# Patient Record
Sex: Female | Born: 1985 | Race: Black or African American | Hispanic: No | Marital: Married | Smoking: Former smoker
Health system: Southern US, Community
[De-identification: ages and names within clinical notes are randomized; demographics above are authoritative.]

## PROBLEM LIST (undated history)

## (undated) DIAGNOSIS — O365990 Maternal care for other known or suspected poor fetal growth, unspecified trimester, not applicable or unspecified: Secondary | ICD-10-CM

## (undated) DIAGNOSIS — D649 Anemia, unspecified: Secondary | ICD-10-CM

## (undated) DIAGNOSIS — I10 Essential (primary) hypertension: Secondary | ICD-10-CM

## (undated) DIAGNOSIS — D259 Leiomyoma of uterus, unspecified: Secondary | ICD-10-CM

## (undated) DIAGNOSIS — O139 Gestational [pregnancy-induced] hypertension without significant proteinuria, unspecified trimester: Secondary | ICD-10-CM

## (undated) HISTORY — DX: Gestational (pregnancy-induced) hypertension without significant proteinuria, unspecified trimester: O13.9

## (undated) HISTORY — DX: Anemia, unspecified: D64.9

## (undated) HISTORY — DX: Maternal care for other known or suspected poor fetal growth, unspecified trimester, not applicable or unspecified: O36.5990

## (undated) HISTORY — DX: Leiomyoma of uterus, unspecified: D25.9

## (undated) HISTORY — DX: Maternal care for benign tumor of corpus uteri, unspecified trimester: D25.9

---

## 2009-06-07 ENCOUNTER — Inpatient Hospital Stay (HOSPITAL_COMMUNITY): Admission: AD | Admit: 2009-06-07 | Discharge: 2009-06-17 | Payer: Self-pay | Admitting: Obstetrics and Gynecology

## 2009-06-08 ENCOUNTER — Encounter: Payer: Self-pay | Admitting: Obstetrics and Gynecology

## 2009-06-09 ENCOUNTER — Encounter: Payer: Self-pay | Admitting: Obstetrics and Gynecology

## 2009-06-10 ENCOUNTER — Encounter (INDEPENDENT_AMBULATORY_CARE_PROVIDER_SITE_OTHER): Payer: Self-pay | Admitting: Obstetrics and Gynecology

## 2010-02-01 ENCOUNTER — Emergency Department (HOSPITAL_COMMUNITY): Admission: EM | Admit: 2010-02-01 | Discharge: 2010-02-01 | Payer: Self-pay | Admitting: Family Medicine

## 2010-03-10 ENCOUNTER — Emergency Department (HOSPITAL_COMMUNITY): Admission: EM | Admit: 2010-03-10 | Discharge: 2010-03-10 | Payer: Self-pay | Admitting: Family Medicine

## 2010-03-11 ENCOUNTER — Inpatient Hospital Stay (HOSPITAL_COMMUNITY): Admission: EM | Admit: 2010-03-11 | Discharge: 2010-03-12 | Payer: Self-pay | Admitting: Emergency Medicine

## 2010-04-20 ENCOUNTER — Emergency Department (HOSPITAL_COMMUNITY): Admission: EM | Admit: 2010-04-20 | Discharge: 2010-04-20 | Payer: Self-pay | Admitting: Emergency Medicine

## 2011-02-07 LAB — MRSA PCR SCREENING: MRSA by PCR: NEGATIVE

## 2011-02-07 LAB — CBC
HCT: 27.4 % — ABNORMAL LOW (ref 36.0–46.0)
HCT: 34.8 % — ABNORMAL LOW (ref 36.0–46.0)
Hemoglobin: 11.4 g/dL — ABNORMAL LOW (ref 12.0–15.0)
Hemoglobin: 9.2 g/dL — ABNORMAL LOW (ref 12.0–15.0)
MCHC: 33.8 g/dL (ref 30.0–36.0)
MCV: 81.4 fL (ref 78.0–100.0)
MCV: 81.9 fL (ref 78.0–100.0)
Platelets: 232 10*3/uL (ref 150–400)
Platelets: 288 10*3/uL (ref 150–400)
RBC: 3.36 MIL/uL — ABNORMAL LOW (ref 3.87–5.11)
RBC: 4.24 MIL/uL (ref 3.87–5.11)
RDW: 16.8 % — ABNORMAL HIGH (ref 11.5–15.5)
RDW: 17.1 % — ABNORMAL HIGH (ref 11.5–15.5)
WBC: 11.1 10*3/uL — ABNORMAL HIGH (ref 4.0–10.5)
WBC: 4.7 10*3/uL (ref 4.0–10.5)

## 2011-02-07 LAB — DIFFERENTIAL
Basophils Absolute: 0 10*3/uL (ref 0.0–0.1)
Basophils Absolute: 0 10*3/uL (ref 0.0–0.1)
Basophils Relative: 1 % (ref 0–1)
Eosinophils Absolute: 0.1 10*3/uL (ref 0.0–0.7)
Eosinophils Relative: 1 % (ref 0–5)
Lymphocytes Relative: 35 % (ref 12–46)
Lymphs Abs: 1.7 10*3/uL (ref 0.7–4.0)
Lymphs Abs: 2.2 10*3/uL (ref 0.7–4.0)
Monocytes Absolute: 0.6 10*3/uL (ref 0.1–1.0)
Monocytes Absolute: 0.7 10*3/uL (ref 0.1–1.0)
Monocytes Relative: 13 % — ABNORMAL HIGH (ref 3–12)
Monocytes Relative: 8 % (ref 3–12)
Neutro Abs: 8.1 10*3/uL — ABNORMAL HIGH (ref 1.7–7.7)
Neutrophils Relative %: 51 % (ref 43–77)
Neutrophils Relative %: 70 % (ref 43–77)
Neutrophils Relative %: 73 % (ref 43–77)

## 2011-02-07 LAB — BASIC METABOLIC PANEL
GFR calc non Af Amer: >60 mL/min (ref >60)
Potassium: 3.2 mEq/L — ABNORMAL LOW (ref 3.5–5.1)
Sodium: 136 mEq/L (ref 135–145)

## 2011-02-07 LAB — GLUCOSE, CAPILLARY: Glucose-Capillary: 103 mg/dL — ABNORMAL HIGH (ref 70–99)

## 2011-02-07 LAB — RAPID STREP SCREEN (MED CTR MEBANE ONLY): Streptococcus, Group A Screen (Direct): NEGATIVE

## 2011-02-07 LAB — POCT RAPID STREP A (OFFICE): Streptococcus, Group A Screen (Direct): NEGATIVE

## 2011-02-08 ENCOUNTER — Inpatient Hospital Stay (HOSPITAL_COMMUNITY)
Admission: AD | Admit: 2011-02-08 | Discharge: 2011-02-10 | DRG: 886 | Disposition: A | Discharge status: Home / Self Care | Payer: BC Managed Care – PPO | Source: Ambulatory Visit | Attending: Obstetrics & Gynecology | Admitting: Obstetrics & Gynecology

## 2011-02-08 DIAGNOSIS — O139 Gestational [pregnancy-induced] hypertension without significant proteinuria, unspecified trimester: Principal | ICD-10-CM | POA: Diagnosis present

## 2011-02-08 DIAGNOSIS — O34219 Maternal care for unspecified type scar from previous cesarean delivery: Secondary | ICD-10-CM | POA: Diagnosis present

## 2011-02-08 LAB — CBC
HCT: 30.1 % — ABNORMAL LOW (ref 36.0–46.0)
MCHC: 32.2 g/dL (ref 30.0–36.0)
Platelets: 204 10*3/uL (ref 150–400)
RDW: 13.8 % (ref 11.5–15.5)

## 2011-02-08 LAB — COMPREHENSIVE METABOLIC PANEL
ALT: 9 U/L (ref 0–35)
BUN: 5 mg/dL — ABNORMAL LOW (ref 6–23)
Calcium: 8.7 mg/dL (ref 8.4–10.5)
Glucose, Bld: 112 mg/dL — ABNORMAL HIGH (ref 70–99)
Sodium: 136 mEq/L (ref 135–145)
Total Protein: 6.2 g/dL (ref 6.0–8.3)

## 2011-02-08 LAB — LACTATE DEHYDROGENASE: LDH: 136 U/L (ref 94–250)

## 2011-02-08 LAB — URINALYSIS, ROUTINE W REFLEX MICROSCOPIC
Glucose, UA: NEGATIVE mg/dL
Nitrite: NEGATIVE
Specific Gravity, Urine: 1.025 (ref 1.005–1.030)
pH: 7 (ref 5.0–8.0)

## 2011-02-08 LAB — URINE MICROSCOPIC-ADD ON

## 2011-02-08 LAB — URIC ACID: Uric Acid, Serum: 4.8 mg/dL (ref 2.4–7.0)

## 2011-02-09 ENCOUNTER — Encounter (HOSPITAL_COMMUNITY): Payer: Self-pay | Admitting: Radiology

## 2011-02-09 ENCOUNTER — Inpatient Hospital Stay (HOSPITAL_COMMUNITY): Payer: BC Managed Care – PPO

## 2011-02-10 LAB — CREATININE CLEARANCE, URINE, 24 HOUR
Collection Interval-CRCL: 24 hours
Creatinine, Urine: 176.9 mg/dL

## 2011-02-10 LAB — PROTEIN, URINE, 24 HOUR: Protein, 24H Urine: 192 mg/d — ABNORMAL HIGH (ref 50–100)

## 2011-02-14 ENCOUNTER — Ambulatory Visit (HOSPITAL_COMMUNITY)
Admit: 2011-02-14 | Discharge: 2011-02-14 | Disposition: A | Discharge status: Home / Self Care | Payer: Medicaid Other | Attending: Obstetrics & Gynecology | Admitting: Obstetrics & Gynecology

## 2011-02-14 ENCOUNTER — Ambulatory Visit (HOSPITAL_COMMUNITY): Payer: Medicaid Other

## 2011-02-14 DIAGNOSIS — Z8751 Personal history of pre-term labor: Secondary | ICD-10-CM | POA: Insufficient documentation

## 2011-02-14 DIAGNOSIS — O139 Gestational [pregnancy-induced] hypertension without significant proteinuria, unspecified trimester: Secondary | ICD-10-CM | POA: Insufficient documentation

## 2011-02-14 DIAGNOSIS — O09299 Supervision of pregnancy with other poor reproductive or obstetric history, unspecified trimester: Secondary | ICD-10-CM | POA: Insufficient documentation

## 2011-02-16 NOTE — H&P (Signed)
  NAME:  Danielle Short, Danielle Short             ACCOUNT NO.:  [id number]  MEDICAL RECORD NO.:  [id number]           PATIENT TYPE:  I  LOCATION:  9174                          FACILITY:  [facility]  PHYSICIAN:  Roseanna Rainbow, M.D.DATE OF BIRTH:  1986-10-12  DATE OF ADMISSION:  02/08/2011 DATE OF DISCHARGE:                             HISTORY & PHYSICAL   CHIEF COMPLAINT:  The patient is a 25 year old, para 1 with an intrauterine pregnancy at 30 plus weeks' complaining of elevated blood pressures.    HISTORY OF PRESENT ILLNESS:  The patient reports having an episode of scotomata.  Using a home blood pressure cuff, her blood pressures were in the 150s/100s range.  The scotomata were short-lived.  There were no other neurologic symptoms.  She notes some facial swelling as well as extremity swelling. She denies any history of hypertension.  PAST MEDICAL HISTORY:  She denies.  PAST SURGICAL HISTORY:  Cesarean delivery.  PAST OBSTETRICAL HISTORY:  Please see the above, that pregnancy was complicated by preeclampsia.  SOCIAL HISTORY:  She denies any tobacco, ethanol or drug use.  PAST GYNECOLOGIC HISTORY:  Noncontributory.  FAMILY HISTORY:  Noncontributory.  REVIEW OF SYSTEMS:  GI:  She denies epigastric pain.  PULMONARY:  She denies shortness of breath.  NEUROLOGIC:  Please see the above. CARDIOVASCULAR:  Please see the above.  PHYSICAL EXAMINATION:  VITAL SIGNS:  Blood pressures 130s-150s/80s-90s, heart rate 90s-100, respirations 18, temperature 99.1.  Fetal heart tracing 140, moderate long-term variability.  Tocodynamometer no uterine contractions.  Please see the physical exam as per the mid-level provider.  ASSESSMENT:  Intrauterine pregnancy at 30 plus weeks' with pregnancy- induced hypertension.  The fetal heart tracing is consistent with fetal well-being, category one fetal heart tracing.  PLAN:  Admission, bedrest, steroid, complete obstetrical ultrasound for growth, daily  weights, PIH panel, monitor for signs and symptoms and the criteria for severe PIH.     Roseanna Rainbow, M.D.     Danielle Short  D:  02/09/2011  T:  02/09/2011  Job:  [id number]  Electronically Signed by Antionette Char M.D. on 02/16/2011 07:12:31 PM

## 2011-02-26 LAB — CBC
HCT: 32.6 % — ABNORMAL LOW (ref 36.0–46.0)
HCT: 33.9 % — ABNORMAL LOW (ref 36.0–46.0)
HCT: 34.8 % — ABNORMAL LOW (ref 36.0–46.0)
HCT: 36.7 % (ref 36.0–46.0)
HCT: 38.4 % (ref 36.0–46.0)
Hemoglobin: 11.1 g/dL — ABNORMAL LOW (ref 12.0–15.0)
Hemoglobin: 11.4 g/dL — ABNORMAL LOW (ref 12.0–15.0)
Hemoglobin: 11.5 g/dL — ABNORMAL LOW (ref 12.0–15.0)
Hemoglobin: 11.6 g/dL — ABNORMAL LOW (ref 12.0–15.0)
Hemoglobin: 12.1 g/dL (ref 12.0–15.0)
Hemoglobin: 13 g/dL (ref 12.0–15.0)
MCHC: 33.1 g/dL (ref 30.0–36.0)
MCHC: 33.8 g/dL (ref 30.0–36.0)
MCHC: 33.9 g/dL (ref 30.0–36.0)
MCHC: 34.2 g/dL (ref 30.0–36.0)
MCHC: 34.5 g/dL (ref 30.0–36.0)
MCV: 95.5 fL (ref 78.0–100.0)
MCV: 96.2 fL (ref 78.0–100.0)
MCV: 96.6 fL (ref 78.0–100.0)
MCV: 97.8 fL (ref 78.0–100.0)
MCV: 98.1 fL (ref 78.0–100.0)
Platelets: 180 K/uL (ref 150–400)
Platelets: 189 K/uL (ref 150–400)
Platelets: 198 K/uL (ref 150–400)
Platelets: 200 10*3/uL (ref 150–400)
Platelets: 201 10*3/uL (ref 150–400)
RBC: 3.41 MIL/uL — ABNORMAL LOW (ref 3.87–5.11)
RBC: 3.52 MIL/uL — ABNORMAL LOW (ref 3.87–5.11)
RBC: 3.55 MIL/uL — ABNORMAL LOW (ref 3.87–5.11)
RBC: 3.98 MIL/uL (ref 3.87–5.11)
RDW: 14.1 % (ref 11.5–15.5)
RDW: 14.3 % (ref 11.5–15.5)
RDW: 14.7 % (ref 11.5–15.5)
RDW: 14.7 % (ref 11.5–15.5)
RDW: 15.2 % (ref 11.5–15.5)
RDW: 15.6 % — ABNORMAL HIGH (ref 11.5–15.5)
WBC: 10.2 K/uL (ref 4.0–10.5)
WBC: 12 10*3/uL — ABNORMAL HIGH (ref 4.0–10.5)
WBC: 12.8 10*3/uL — ABNORMAL HIGH (ref 4.0–10.5)
WBC: 7 K/uL (ref 4.0–10.5)
WBC: 7.1 K/uL (ref 4.0–10.5)

## 2011-02-26 LAB — COMPREHENSIVE METABOLIC PANEL
ALT: 21 U/L (ref 0–35)
ALT: 22 U/L (ref 0–35)
AST: 35 U/L (ref 0–37)
AST: 36 U/L (ref 0–37)
Albumin: 2.4 g/dL — ABNORMAL LOW (ref 3.5–5.2)
Albumin: 2.5 g/dL — ABNORMAL LOW (ref 3.5–5.2)
Alkaline Phosphatase: 116 U/L (ref 39–117)
Alkaline Phosphatase: 119 U/L — ABNORMAL HIGH (ref 39–117)
Alkaline Phosphatase: 119 U/L — ABNORMAL HIGH (ref 39–117)
Alkaline Phosphatase: 120 U/L — ABNORMAL HIGH (ref 39–117)
BUN: 10 mg/dL (ref 6–23)
BUN: 11 mg/dL (ref 6–23)
BUN: 7 mg/dL (ref 6–23)
BUN: 8 mg/dL (ref 6–23)
BUN: 9 mg/dL (ref 6–23)
CO2: 20 mEq/L (ref 19–32)
CO2: 25 mEq/L (ref 19–32)
CO2: 26 mEq/L (ref 19–32)
Calcium: 6.7 mg/dL — ABNORMAL LOW (ref 8.4–10.5)
Calcium: 7.2 mg/dL — ABNORMAL LOW (ref 8.4–10.5)
Chloride: 103 mEq/L (ref 96–112)
Chloride: 104 mEq/L (ref 96–112)
Chloride: 104 mEq/L (ref 96–112)
Chloride: 105 mEq/L (ref 96–112)
Creatinine, Ser: 0.74 mg/dL (ref 0.4–1.2)
Creatinine, Ser: 0.76 mg/dL (ref 0.4–1.2)
Creatinine, Ser: 0.77 mg/dL (ref 0.4–1.2)
GFR calc Af Amer: >60 mL/min (ref >60)
GFR calc Af Amer: >60 mL/min (ref >60)
GFR calc non Af Amer: >60 mL/min (ref >60)
GFR calc non Af Amer: >60 mL/min (ref >60)
Glucose, Bld: 113 mg/dL — ABNORMAL HIGH (ref 70–99)
Glucose, Bld: 117 mg/dL — ABNORMAL HIGH (ref 70–99)
Glucose, Bld: 120 mg/dL — ABNORMAL HIGH (ref 70–99)
Glucose, Bld: 137 mg/dL — ABNORMAL HIGH (ref 70–99)
Glucose, Bld: 87 mg/dL (ref 70–99)
Potassium: 3.9 mEq/L (ref 3.5–5.1)
Potassium: 4.2 mEq/L (ref 3.5–5.1)
Potassium: 4.4 mEq/L (ref 3.5–5.1)
Potassium: 4.9 mEq/L (ref 3.5–5.1)
Sodium: 135 mEq/L (ref 135–145)
Sodium: 137 mEq/L (ref 135–145)
Total Bilirubin: 0.4 mg/dL (ref 0.3–1.2)
Total Bilirubin: 0.4 mg/dL (ref 0.3–1.2)
Total Bilirubin: 0.5 mg/dL (ref 0.3–1.2)
Total Bilirubin: 0.7 mg/dL (ref 0.3–1.2)
Total Protein: 5.3 g/dL — ABNORMAL LOW (ref 6.0–8.3)
Total Protein: 6.2 g/dL (ref 6.0–8.3)
Total Protein: 6.4 g/dL (ref 6.0–8.3)

## 2011-02-26 LAB — URINALYSIS, ROUTINE W REFLEX MICROSCOPIC
Bilirubin Urine: NEGATIVE
Glucose, UA: NEGATIVE mg/dL
Hgb urine dipstick: NEGATIVE
Ketones, ur: NEGATIVE mg/dL
Leukocytes, UA: NEGATIVE
Nitrite: NEGATIVE
Protein, ur: 100 mg/dL — AB
Specific Gravity, Urine: 1.025 (ref 1.005–1.030)
Urobilinogen, UA: 0.2 mg/dL (ref 0.0–1.0)
pH: 6 (ref 5.0–8.0)

## 2011-02-26 LAB — MAGNESIUM
Magnesium: 4.7 mg/dL — ABNORMAL HIGH (ref 1.5–2.5)
Magnesium: 6.3 mg/dL (ref 1.5–2.5)
Magnesium: 8.3 mg/dL (ref 1.5–2.5)

## 2011-02-26 LAB — BASIC METABOLIC PANEL
BUN: 15 mg/dL (ref 6–23)
CO2: 27 mEq/L (ref 19–32)
CO2: 32 mEq/L (ref 19–32)
Calcium: 9.1 mg/dL (ref 8.4–10.5)
Chloride: 105 mEq/L (ref 96–112)
Chloride: 99 mEq/L (ref 96–112)
Creatinine, Ser: 0.66 mg/dL (ref 0.4–1.2)
Creatinine, Ser: 0.79 mg/dL (ref 0.4–1.2)
Glucose, Bld: 83 mg/dL (ref 70–99)
Glucose, Bld: 84 mg/dL (ref 70–99)
Potassium: 4.5 mEq/L (ref 3.5–5.1)

## 2011-02-26 LAB — URIC ACID
Uric Acid, Serum: 6.6 mg/dL (ref 2.4–7.0)
Uric Acid, Serum: 6.7 mg/dL (ref 2.4–7.0)
Uric Acid, Serum: 6.8 mg/dL (ref 2.4–7.0)
Uric Acid, Serum: 6.9 mg/dL (ref 2.4–7.0)
Uric Acid, Serum: 7 mg/dL (ref 2.4–7.0)
Uric Acid, Serum: 7.5 mg/dL — ABNORMAL HIGH (ref 2.4–7.0)

## 2011-02-26 LAB — CREATININE CLEARANCE, URINE, 24 HOUR
Collection Interval-CRCL: 24 h
Creatinine Clearance: 146 mL/min — ABNORMAL HIGH (ref 75–115)
Creatinine, 24H Ur: 1595 mg/d (ref 700–1800)
Creatinine, Urine: 43.4 mg/dL
Creatinine: 0.76 mg/dL (ref 0.40–1.20)
Urine Total Volume-CRCL: 3675 mL

## 2011-02-26 LAB — RPR: RPR Ser Ql: NONREACTIVE

## 2011-02-26 LAB — LACTATE DEHYDROGENASE
LDH: 171 U/L (ref 94–250)
LDH: 190 U/L (ref 94–250)
LDH: 249 U/L (ref 94–250)

## 2011-02-26 LAB — STREP B DNA PROBE: Strep Group B Ag: POSITIVE

## 2011-02-26 LAB — PROTEIN, URINE, 24 HOUR
Collection Interval-UPROT: 24 h
Protein, 24H Urine: 1139 mg/d — ABNORMAL HIGH (ref 50–100)
Protein, Urine: 31 mg/dL
Urine Total Volume-UPROT: 3675 mL

## 2011-02-26 LAB — URINE MICROSCOPIC-ADD ON

## 2011-03-06 ENCOUNTER — Inpatient Hospital Stay (HOSPITAL_COMMUNITY)
Admission: AD | Admit: 2011-03-06 | Discharge: 2011-03-07 | Disposition: A | Discharge status: Home / Self Care | Payer: Medicaid Other | Source: Ambulatory Visit | Attending: Obstetrics & Gynecology | Admitting: Obstetrics & Gynecology

## 2011-03-06 DIAGNOSIS — O4690 Antepartum hemorrhage, unspecified, unspecified trimester: Secondary | ICD-10-CM | POA: Insufficient documentation

## 2011-03-15 ENCOUNTER — Other Ambulatory Visit: Payer: Self-pay | Admitting: Obstetrics

## 2011-03-15 DIAGNOSIS — O139 Gestational [pregnancy-induced] hypertension without significant proteinuria, unspecified trimester: Secondary | ICD-10-CM

## 2011-03-21 ENCOUNTER — Ambulatory Visit (HOSPITAL_COMMUNITY)
Admission: RE | Admit: 2011-03-21 | Discharge: 2011-03-21 | Disposition: A | Discharge status: Home / Self Care | Payer: Medicaid Other | Source: Ambulatory Visit | Attending: Obstetrics | Admitting: Obstetrics

## 2011-03-21 ENCOUNTER — Other Ambulatory Visit: Payer: Self-pay | Admitting: Obstetrics

## 2011-03-21 ENCOUNTER — Other Ambulatory Visit (HOSPITAL_COMMUNITY): Payer: Medicaid Other

## 2011-03-21 ENCOUNTER — Inpatient Hospital Stay (HOSPITAL_COMMUNITY)
Admission: AD | Admit: 2011-03-21 | Discharge: 2011-03-27 | DRG: 766 | Disposition: A | Discharge status: Home / Self Care | Payer: Medicaid Other | Source: Ambulatory Visit | Attending: Obstetrics | Admitting: Obstetrics

## 2011-03-21 DIAGNOSIS — O34219 Maternal care for unspecified type scar from previous cesarean delivery: Secondary | ICD-10-CM

## 2011-03-21 DIAGNOSIS — O09299 Supervision of pregnancy with other poor reproductive or obstetric history, unspecified trimester: Secondary | ICD-10-CM | POA: Insufficient documentation

## 2011-03-21 DIAGNOSIS — O139 Gestational [pregnancy-induced] hypertension without significant proteinuria, unspecified trimester: Secondary | ICD-10-CM

## 2011-03-21 DIAGNOSIS — O1414 Severe pre-eclampsia complicating childbirth: Principal | ICD-10-CM | POA: Diagnosis present

## 2011-03-21 DIAGNOSIS — Z8751 Personal history of pre-term labor: Secondary | ICD-10-CM

## 2011-03-22 LAB — CBC
HCT: 32.9 % — ABNORMAL LOW (ref 36.0–46.0)
Hemoglobin: 10.3 g/dL — ABNORMAL LOW (ref 12.0–15.0)
MCHC: 31.3 g/dL (ref 30.0–36.0)
MCV: 85 fL (ref 78.0–100.0)
RDW: 16 % — ABNORMAL HIGH (ref 11.5–15.5)

## 2011-03-22 LAB — URINALYSIS, DIPSTICK ONLY
Glucose, UA: NEGATIVE mg/dL
Ketones, ur: NEGATIVE mg/dL
Leukocytes, UA: NEGATIVE
Nitrite: NEGATIVE
Protein, ur: 100 mg/dL — AB
Urobilinogen, UA: 0.2 mg/dL (ref 0.0–1.0)

## 2011-03-22 LAB — COMPREHENSIVE METABOLIC PANEL
ALT: 7 U/L (ref 0–35)
Alkaline Phosphatase: 165 U/L — ABNORMAL HIGH (ref 39–117)
BUN: 11 mg/dL (ref 6–23)
CO2: 24 mEq/L (ref 19–32)
Calcium: 9.4 mg/dL (ref 8.4–10.5)
GFR calc non Af Amer: >60 mL/min (ref >60)
Glucose, Bld: 88 mg/dL (ref 70–99)
Potassium: 4.4 mEq/L (ref 3.5–5.1)
Sodium: 135 mEq/L (ref 135–145)
Total Protein: 6.2 g/dL (ref 6.0–8.3)

## 2011-03-22 LAB — URIC ACID: Uric Acid, Serum: 6.2 mg/dL (ref 2.4–7.0)

## 2011-03-22 LAB — LACTATE DEHYDROGENASE: LDH: 179 U/L (ref 94–250)

## 2011-03-23 LAB — CREATININE CLEARANCE, URINE, 24 HOUR
Creatinine Clearance: 112 mL/min (ref 75–115)
Creatinine, 24H Ur: 1549 mg/d (ref 700–1800)
Creatinine, Urine: 54.94 mg/dL
Creatinine: 0.96 mg/dL (ref 0.4–1.2)

## 2011-03-23 LAB — PROTEIN, URINE, 24 HOUR: Urine Total Volume-UPROT: 2820 mL

## 2011-03-24 ENCOUNTER — Other Ambulatory Visit: Payer: Self-pay | Admitting: Obstetrics

## 2011-03-24 ENCOUNTER — Other Ambulatory Visit (HOSPITAL_COMMUNITY): Payer: Medicaid Other

## 2011-03-24 LAB — SAMPLE TO BLOOD BANK

## 2011-03-24 LAB — CBC
MCV: 84.4 fL (ref 78.0–100.0)
Platelets: 199 10*3/uL (ref 150–400)
RBC: 3.9 MIL/uL (ref 3.87–5.11)
RDW: 16.5 % — ABNORMAL HIGH (ref 11.5–15.5)
WBC: 7.7 10*3/uL (ref 4.0–10.5)

## 2011-03-25 LAB — CBC
MCV: 85 fL (ref 78.0–100.0)
Platelets: 192 10*3/uL (ref 150–400)
RBC: 3.41 MIL/uL — ABNORMAL LOW (ref 3.87–5.11)
RDW: 16.5 % — ABNORMAL HIGH (ref 11.5–15.5)
WBC: 7.2 10*3/uL (ref 4.0–10.5)

## 2011-03-26 LAB — RPR: RPR Ser Ql: NONREACTIVE

## 2011-03-28 ENCOUNTER — Other Ambulatory Visit (HOSPITAL_COMMUNITY): Payer: Medicaid Other

## 2011-03-28 ENCOUNTER — Inpatient Hospital Stay (HOSPITAL_COMMUNITY): Admission: RE | Admit: 2011-03-28 | Payer: Medicaid Other | Source: Ambulatory Visit

## 2011-03-28 ENCOUNTER — Ambulatory Visit (HOSPITAL_COMMUNITY): Payer: Medicaid Other

## 2011-03-30 NOTE — Discharge Summary (Signed)
NAME:  Danielle Short, Danielle Short             ACCOUNT NO.:  [id number]  MEDICAL RECORD NO.:  [id number]           PATIENT TYPE:  I  LOCATION:  9121                          [facility]ILITY:  [facility]  PHYSICIAN:  Aurielle Slingerland A. Clearance Coots, M.D.DATE OF BIRTH:  1985/12/30  DATE OF ADMISSION:  03/21/2011 DATE OF DISCHARGE:  03/27/2011                              DISCHARGE SUMMARY   ADMITTING DIAGNOSES: 1. 36 weeks' gestation. 2. Pregnancy-induced hypertension.  DISCHARGE DIAGNOSES: 1. 36 weeks' gestation. 2. Pregnancy-induced hypertension. 3. Superimposed preeclampsia, severe. 4. Status post primary low-transverse cesarean section on Mar 24, 2011,     for severe preeclampsia.  Viable female delivered at 12:19, Apgars     of 8 at one minute and 9 at five minutes, weight of 5 pounds 14     ounces.  Mother and infant discharged home in good condition.  REASON FOR ADMISSION:  A 25 year old G36, P1 estimated date of confinement Apr 14, 2011, presents with increased blood pressure.  Her sister had taken her blood pressure at home and the reading was 170/120. The patient is on Aldomet for elevated blood pressures during pregnancy and there is some question of compliance.  Previous obstetrical history is significant for delivery at 27 weeks with preeclampsia and she has had a therapeutic abortion, that 27-week delivery was by cesarean section.  The patient was not told that there are any contraindications to vaginal delivery with this birth  PAST MEDICAL HISTORY/SURGERY:  Cesarean section and therapeutic abortion.  ILLNESSES:  Anemia.  MEDICATIONS:  Prenatal vitamins and Aldomet.  ALLERGIES:  No known drug allergies.  SOCIAL HISTORY:  Single.  Negative tobacco, alcohol, or recreational drug use.  PHYSICAL EXAMINATION:  VITAL SIGNS:  Afebrile, vital signs were stable with blood pressures 160/130, repeat blood pressure 150/106. LUNGS:  Clear to auscultation bilaterally. HEART:  Regular rate and  rhythm. ABDOMEN:  Gravid, nontender. GU:  Cervix is long, closed, and vertex at -3 station.  ADMITTING LABS:  Hemoglobin 10, hematocrit 32, white blood cell count 6000, platelets 200,000.  Sodium 135, potassium 4.4, BUN 11, creatinine 0.97, SGOT 16, SGPT 7, uric acid 6.2, LDH 179.  HOSPITAL COURSE:  The patient was admitted and placed on magnesium sulfate for seizure prophylaxis.  Labetalol was started for blood pressure management.  The patient had labile blood pressures throughout her hospital course, which worsened by hospital day #3.  At this point, she was 37 weeks' gestation.  Maternal Fetal Medicine consultation was obtained, which recommended delivery if the 24-hour urine total protein was greater than 300.  The 24-hour urine results were concluded by hospital day #2 and the total protein over 24 hours was 328.  Per Maternal Fetal Medicine, Dr. Pamella Pert recommendation, decision was made to proceed with cesarean section delivery for severe preeclampsia at 37 weeks with unstable blood pressures.  Primary low-transverse cesarean section was performed on Mar 24, 2011.  There were no intraoperative complications.  Postoperative course was uncomplicated.  The patient did have anemia postoperatively, but her baseline hemoglobin was borderline anemic and she had no orthostatic changes of dizziness or lightheadedness or headaches with ambulation.  She was therefore started  on iron therapy.  The patient was discharged home on postop day #3 in good condition.  DISCHARGE LABS:  Hemoglobin 8.8, hematocrit 29, white blood cell count 7200, platelets 192,000.  DISCHARGE DISPOSITION:  Continue prenatal vitamins.  Percocet and ibuprofen was prescribed for pain.  Continue labetalol.  The patient is to follow up in the office in 3 days for reevaluation of blood pressure and incision removal of staples.     Torben Soloway A. Clearance Coots, M.D.     CAH/MEDQ  D:  03/27/2011  T:  03/27/2011  Job:   [id number]  Electronically Signed by Coral Ceo M.D. on 03/30/2011 09:23:18 AM

## 2011-03-30 NOTE — Op Note (Signed)
NAME:  Danielle Short, Danielle Short             ACCOUNT NO.:  [id number]  MEDICAL RECORD NO.:  [id number]           PATIENT TYPE:  I  LOCATION:  9159                          [facility]ILITY:  [facility]  PHYSICIAN:  Charles A. Clearance Coots, M.D.DATE OF BIRTH:  [birth year]  DATE OF PROCEDURE:  03/24/2011 DATE OF DISCHARGE:                              OPERATIVE REPORT   PREOPERATIVE DIAGNOSES: 1. 37 weeks' gestation. 2. Severe preeclampsia. 3. Previous cesarean section.  POSTOPERATIVE DIAGNOSES: 1. 37 weeks' gestation. 2. Severe preeclampsia. 3. Previous cesarean section.  PROCEDURE:  Repeat low-transverse cesarean section.  SURGEON:  Charles A. Clearance Coots, MD  ASSISTANT:  Roseanna Rainbow, MD  ANESTHESIA:  Spinal.  ESTIMATED BLOOD LOSS:  800 mL.  IV FLUIDS:  1900 mL.  URINE OUTPUT:  200 mL clear.  COMPLICATIONS:  None.  Foley to gravity.  FINDINGS:  Viable female at 12:19, Apgars of 8 at one minute and 9 at five minutes, weight of 5 pounds and 14 ounces.  Normal uterus, ovaries, and fallopian tubes.  SPECIMEN:  Placenta.  DISPOSITION:  Specimen to Pathology.  OPERATION:  The patient was brought to the operating room after satisfactory spinal anesthesia and with indwelling Foley catheter in place.  The abdomen was prepped and draped in usual sterile fashion.  A Pfannenstiel skin incision was made with a scalpel that was deepened down to the fascia with a scalpel.  The fascia was nicked in the midline and the fascial incision was extended to the left and then to the right with curved Mayo scissors.  The superior and inferior fascial edges were taken off the rectus muscles both with blunt and sharp dissection.  The rectus muscle was sharply divided in the midline being careful to avoid the urinary bladder inferiorly.  The peritoneum was grasped with Kocher forceps and was incised with scalpel.  The peritoneal incision was then extended superiorly and inferiorly with Metzenbaum scissors.  The  Alexis retractor was then placed in the incision.  The vesicouterine fold of peritoneum above the reflection of the urinary bladder was grasped with forceps and was incised and undermined with Metzenbaum scissors.  The incision was extended to left and to the right with Metzenbaum scissors. The bladder flap was then bluntly developed.  The uterus was then entered transversely in the lower uterine segment with the scalpel down to the amniotic sac.  The uterine incision was then extended to the left and to the right with the bandage scissors.  The amniotic sac was then ruptured with hemostat, clear amniotic fluid was expelled.  The vertex was noted to be hyperextended and could not be delivered easily with extension, with further flexion and fundal pressure, a Mityvac Mushroom cap was then placed on the occiput and the vertex was then flexed into the incision and delivered with one pull of the vacuum extractor and the aid of fundal pressure from the assistant.  The infant's mouth and nose were suctioned with a suction bulb and the delivery was completed with the aid of fundal pressure from the assistant.  Umbilical cord was doubly clamped and cut and the infant was handed off to the nursery  staff.  The placenta was then spontaneously expelled from the uterine cavity intact.  The endometrial surface was thoroughly debrided with a dry lap sponge.  The edges of the uterine incision were grasped with ring forceps and the uterus was closed with a continuous interlocking suture of 0-Monocryl.  Hemostasis was excellent.  Pelvic cavity was thoroughly irrigated with warm saline solution and all clots were removed.  The Alexis retractor was then removed.  The parietal peritoneum was grasped with Digeronimo forceps and the parietal peritoneum was closed with a continuous suture of 2-0 Monocryl.  The fascia was closed with a continuous suture of 0-Vicryl from each corner to the center.  Subcutaneous tissue  was thoroughly irrigated with warm saline solution and all areas of subcutaneous bleeding were coagulated with Bovie.  Skin was then closed with surgical stainless steel staples. Sterile bandage was applied to the incision in closure.  The surgical technician indicated that all needle, sponge, and instrument counts were correct x2.  The patient tolerated the procedure well, was transported to the recovery room in satisfactory condition.     Charles A. Clearance Coots, M.D.     CAH/MEDQ  D:  03/24/2011  T:  03/25/2011  Job:  [id number]  Electronically Signed by Coral Ceo M.D. on 03/30/2011 09:23:11 AM

## 2011-03-31 ENCOUNTER — Other Ambulatory Visit (HOSPITAL_COMMUNITY): Payer: Medicaid Other

## 2011-03-31 ENCOUNTER — Inpatient Hospital Stay (HOSPITAL_COMMUNITY)
Admission: AD | Admit: 2011-03-31 | Discharge: 2011-03-31 | Disposition: A | Discharge status: Home / Self Care | Payer: Medicaid Other | Source: Ambulatory Visit | Attending: Obstetrics | Admitting: Obstetrics

## 2011-03-31 DIAGNOSIS — O900 Disruption of cesarean delivery wound: Secondary | ICD-10-CM | POA: Insufficient documentation

## 2011-04-04 NOTE — Op Note (Signed)
NAME:  Danielle Short, Danielle Short             ACCOUNT NO.:  [id number]   MEDICAL RECORD NO.:  [id number]          PATIENT TYPE:  OUT   LOCATION:  MFM                           [facility]   PHYSICIAN:  Janine Limbo, M.D.DATE OF BIRTH:  [birth year]   DATE OF PROCEDURE:  06/10/2009  DATE OF DISCHARGE:                               OPERATIVE REPORT   PREOPERATIVE DIAGNOSES:  1. A 27-week and 2-day gestation.  2. Severe preeclampsia.   POSTOPERATIVE DIAGNOSES:  1. A 27-week and 2-day gestation.  2. Severe preeclampsia.  3. Fibroid uterus.   PROCEDURE:  Primary low transverse cesarean section.   SURGEON:  Janine Limbo, MD   FIRST ASSISTANT:  Renaldo Reel. Emilee Hero, CNM   ANESTHETIC:  Spinal.   DISPOSITION:  Danielle Short is a 25 year old female, gravida 1, para 0, who  was admitted to the [facility] on June 07, 2009.  At  the time of admission, she was 26 weeks and 5 days gestation.  She has  been followed at the [facility]  of [facility].  The patient developed severe  hypertension and then was noted to be severely preeclamptic.  She was  given 48 hours of betamethasone.  Her blood pressures were difficult to  control, but eventually the patient had reasonable blood pressures with  labetalol tablets and IV Apresoline.  However, on the morning of June 10, 2009, the patient was noted to have blood pressures that were more  difficult to control.  She was noted to have an elevation in her liver  enzymes.  The decision was made to proceed with delivery.  The patient  had had several contractions overnight and the contractions were  accompanied by late decelerations on the fetal heart monitor.  The  decision was made to recommend a primary cesarean delivery.  We reviewed  the indications for the surgical procedure with the patient and the  father of the baby.  We also discussed the risk associated with her  surgical procedure.  Those risks include, but are not limited to,  anesthetic complications, bleeding, infection, and possible damage to  the surrounding organs.   FINDINGS:  A 730-g female infant Danielle Short) was delivered from a  cephalic presentation.  The Apgar scores were 7 at 1 minute and 8 at 5  minutes.  The arterial cord blood pH was 7.32.  There was a 1-cm fibroid  present on the left lower uterine segment.  The fallopian tubes and the  ovaries appeared normal.  The placenta was fibrotic.   PROCEDURE IN DETAILS:  The patient was taken to the operating room where  a spinal anesthetic was given.  The patient's abdomen, perineum, and  vagina were prepped with multiple layers of Betadine.  The bladder was  drained with a Foley catheter.  The patient was then sterilely draped.  The lower abdomen was injected with 10 mL of 0.5% Marcaine with  epinephrine.  A low transverse incision was made in the abdomen and  carried sharply through the subcutaneous tissue, the fascia, and  the  anterior peritoneum.  An incision was made in the lower uterine segment  and the incision was extended in a low transverse fashion.  The lower  uterine segment was noted to be thickened.  The fetal head was delivered  without difficulty.  The mouth and nose were suctioned.  The remainder  of the infant was then delivered.  The cord was clamped and cut and the  infant was handed to the awaiting pediatric team.  Routine cord blood  studies were obtained.  The placenta was removed.  The uterine cavity  was cleaned of amniotic fluid, clotted blood, and membranes.  The  uterine incision was closed using a running locking suture of 2-0 Vicryl  followed by an imbricating suture of 2-0 Vicryl.  The pelvis was  vigorously irrigated.  Hemostasis was adequate.  The anterior peritoneum  and the abdominal musculature were reapproximated in the midline using 3-  0 Vicryl.  The fascia was closed using a running suture of 0  Vicryl,  followed by 3 interrupted sutures of 0 Vicryl.  Subcutaneous layer was  closed using a running suture of 3-0 Vicryl.  The skin was  reapproximated using a subcuticular suture of 3-0 Monocryl.  Sponge,  needle, and instrument counts were correct on 2 occasions.  The  estimated blood loss for the procedure was 800 mL.  The patient  tolerated her procedure well.  The patient was taken to the recovery  room and then to the Adult Intensive Care Unit for close monitoring.  The infant was taken to the Neonatal Intensive Care Unit for monitoring.  The placenta was sent to Pathology.      Janine Limbo, M.D.  Electronically Signed     AVS/MEDQ  D:  06/10/2009  T:  06/11/2009  Job:  [id number]

## 2011-04-04 NOTE — H&P (Signed)
NAME:  Danielle Short, Danielle Short             ACCOUNT NO.:  [id number]   MEDICAL RECORD NO.:  [id number]          PATIENT TYPE:  INP   LOCATION:  9152                          [facility]   PHYSICIAN:  Crist Fat. Rivard, M.D. DATE OF BIRTH:  [birth year]   DATE OF ADMISSION:  06/07/2009  DATE OF DISCHARGE:                              HISTORY & PHYSICAL   A 25 year old gravida 1, para 0 with [facility] September 08, 2009 presented from  the office for evaluation of elevated blood pressure.  Blood pressure is  130/90 in the office to 140/100.  The patient is 26-6/7 days.  Repeated  blood pressure in MAU was 140/100 and she was sent to MAU for workup and  now she is admitted.   LABORATORY DATA:  Hemoglobin 11.1, hematocrit 32.6, total protein 5.7,  AST, SGOT 2022; ALT, SGPT 9, uric acid 6.1, platelets 180, glucose 113,  alkaline phosphatase 116.   Blood pressure since admission 179/123, initially blood pressure  186/129, blood pressure 179/125, 199/129, 195/142.  She is A positive.  Antibody screen negative.  Sickle cell negative.  VDRL nonreactive.  Rubella immune.  HBsAg negative.  HIV negative.  Pap within normal  limits.  GC, CT negative.   PAST MEDICAL HISTORY:  Denies diabetes, asthma, epilepsy, heart disease.  She does have history of Chlamydia.   ALLERGIES:  No known drug allergies.   PAST SURGICAL HISTORY:  Negative.   PAST OB HISTORY:  Menses 25 years old, every 28 days, lasting 5-6 days.  Her positive pregnancy test was January 01, 2009.   SOCIAL HISTORY:  She is single.  Denies drugs, alcohol.  Works in [employer]  and she stopped smoking this pregnancy.   FAMILY HISTORY:  Hypertension.  Her mother, her maternal aunt all are  diabetic.  Hypertensive diabetes in mother, also on dialysis three times  a week, and has sickle cell trait.   PHYSICAL EXAMINATION:  VITAL SIGNS:  Blood pressure 199/125 to 179/123.  HEART:  Regular rate without murmur.  LUNG:  Clear bilaterally.  ABDOMEN:   Gravid.  Fetal heart tone 130-140, 140-150 variably minimal.  No accelerations due to gestational age.  GENITOURINARY:  Vaginal exam, did not check.  EXTREMITIES:  The patient's reflexes +2.  No edema.  Noted 2 beats of  clonus.   ASSESSMENT:  1. Intrauterine pregnancy at 26-6/7.  2. Severe preeclampsia, admit to Dr. Estanislado Pandy.  Begin magnesium sulfate      4 g bolus and 2 g / hour maintenance.  3. Labetalol 20 mg IV push with order.  4. Betamethasone 12.5, repeat in 24 hours, Foley to bedside drainage,      Dr. Estanislado Pandy notified of admission and orders received and at 7:15      Dr. Estanislado Pandy is in MAU evaluating the patient.      Jasmine Awe, CNM      Dois Davenport A. Rivard, M.D.  Electronically Signed    JM/MEDQ  D:  06/07/2009  T:  06/08/2009  Job:  [id number]

## 2011-04-07 NOTE — Discharge Summary (Signed)
NAME:  Danielle Short, Danielle Short             ACCOUNT NO.:  [id number]   MEDICAL RECORD NO.:  [id number]          PATIENT TYPE:  INP   LOCATION:  9305                          FACILITY:  [facility]   PHYSICIAN:  Danielle Short, M.D.DATE OF BIRTH:  1986-02-08   DATE OF ADMISSION:  06/07/2009  DATE OF DISCHARGE:  06/17/2009                               DISCHARGE SUMMARY   ADMITTING DIAGNOSES:  1. Intrauterine pregnancy at 66 and 6/7 weeks.  2. Severe preeclampsia.  3. Life stressors.   DISCHARGE DIAGNOSES:  1. Intrauterine pregnancy at 61 and 2/7 weeks.  2. Preterm delivery at 27 and 2/7 weeks secondary to worsening      preeclampsia.  3. Status post a primary low transverse cesarean section with findings      of a viable female infant born on June 10, 2007 11:07 a.m., female      named, Danielle Short, with Apgars of 7 at 1 minute and 8 at 5      minutes, and cord pH equal to 7.32.  4. GBS positive.  5. Fibroid uterus.  6. Oligohydramnios diagnosed on June 07, 2009.  7. Postpartum hypertension.  8. Continued life stressors.   HOSPITAL PROCEDURES:  1. Magnesium sulfate infusion.  2. Hypertension management.  3. Betamethasone course.  4. Primary low transverse cesarean section.  5. Spinal anesthesia.  6. IV Lasix for diuresis.   CONSULTATIONS:  1. Maternal Fetal Medicine.  2. Nutrition.  3. NICU consult.  4. Social Work.  5. Lactation.  6. Pharmacy.   HOSPITAL COURSE:  Ms. Danielle Short is a 25 year old gravida 1, para 0, who  was sent from the office to [facility] for evaluation of  elevated blood pressures.  She was 26 and 6/7 weeks per an [facility] of  September 08, 2009, blood pressure at the office had been 130/90 as well  as 140/100.  She was without any other PIH signs or symptoms, and after  arriving from the office, her initial blood pressure in MAU was 140/100,  and the following that, blood pressure 199/125 and 179/123.  Following  that, blood pressure 186/129, 179/125,  199/129, and 195/142.  While in  maternity admission, she did have PIH labs drawn, hemoglobin was equal  to 11.1, hematocrit 32.6.,  uric acid was 6.1, and platelets were 180.  SGOT was 22 and SGPT was 9.  Fetal heart rate was 130s-140s, minimal  variability reassuring, but no reactivity.  Pelvic exam was deferred.  She did not have any edema.  She did have 2 beats of clonus bilaterally  and DTRs were slightly hyper reflexes.  Following her initial  evaluation, Dr. Estanislado Short was consulted, and the evening of July 19, the  patient was admitted as inpatient status for severe preeclampsia and  started on a magnesium sulfate infusion.  She did receive a 4 g bolus as  well as 2 g IV.  She was noted to have 3+ protein in her urine.  She  complained of a headache that was 2/10.  Reported good fetal movement.  Denied any epigastric pain.  UA did show 100 mg of  protein.  The patient  was given some IV labetalol to maintain some blood pressures below160  systolics and diastolics below 105.  Consult was made with Dr. Rica Short regarding the patient's status.  The patient did receive a NICU  consult on that day and continuous monitoring.  She did begin  betamethasone series, first dose was given July 19, and second dose on  July 20,.  Her initial ultrasound on the 19th did have SIUP with  cephalic presentation, anterior placenta above os.  AFI did show  oligohydramnios with an AFI summary equal to 9.32 cm, which was 4th  percentile, did have an 8/8 BPP, estimated fetal weight was 1 pound 11  ounces, which did show a growth lag 21st percentile and that was 756 g,  did have slightly elevated umbilical artery Doppler greater than 97.5th  percentile, ST ratio was 4.9.  There were a few samples having absent  end-diastolic flow during ultrasound and ST ratio did average out to the  4.9.  Her cervical length was 4.2 cm and without abnormalities.  Later  on the evening of the 19th, blood pressure had  decreased to 155/112.  Around 9:15 p.m., she did have good urinary output and plan was made to  give IV hydralazine at that point.  On the morning of July 20, the  patient was denying any PIH signs or symptoms.  She did have family at  bedside.  The patient's mother at that time was on standby for kidney  transplant.  Early morning blood pressure was 147/97.  The patient  continued on her magnesium, blood pressure range was 128-158 over 79-  122.  Otherwise, her vital signs were stable.  She received a second  dose of betamethasone that morning.  Fetal heart rate was reassuring  occasional mild variable.  Lungs were clear.  Reflexes were 2+ and no  clonus at that time.  On the 20th, SGOT was up to 24 and SGPT had gone  up to 11, the previous day it had been 22 and 9 respectively.  Magnesium  level was 6.3, hemoglobin was 13, uric acid was 6.9, LDH was 171, 24-  hour urinary output had been good by 100-325 mL/hour.  Plan was made to  repeat her ultrasound on the 20th and await 24-hour urine results that  did result in a total protein of 1139.  Ultrasound on the 20th with did  show slight drop in fetal AFI went down to 7.3, which was less than 3rd  percentile.  BPP remained 8/8 at that time there was no absent or  revered flow and cervix continued to appear close.  Fetal heart rate  baseline was in the 130s with occasional mild variable and was overall  reassuring.  At 5 p.m. on the 20th, blood pressures had been ranging  since the morning 126-153/80-96.  Her maximum that day was 160/105 at 10  a.m.  On July 21, which was 27 and 1/7 weeks.  The patient continued  denied any PIH signs or symptoms.  Reported good fetal movement.  Morning blood pressure was 145/104.  Urinary output was approximately  200 mL/hour.  She had no edema, still slightly hyperreflexic with 1 beat  of clonus on the left, 2 beats on the right per Dr. Estanislado Short, fetal heart  rate was in the 130s, decreased variability, but  no decelerations.  Liver function test continued to climb.  SGOT was up to 31 and SGPT was  17.  Uric acid was stable at 6.8, platelets had increased just slightly,  hemoglobin was 12.1.  Dr. Estanislado Short again consulted with Maternal Fetal  Medicine, and on the 21st, the patient was started per recommendation on  labetalol 200 mg p.o. b.i.d., her magnesium sulfate infusion did  continue.  She did receive another ultrasound on the 21st to reassess  fluid level and AFI had gone up slightly to 9.43 cm, which was 5th  percentile, again BPP was 8/8 and had normal umbilical artery Dopplers.  The night of the 21st, the patient was starting to feel a little  lightheaded with magnesium was overwhelmed with life stressors.  Her  mother had received her kidney transplant was less than 24 hours at that  point.  Relationship issues were also confirmed with father of the baby.  She did not have any PIH signs or symptoms, adequate urinary output was  noted, blood pressure range that night was 140-150 systolics over  diastolics of 105-114.  She had just received some IV labetalol and was  still continuing on her p.o. labetalol as well.  Still she did have 2  beats of clonus.  DTRs were still hyper reflexes.  Plan was made to  repeat her PIH labs just after midnight as well as a.m. on the 22nd.  The patient did have to receive some IV hydralazine as well during the  night of the July 21 and into the 22nd.  Did complain of some GERD,  which she did receive some IV Protonix with good relief.  At 0045, on  July 22, SGOT had gone up to 35 and SGPT was up to 21.  Her labs were  repeated at 5:10 a.m. on July 22 and AST was up to 39 and ALT was up to  24.  Blood pressures at 6 a.m. had been noted since her IV hydralazine  to be 120s-130s/80s.  On the morning of 22nd, Dr. Katrinka Blazing did review the  patient's status as well as continuing elevations in liver function test  as well as need for continued IV antihypertensive  management as well as  the complaints of epigastric pain, and following her assessment, she did  recommend delivery due to symptoms and blood pressure still requiring IV  meds as well as the continued elevations in LFTs.  Around 8:45 a.m. on  the 22nd, C-section was recommended by Dr. Stefano Gaul as well as Dr. Katrinka Blazing  related to her preeclampsia.  After risks, benefits, and alternatives  were discussed with the patient, she was agreeable to proceed with  delivery.  The patient did have some fetal heart rate decelerations  through the night, 3 late D cells were noted with decreased variability  as well.  The patient following discussions with both Dr. Stefano Gaul and  Dr. Katrinka Blazing was prepped for the OR and a primary low transverse cesarean  section was performed on June 10, 2009 by Dr. Lafayette Dragon stringer,  assisted by Nigel Bridgeman, certified nurse midwife.  The procedure was  performed under spinal anesthesia and findings were a viable female infant  Danielle Short born on June 10, 2007 at 11:07 a.m.  Apgars were 7 at 1  minute and 8 at 5 minutes.  PH cord 7.32 and delivery was done at 27 and  2/7 weeks secondary to severe preeclampsia.  Female infant was transferred  to NICU following delivery, where he will remain secondary to  prematurity and the patient did tolerate procedure well and she was  taken to  PACU in stable condition, following which she was transferred  to [facility] for continued magnesium sulfate therapy.  On July 23, at 9 a.m.  postoperative day #1, the patient was doing well, was without any PIH  signs or symptoms.  No dizziness with weightbearing.  She was afebrile.  Her blood pressures were 147/105 and 155/110.  She had still been on  hydralazine IV p.r.n.  Overnight, her blood pressures had been 130s-  40s/90s.  As soon as serum magnesium level drawn, which was 8.3.  Her  magnesium was stopped between 6:30 to 7:30 a.m. and then restarted at 1  g per hour.  On a 23rd, white count was  11.1, prior day had been 12,  hemoglobin was stable at 11.6 and had been 12.5 the prior day.  Platelets were 201 and had been 200 the prior day.  SGOT was 35 on  postoperative day #1 previously 36, SGPT was 22 and had been previously  23.  Uric acid was 7.5 and had been the day before 6.7.  Her weight was  139.5, previously had been 139.  She not yet had any significant  diuresis.  On postoperative day #1 by the early evening, her magnesium  level had been turned back up to 2 g per hour just before supper time.  On postoperative day #2, July 24, the patient was doing well.  Her pain  was well controlled.  She was ambulating to bathroom without weakness or  dizziness.  She denied any PIH signs or symptoms, was tolerating her  diet without difficulty, positive flatulence, positive bowel movement.  No chest pain or shortness of breath.  Blood pressure range had been 132-  160 systolics, diastolic 77-107.  Most recent blood pressures had been  in the morning of a 24th, one upper 140s-150s over upper 80s-90s.   Her physical exam was within normal limits.  Incision was without signs  or symptoms of infection.  She had lochia rubra.  By around lunch time  on the 24th, Dr. Pennie Rushing did order for the patient to receive IV Lasix  40 mg to promote diuresis.  The patient's weight on the 24th was 149.5  and on the 23rd had been 139.5.  Her urinary output had ranged 67 to 350  mL per hour since midnight.  Plan was also made the night of the 24th  for the patient to begin p.o. Procardia to enhance blood pressure  management.  She received some plain Procardia on the night of 24 and  then on the 25th was to begin Procardia XL.  The patient was doing well  with her pumping on postoperative day #3, which was July 25.  She had  minimal pain, was still doing well, did have some off and on headaches,  bleeding was light.  Blood pressures on morning of the 25th had been  143/104, 137/97, and 141/93.  Weight  that day was 134 and as I mentioned  previous the day been 149.  Her physical exam remained within normal  limits.  She does have some trace bilateral lower extremity edema.  The  prior day it had been approximately the same.  On the 25th, DTRs were 2+  that were slightly brisk, but negative Homans sign.  She still had some  clonus.  Later on the evening of 25th, the patient continued to have  some blood pressure elevations.  She was transferred to the floor on the  24th as well as having her  magnesium discontinued and after having this  blood pressure elevations, it was decided that the patient should  remained hospitalized until further stable.  On July 26, the patient was  without any PIH symptoms, still blood pressure was 140s-170s/ 90s-120s.  Physical exam was within normal limits.  DTRs were 2+ bilaterally.  No  clonus.  At that time, Dr. Normand Sloop did note that the patient had stated  verbally that she had been unable to swallow the pills and had been  chewing the Procardia.  The morning of the 25th had been started on  Procardia XL.  Pharmacy was consulted and the patient was started on  some IV labetalol to manage the hypertension at the moment and as the  day progress to get started on p.o. metoprolol.  She continued to have  to receive as the 26 progress intermittent IV labetalol to control her  blood pressures.  The night of the 26th, blood pressure was 160/112.  On  the 27th, she continued doing well, however, blood pressure still  remained elevated.  There were running 140s and 150s/100s and low one-  teens.  Physical exam remained within normal limits.  Her metoprolol  liquid suspension was increased to maximum dose b.i.d.  She did have to  continue receiving IV labetalol intermittently to control blood  pressures, max on the night of the 27th was 171/107.  On the 28th, the  patient did have Norvasc p.o. added and was a chewable tablet that could  be crushed.  The patient  continued doing well.  She complained of some  visual disturbances when she was output, but none otherwise.  She was  having good output from her pumping, was up ad lib.  She was voiding  without difficulty.  Bowel movements without difficulty.  Primarily  Motrin for pain.  Status post her C-section, very little vaginal bleeding.  She reported that her swelling and edema was almost  completely resolved.  Blood pressure on the morning of 28 was 149/104.  Otherwise, her vital signs were stable and she was afebrile.  Blood  pressure range had been 141-171/83-110.  She did receive IV labetalol at  10 p.m. for the blood pressure 171/107 the night before on the 27th.  Since that time her blood pressures had been 150-158/100-103.  Her  physical exam was remained within normal limits.  She did not have any  clonus, no edema.  DTRs were 2+ and did continue to observe blood  pressures during the day of the 28th and as I mentioned her Norvasc 10  mg p.o. daily was started.  On July 29, which was postoperative day #7,  the patient was still without complaints and was doing very well.  She  was afebrile.  Her blood pressure that morning was 144/99 that was 1  hour after the increase in the dosage of her Norvasc to 10 mg p.o. Blood  pressure continued to improve in range 130-154/93-100 overnight, and on  the morning of postoperative day #7 which was July 29, the patient was  deemed to have received full benefit of her hospital stay and was  discharged home in stable condition.   DISCHARGE MEDICATIONS:  1. Vitamin 2 tablets p.o. daily.  2. Norvasc 10 mg p.o. daily  3. Metoprolol 100 mg suspension p.o. b.i.d.  4. Motrin suspension 600 mg p.o. q.6 h. p.r.n. pain.   DISCHARGE INSTRUCTIONS:  Per [facility] OB pamphlet.  PIH precautions were  reviewed as well as warning signs  and symptoms report.  Follow up was to  occur in 6 weeks or p.r.n.  She was also arranged to have a [employer]  star[employer] to follow up with  blood pressure on August 2 and results were  to be called to Dr. Pennie Rushing on that day.  Support was given regarding  newborn female with continued NICU stay necessitated.      Candice [place], [place]      Danielle Short, M.D.  Electronically Signed    CHS/MEDQ  D:  07/11/2009  T:  07/12/2009  Job:  [id number]

## 2011-04-14 ENCOUNTER — Inpatient Hospital Stay (HOSPITAL_COMMUNITY): Admission: AD | Admit: 2011-04-14 | Payer: Self-pay | Admitting: Obstetrics

## 2012-04-19 ENCOUNTER — Emergency Department (INDEPENDENT_AMBULATORY_CARE_PROVIDER_SITE_OTHER)
Admission: EM | Admit: 2012-04-19 | Discharge: 2012-04-19 | Disposition: A | Discharge status: Home Health | Payer: BC Managed Care – PPO | Source: Home / Self Care | Attending: Family Medicine | Admitting: Family Medicine

## 2012-04-19 ENCOUNTER — Encounter (HOSPITAL_COMMUNITY): Payer: Self-pay

## 2012-04-19 DIAGNOSIS — J039 Acute tonsillitis, unspecified: Secondary | ICD-10-CM

## 2012-04-19 MED ORDER — CEPHALEXIN 500 MG PO CAPS: 500.0000 mg | ORAL_CAPSULE | Freq: Two times a day (BID) | ORAL | Status: AC

## 2012-04-19 MED ORDER — IBUPROFEN 600 MG PO TABS: 600.0000 mg | ORAL_TABLET | Freq: Three times a day (TID) | ORAL | Status: AC

## 2012-04-19 MED ORDER — ACETAMINOPHEN-CODEINE #3 300-30 MG PO TABS: 1.0000 | ORAL_TABLET | Freq: Four times a day (QID) | ORAL | Status: AC | PRN

## 2012-04-19 NOTE — ED Notes (Signed)
Pt c/o R sided sore throat.  Onset 3 days ago.  Pt states she has been using Catering manager with no relief.

## 2012-04-19 NOTE — Discharge Instructions (Signed)
Tonsillitis Tonsils are lumps of lymphoid tissues at the back of the throat. Each tonsil has 20 crevices (crypts). Tonsils help fight nose and throat infections and keep infection from spreading to other parts of the body for the first 18 months of life. Tonsillitis is an infection of the throat that causes the tonsils to become red, tender, and swollen. CAUSES Sudden and, if treated, temporary (acute) tonsillitis is usually caused by infection with streptococcal bacteria. Long lasting (chronic) tonsillitis occurs when the crypts of the tonsils become filled with pieces of food and bacteria, which makes it easy for the tonsils to become constantly infected. SYMPTOMS  Symptoms of tonsillitis include:  A sore throat.   White patches on the tonsils.   Fever.   Tiredness.  DIAGNOSIS Tonsillitis can be diagnosed through a physical exam. Diagnosis can be confirmed with the results of lab tests, including a throat culture. TREATMENT  The goals of tonsillitis treatment include the reduction of the severity and duration of symptoms, prevention of associated conditions, and prevention of disease transmission. Tonsillitis caused by bacteria can be treated with antibiotics. Usually, treatment with antibiotics is started before the cause of the tonsillitis is known. However, if it is determined that the cause is not bacterial, antibiotics will not treat the tonsillitis. If attacks of tonsillitis are severe and frequent, your caregiver may recommend surgery to remove the tonsils (tonsillectomy). HOME CARE INSTRUCTIONS   Rest as much as possible and get plenty of sleep.   Drink plenty of fluids. While the throat is very sore, eat soft foods or liquids, such as sherbet, soups, or instant breakfast drinks.   Eat frozen ice pops.   Older children and adults may gargle with a warm or cold liquid to help soothe the throat. Mix 1 teaspoon of salt in 1 cup of water.   Other family members who also develop a  sore throat or fever should have a medical exam or throat culture.   Only take over-the-counter or prescription medicines for pain, discomfort, or fever as directed by your caregiver.   If you are given antibiotics, take them as directed. Finish them even if you start to feel better.  SEEK MEDICAL CARE IF:   Your baby is older than 3 months with a rectal temperature of 100.5 F (38.1 C) or higher for more than 1 day.   Large, tender lumps develop in your neck.   A rash develops.   Green, yellow-brown, or bloody substance is coughed up.   You are unable to swallow liquids or food for 24 hours.   Your child is unable to swallow food or liquids for 12 hours.  SEEK IMMEDIATE MEDICAL CARE IF:   You develop any new symptoms such as vomiting, severe headache, stiff neck, chest pain, or trouble breathing or swallowing.   You have severe throat pain along with drooling or voice changes.   You have severe pain, unrelieved with recommended medications.   You are unable to fully open the mouth.   You develop redness, swelling, or severe pain anywhere in the neck.   You have a fever.   Your baby is older than 3 months with a rectal temperature of 102 F (38.9 C) or higher.   Your baby is 12 months old or younger with a rectal temperature of 100.4 F (38 C) or higher.  MAKE SURE YOU:   Understand these instructions.   Will watch your condition.   Will get help right away if you are not  watch your condition.   Will get help right away if you are not doing well or get worse.  Document Released: 08/16/2005 Document Revised: 10/26/2011 Document Reviewed: 01/12/2011  [employer] Patient Information 2012 [employer].

## 2012-04-22 NOTE — ED Provider Notes (Signed)
History     CSN: [id number]  Arrival date & time 04/19/12  1826   First MD Initiated Contact with Patient 04/19/12 1832      Chief Complaint  Patient presents with  . Sore Throat    (Consider location/radiation/quality/duration/timing/severity/associated sxs/prior treatment) HPI Comments: 26 year old female with no significant past medical history here complaining of 3 days with sore throat. Pain with swallowing. No significant cough or congestion. No abdominal pain. No rash. Subjective fever.    History reviewed. No pertinent past medical history.  Past Surgical History  Procedure Date  . Cesarean section     No family history on file.  History  Substance Use Topics  . Smoking status: Not on file  . Smokeless tobacco: Not on file  . Alcohol Use: No    OB History    Grav Para Term Preterm Abortions TAB SAB Ect Mult Living   1               Review of Systems  Constitutional: Positive for fever and chills. Negative for diaphoresis.  HENT: Positive for sore throat and trouble swallowing. Negative for congestion, rhinorrhea, neck pain, neck stiffness and sinus pressure.   Respiratory: Negative for cough, shortness of breath and wheezing.   Gastrointestinal: Negative for nausea, vomiting, abdominal pain and diarrhea.  Musculoskeletal: Negative for myalgias, joint swelling and arthralgias.  Skin: Negative for rash.  Neurological: Positive for headaches.    Allergies  Review of patient's allergies indicates no known allergies.  Home Medications   Current Outpatient Rx  Name Route Sig Dispense Refill  . ACETAMINOPHEN-CODEINE #3 300-30 MG PO TABS Oral Take 1-2 tablets by mouth every 6 (six) hours as needed for pain. 15 tablet 0  . CEPHALEXIN 500 MG PO CAPS Oral Take 1 capsule (500 mg total) by mouth 2 (two) times daily. 20 capsule 0  . IBUPROFEN 600 MG PO TABS Oral Take 1 tablet (600 mg total) by mouth 3 (three) times daily. 30 tablet 0    BP 136/96  Pulse 84   Temp(Src) 98.5 F (36.9 C) (Oral)  Resp 18  SpO2 100%  LMP 04/19/2012  Breastfeeding? Unknown  Physical Exam  Nursing note and vitals reviewed. Constitutional: She is oriented to person, place, and time. She appears well-developed and well-nourished. No distress.  HENT:  Right Ear: External ear normal.  Left Ear: External ear normal.       Nose normal Significant pharyngeal erythema right tonsil also with white/gray exudate and swelling. No uvula deviation. No trismus. TM's normal  Eyes: Conjunctivae and EOM are normal. Pupils are equal, round, and reactive to light. Right eye exhibits no discharge. Left eye exhibits no discharge.  Neck: Normal range of motion. Neck supple.  Cardiovascular: Normal rate, regular rhythm and normal heart sounds.  Exam reveals no gallop and no friction rub.   No murmur heard. Abdominal: Soft. There is no tenderness.       No hepatosplenomegaly.  Lymphadenopathy:    She has cervical adenopathy.  Neurological: She is alert and oriented to person, place, and time.  Skin: No rash noted.    ED Course  Procedures (including critical care time)  Labs Reviewed - No data to display No results found.   1. Tonsillitis       MDM  Exudative tonsillitis treated with keflex. Ibuprofen. Return if worsening or persistent symptoms despite following treatment.        Sharin Grave, MD 04/22/12 1115

## 2012-05-25 ENCOUNTER — Encounter (HOSPITAL_COMMUNITY): Payer: Self-pay | Admitting: Emergency Medicine

## 2012-05-25 ENCOUNTER — Emergency Department (HOSPITAL_COMMUNITY)
Admission: EM | Admit: 2012-05-25 | Discharge: 2012-05-25 | Disposition: A | Discharge status: Home / Self Care | Payer: BC Managed Care – PPO | Attending: Emergency Medicine | Admitting: Emergency Medicine

## 2012-05-25 DIAGNOSIS — J029 Acute pharyngitis, unspecified: Secondary | ICD-10-CM

## 2012-05-25 DIAGNOSIS — B9789 Other viral agents as the cause of diseases classified elsewhere: Secondary | ICD-10-CM | POA: Insufficient documentation

## 2012-05-25 HISTORY — DX: Essential (primary) hypertension: I10

## 2012-05-25 MED ORDER — OXYCODONE-ACETAMINOPHEN 5-325 MG PO TABS: 1.0000 | ORAL_TABLET | ORAL | Status: AC | PRN

## 2012-05-25 MED ORDER — DEXAMETHASONE SODIUM PHOSPHATE 10 MG/ML IJ SOLN
10.0000 mg | Freq: Once | INTRAMUSCULAR | Status: AC
  Administered 2012-05-25: 10 mg via INTRAMUSCULAR
  Filled 2012-05-25: qty 1

## 2012-05-25 NOTE — ED Notes (Signed)
Pt c/o sore throat onset Thursday. Pt has taken alka seltzer over the counter without relief. Pt denies N/V or fever.

## 2012-05-25 NOTE — ED Provider Notes (Signed)
History     CSN: [id number]  Arrival date & time 05/25/12  1247   First MD Initiated Contact with Patient 05/25/12 1356      Chief Complaint  Patient presents with  . Sore Throat    (Consider location/radiation/quality/duration/timing/severity/associated sxs/prior treatment) Patient is a 26 y.o. female presenting with pharyngitis. The history is provided by the patient.  Sore Throat  She has had a sore throat for the last 2 days pain is worse with. She's not had any fever, chills, sweats. Rhinorrhea or cough or vomiting or diarrhea. Of note, been to urgent care about a month ago for similar illness and took an antibiotic for 10 days but symptoms recurred after completing the antibiotic. She took a dose of ibuprofen with partial relief of pain. Pain is currently rated at 4/10, but has been as severe as 10/10.  Past Medical History  Diagnosis Date  . Hypertension during pregnancy    Past Surgical History  Procedure Date  . Cesarean section     History reviewed. No pertinent family history.  History  Substance Use Topics  . Smoking status: Never Smoker   . Smokeless tobacco: Not on file  . Alcohol Use: No    OB History    Grav Para Term Preterm Abortions TAB SAB Ect Mult Living   1               Review of Systems  All other systems reviewed and are negative.    Allergies  Review of patient's allergies indicates no known allergies.  Home Medications   Current Outpatient Rx  Name Route Sig Dispense Refill  . IBUPROFEN 200 MG PO TABS Oral Take 400 mg by mouth every 6 (six) hours as needed. For pain    . OXYCODONE-ACETAMINOPHEN 5-325 MG PO TABS Oral Take 1 tablet by mouth every 4 (four) hours as needed for pain. 12 tablet 0    BP 146/103  Pulse 129  Temp 99.3 F (37.4 C) (Oral)  Resp 18  SpO2 100%  LMP 05/23/2012  Physical Exam  Nursing note and vitals reviewed. -year-old female who is resting comfortably and in no acute distress. Vital signs are  significant for tachycardia with heart rate 129, and hypertension with blood pressure 146/103. Oxygen saturation is 100% which is normal. Head is normocephalic and atraumatic. PERRLA, EOMI. TMs are clear. Oropharynx shows tonsillar erythema and exudate and hypertrophy. She is not having any difficulty with her secretions and has normal phonation. Neck is nontender and supple without adenopathy. Back is nontender. Lungs are clear without rales, wheezes, rhonchi. Heart has regular rate rhythm without murmur. Abdomen is soft, flat, nontender without masses or hepatosplenomegaly. Extremities have full range of motion, no cyanosis or edema. Skin is warm and dry without rash. Neurologic: Mental status is normal, cranial nerves are intact, there are no motor or sensory deficits.   ED Course  Procedures (including critical care time)  Results for orders placed during the hospital encounter of 05/25/12  RAPID STREP SCREEN      Component Value Range   Streptococcus, Group A Screen (Direct) NEGATIVE  NEGATIVE     1. Viral pharyngitis       MDM  Oral pharyngitis. She will be given an dose of dexamethasone and senna with prescriptions for Percocet for pain. She is advised to return if symptoms worsen. Vital signs will be rechecked prior to discharge.        Dione Booze, MD 05/25/12 [phone]

## 2012-06-08 IMAGING — US US OB COMP +14 WK
2 series · 12 of 28 positions shown · non-contrast
Comparison: none

[Series 1: us ob comp +14 wk · 1 of 4 slices shown (1 of 2)]
[im 2/4]
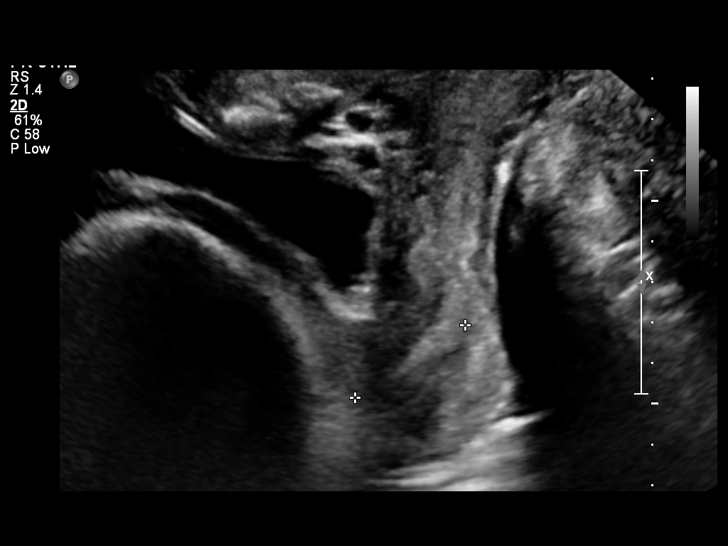

[Series 1: us ob comp +14 wk · 11 of 39 slices shown (2 of 2)]
[im 1/39]
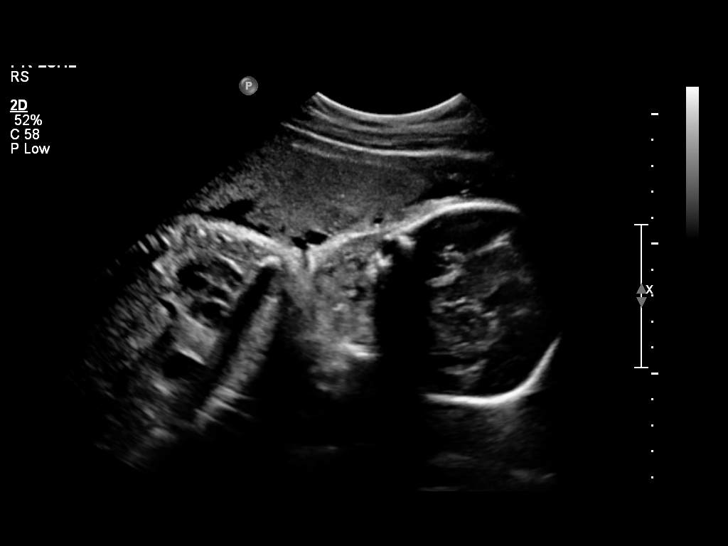
[im 4/39]
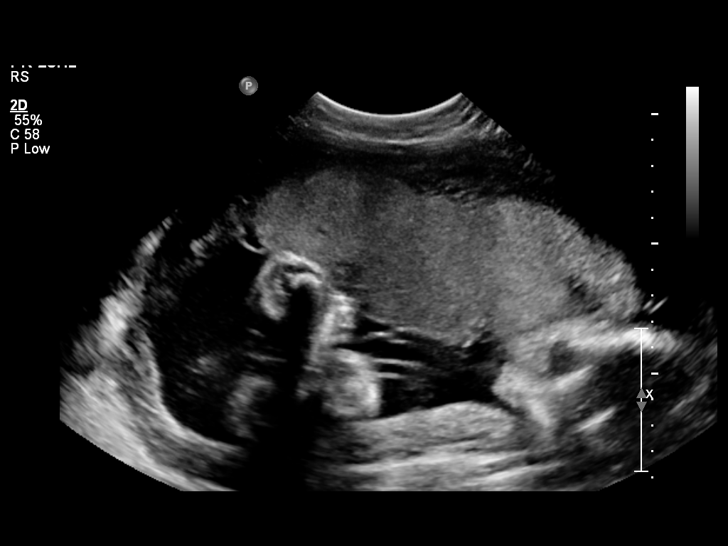
[im 8/39]
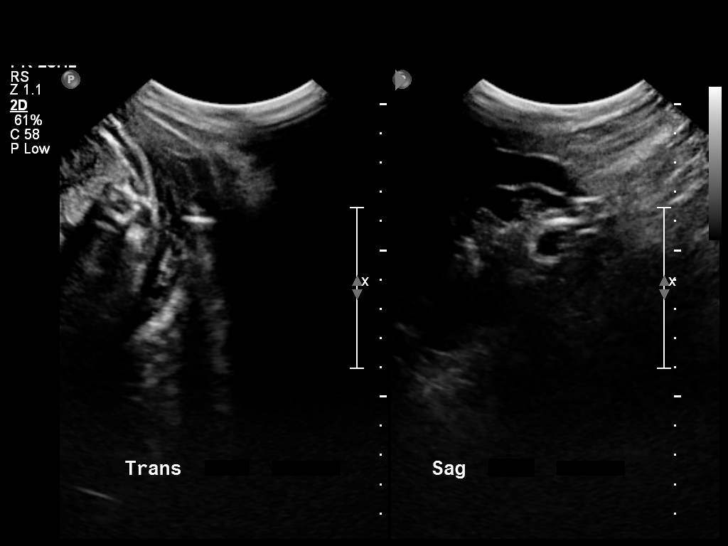
[im 12/39]
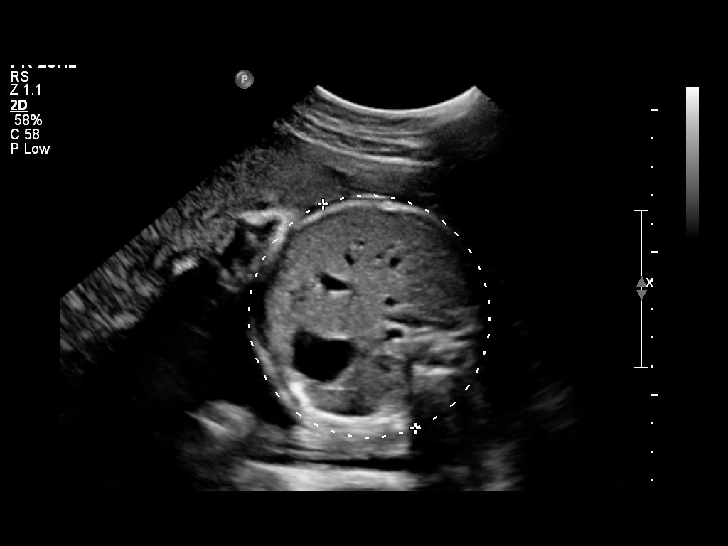
[im 15/39]
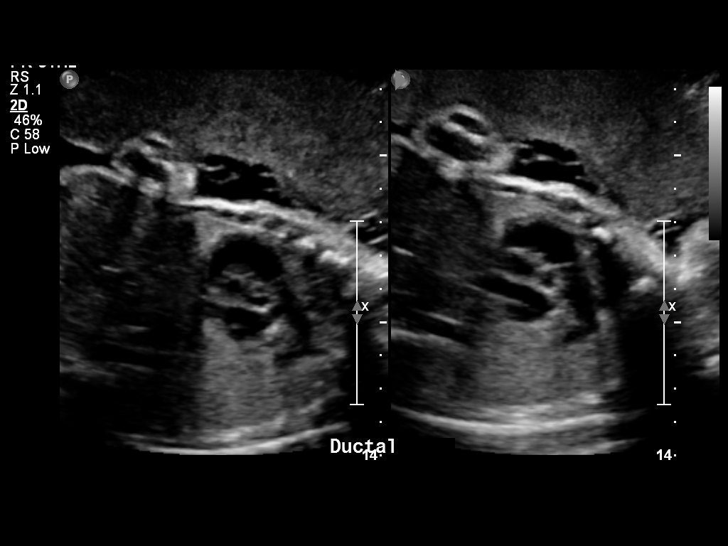
[im 20/39]
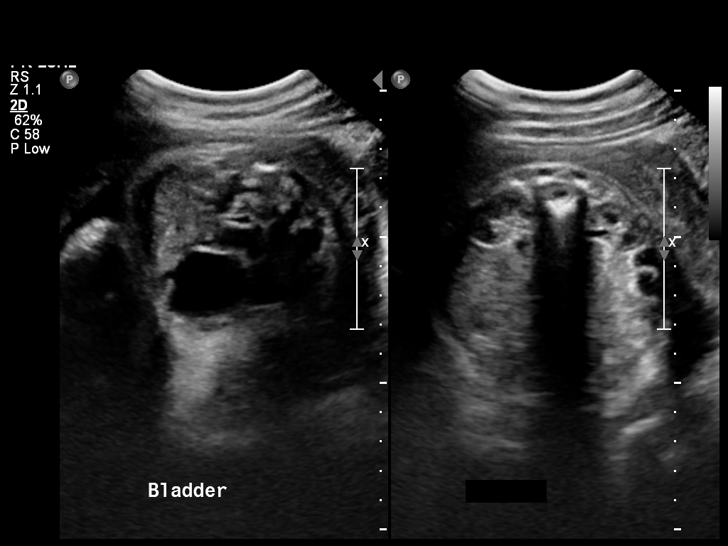
[im 23/39]
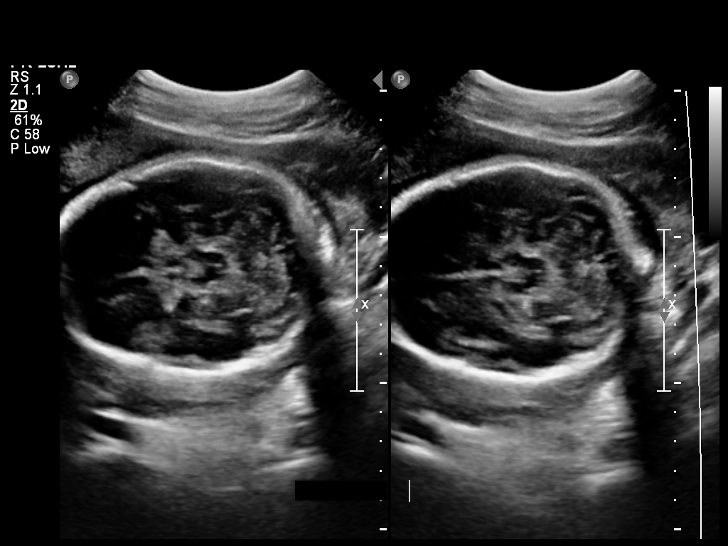
[im 26/39]
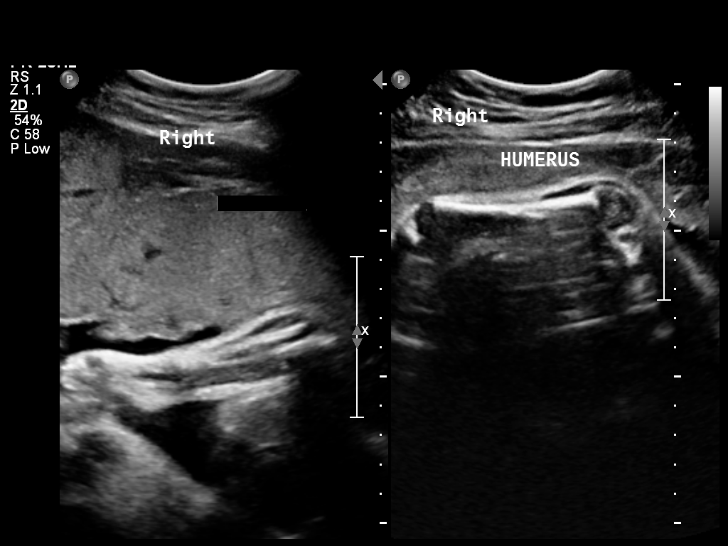
[im 31/39]
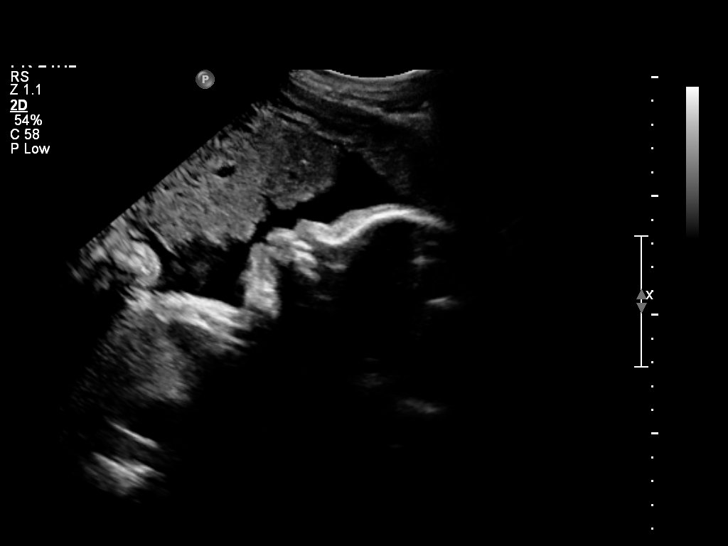
[im 34/39]
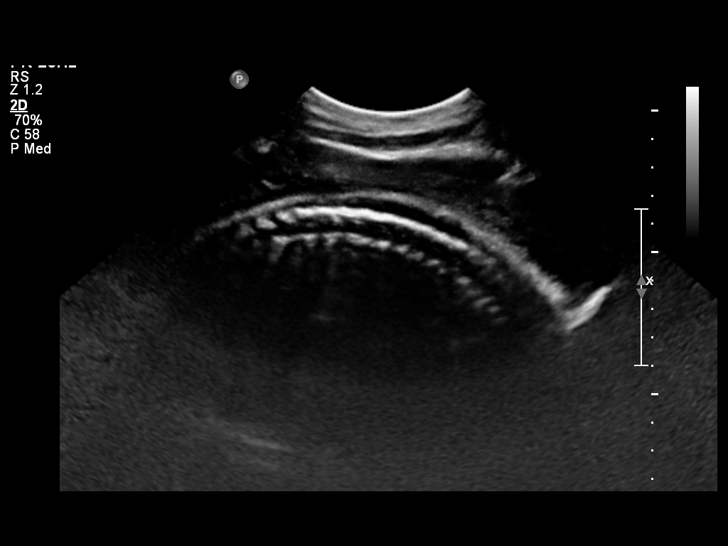
[im 37/39]
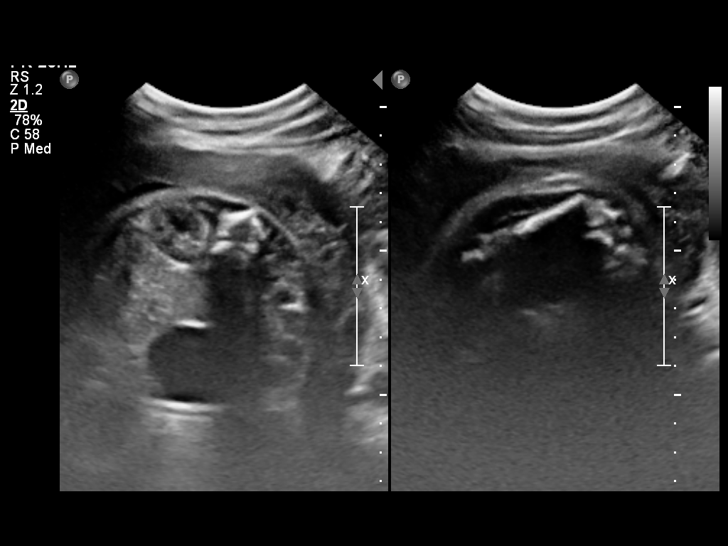

[12 of 28 positions shown; findings below may reference images not displayed]

OBSTETRICS REPORT
                      (Signed Final 02/09/2011 [DATE])

 Order#:         [id number]
Procedures

 US OB COMP + 14 WK                                    76805.1
Indications

 Hypertension - Gestational
 Assess Fetal Growth / Estimated Fetal Weight
Fetal Evaluation

 Fetal Heart Rate:  143                          bpm
 Cardiac Activity:  Observed
 Presentation:      Cephalic
 Placenta:          Anterior, above cervical os
 P. Cord            Not well visualized
 Insertion:

 Amniotic Fluid
 AFI FV:      Subjectively within normal limits
 AFI Sum:     15.64   cm       56  %Tile     Larg Pckt:     5.1  cm
 RUQ:   5.1     cm   RLQ:    4.97   cm    LUQ:   2.67    cm   LLQ:    2.9    cm
Biometry

 BPD:     74.2  mm     G. Age:  29w 5d                CI:        68.81   70 - 86
                                                      FL/HC:      22.4   19.3 -

 HC:     285.8  mm     G. Age:  31w 3d       28  %    HC/AC:      1.05   0.96 -

 AC:     271.8  mm     G. Age:  31w 2d       58  %    FL/BPD:     86.4   71 - 87
 FL:      64.1  mm     G. Age:  33w 1d       90  %    FL/AC:      23.6   20 - 24

 Est. FW:    2327  gm           4 lb     72  %
Gestational Age

 Clinical EDD:  30w 6d                                        EDD:   04/14/11
 U/S Today:     31w 3d                                        EDD:   04/10/11
 Best:          30w 6d     Det. By:  Clinical EDD             EDD:   04/14/11
Anatomy
 Cranium:           Appears normal      Aortic Arch:       Appears normal
 Fetal Cavum:       Appears normal      Ductal Arch:       Appears normal
 Ventricles:        Appears normal      Diaphragm:         Appears normal
 Choroid Plexus:    Appears normal      Stomach:           Appears
                                                           normal, left
                                                           sided
 Cerebellum:        Appears normal      Abdomen:           Appears normal
 Posterior Fossa:   Appears normal      Abdominal Wall:    Not well
                                                           visualized
 Nuchal Fold:       Not applicable      Cord Vessels:      Appears normal
                    (>20 wks GA)                           (3 vessel cord)
 Face:              Lips appear         Kidneys:           Appear normal
                    normal
 Heart:             Appears normal      Bladder:           Appears normal
                    (4 chamber &
                    axis)
 RVOT:              Appears normal      Spine:             Appears normal
 LVOT:              Appears normal      Limbs:             Appears normal
                                                           (hands, ankles,
                                                           feet)

 Other:     Female gender.
Cervix Uterus Adnexa

 Cervical Length:    3.2      cm

 Cervix:       Closed. Measured translabially.

 Adnexa:     No abnormality visualized.
Impression

 Single intrauterine gestation demonstrating an estimated
 gestational age by ultrasound of 31w 3d. This correlates well
 with expected estimated gestational age by clinical EDD of
 30w 6d. EFW is currently at the 72%.

 Visualized fetal anatomy appears normal. No late developing
 fetal anatomic abnormalities are noted associated with the
 lateral ventricles, four chamber heart, stomach, kidneys or
 bladder.

 Subjectively and quantitatively normal amniotic fluid volume.

 Normal cervical length and appearance, measured
 translabially.

## 2012-12-23 ENCOUNTER — Encounter (HOSPITAL_COMMUNITY): Payer: Self-pay | Admitting: *Deleted

## 2012-12-23 ENCOUNTER — Emergency Department (INDEPENDENT_AMBULATORY_CARE_PROVIDER_SITE_OTHER)
Admission: EM | Admit: 2012-12-23 | Discharge: 2012-12-23 | Disposition: A | Discharge status: Home / Self Care | Payer: BC Managed Care – PPO | Source: Home / Self Care | Attending: Family Medicine | Admitting: Family Medicine

## 2012-12-23 DIAGNOSIS — K5289 Other specified noninfective gastroenteritis and colitis: Secondary | ICD-10-CM

## 2012-12-23 DIAGNOSIS — K529 Noninfective gastroenteritis and colitis, unspecified: Secondary | ICD-10-CM

## 2012-12-23 MED ORDER — ONDANSETRON 4 MG PO TBDP
8.0000 mg | ORAL_TABLET | Freq: Once | ORAL | Status: AC
  Administered 2012-12-23: 8 mg via ORAL

## 2012-12-23 MED ORDER — ONDANSETRON 4 MG PO TBDP
ORAL_TABLET | ORAL | Status: AC
  Filled 2012-12-23: qty 2

## 2012-12-23 MED ORDER — ONDANSETRON HCL 4 MG PO TABS: 4.0000 mg | ORAL_TABLET | Freq: Four times a day (QID) | ORAL | Status: DC

## 2012-12-23 NOTE — ED Provider Notes (Signed)
History     CSN: [id number]  Arrival date & time 12/23/12  1620   First MD Initiated Contact with Patient 12/23/12 1622      Chief Complaint  Patient presents with  . Nausea    (Consider location/radiation/quality/duration/timing/severity/associated sxs/prior treatment) Patient is a 27 y.o. female presenting with vomiting. The history is provided by the patient.  Emesis  This is a new problem. The current episode started more than 2 days ago. The problem occurs 2 to 4 times per day. The problem has been gradually improving. The emesis has an appearance of stomach contents. The maximum temperature recorded prior to her arrival was 100 to 100.9 F. Associated symptoms include diarrhea and myalgias. Pertinent negatives include no chills, no cough and no fever.    Past Medical History  Diagnosis Date  . Hypertension during pregnancy    Past Surgical History  Procedure Date  . Cesarean section     No family history on file.  History  Substance Use Topics  . Smoking status: Never Smoker   . Smokeless tobacco: Not on file  . Alcohol Use: No    OB History    Grav Para Term Preterm Abortions TAB SAB Ect Mult Living   1               Review of Systems  Constitutional: Positive for appetite change. Negative for fever and chills.  HENT: Negative.   Respiratory: Negative for cough.   Gastrointestinal: Positive for nausea, vomiting and diarrhea. Negative for constipation.  Musculoskeletal: Positive for myalgias.    Allergies  Review of patient's allergies indicates no known allergies.  Home Medications   Current Outpatient Rx  Name  Route  Sig  Dispense  Refill  . IBUPROFEN 200 MG PO TABS   Oral   Take 400 mg by mouth every 6 (six) hours as needed. For pain         . ONDANSETRON HCL 4 MG PO TABS   Oral   Take 1 tablet (4 mg total) by mouth every 6 (six) hours.   6 tablet   0     BP 138/89  Pulse 131  Temp 100.5 F (38.1 C) (Oral)  Resp 16  SpO2 100%   LMP 12/23/2012  Physical Exam  Nursing note and vitals reviewed. Constitutional: She is oriented to person, place, and time. She appears well-developed and well-nourished.  HENT:  Mouth/Throat: Oropharynx is clear and moist.  Eyes: Conjunctivae normal are normal. Pupils are equal, round, and reactive to light.  Neck: Normal range of motion. Neck supple.  Cardiovascular: Normal rate, normal heart sounds and intact distal pulses.   Pulmonary/Chest: Effort normal and breath sounds normal.  Abdominal: Soft. Bowel sounds are normal. She exhibits no distension and no mass. There is no tenderness. There is no rebound and no guarding.  Lymphadenopathy:    She has no cervical adenopathy.  Neurological: She is alert and oriented to person, place, and time.  Skin: Skin is warm and dry.    ED Course  Procedures (including critical care time)  Labs Reviewed - No data to display No results found.   1. Gastroenteritis       MDM          Linna Hoff, MD 12/24/12 [phone]

## 2012-12-23 NOTE — ED Notes (Signed)
Pt  Reports   Symptoms    Of  Nausea  Vomiting  Diarrhea         X  3  Days           Pt      Pt  denys  Any urinary  Symptoms             Or  Discharge   Symptoms  Started  Today

## 2014-04-17 ENCOUNTER — Emergency Department (HOSPITAL_COMMUNITY)
Admission: EM | Admit: 2014-04-17 | Discharge: 2014-04-17 | Disposition: A | Discharge status: Home / Self Care | Payer: BC Managed Care – PPO | Attending: Emergency Medicine | Admitting: Emergency Medicine

## 2014-04-17 ENCOUNTER — Encounter (HOSPITAL_COMMUNITY): Payer: Self-pay | Admitting: Emergency Medicine

## 2014-04-17 DIAGNOSIS — H9209 Otalgia, unspecified ear: Secondary | ICD-10-CM | POA: Insufficient documentation

## 2014-04-17 DIAGNOSIS — J3489 Other specified disorders of nose and nasal sinuses: Secondary | ICD-10-CM | POA: Insufficient documentation

## 2014-04-17 DIAGNOSIS — R0981 Nasal congestion: Secondary | ICD-10-CM

## 2014-04-17 DIAGNOSIS — I10 Essential (primary) hypertension: Secondary | ICD-10-CM | POA: Insufficient documentation

## 2014-04-17 MED ORDER — FLUTICASONE PROPIONATE 50 MCG/ACT NA SUSP: 1.0000 | Freq: Every day | NASAL | Status: DC

## 2014-04-17 MED ORDER — CETIRIZINE HCL 10 MG PO CAPS: 10.0000 mg | ORAL_CAPSULE | Freq: Every day | ORAL | Status: DC

## 2014-04-17 NOTE — ED Provider Notes (Signed)
CSN: [id number]     Arrival date & time 04/17/14  0831 History   None   This chart was scribed for non-physician practitioner, Noland Fordyce, working with Shaune Pollack, MD by Terressa Koyanagi, ED Scribe. This patient was seen in room TR10C/TR10C and the patient's care was started at 9:05 AM.  Chief Complaint  Patient presents with  . Nasal Congestion   The history is provided by the patient. No language interpreter was used.   HPI Comments: Danielle Short is a 28 y.o. female, with a history of HTN, who presents to the Emergency Department complaining of intermittent congestion onset a few months ago, worsened over the last 2-3 days. Pt also complains of associated intermittent chills and ear pain but denies fevers and sore throat. Pt reports taking AlkaSeltzer at home without much relief and NyQuil last night with some relief. Pt denies any allergies to meds.   Past Medical History  Diagnosis Date  . Hypertension during pregnancy   Past Surgical History  Procedure Laterality Date  . Cesarean section     No family history on file. History  Substance Use Topics  . Smoking status: Never Smoker   . Smokeless tobacco: Not on file  . Alcohol Use: No   OB History   Grav Para Term Preterm Abortions TAB SAB Ect Mult Living   1              Review of Systems  Constitutional: Positive for chills. Negative for fever.  HENT: Positive for congestion and ear pain. Negative for sore throat.   All other systems reviewed and are negative.     Allergies  Review of patient's allergies indicates no known allergies.  Home Medications   Prior to Admission medications   Medication Sig Start Date End Date Taking? Authorizing Provider  aspirin-sod bicarb-citric acid (ALKA-SELTZER) 325 MG TBEF tablet Take 325 mg by mouth every 6 (six) hours as needed (cold).   Yes Historical Provider, MD  ibuprofen (ADVIL,MOTRIN) 200 MG tablet Take 400 mg by mouth every 6 (six) hours as needed. For pain   Yes  Historical Provider, MD  Pseudoeph-Doxylamine-DM-APAP (NYQUIL PO) Take 2 capsules by mouth every 12 (twelve) hours as needed (cold).   Yes Historical Provider, MD   Triage Vitals: BP 133/80  Pulse 100  Temp(Src) 98.7 F (37.1 C) (Oral)  Resp 16  SpO2 98%  LMP 04/13/2014  Physical Exam  Nursing note and vitals reviewed. Constitutional: She is oriented to person, place, and time. She appears well-developed and well-nourished.  HENT:  Head: Normocephalic and atraumatic.  Right Ear: Hearing, tympanic membrane, external ear and ear canal normal.  Left Ear: Hearing, tympanic membrane, external ear and ear canal normal.  Nose: Mucosal edema present.  Eyes: EOM are normal.  Neck: Normal range of motion.  Cardiovascular: Normal rate, regular rhythm and normal heart sounds.   Pulmonary/Chest: Effort normal and breath sounds normal. No respiratory distress. She has no wheezes. She exhibits no tenderness.  Musculoskeletal: Normal range of motion.  Neurological: She is alert and oriented to person, place, and time.  Skin: Skin is warm and dry.  Psychiatric: She has a normal mood and affect. Her behavior is normal.    ED Course  Procedures (including critical care time) DIAGNOSTIC STUDIES: Oxygen Saturation is 98% on RA, normal by my interpretation.    COORDINATION OF CARE:  9:07 AM-Discussed treatment plan which includes meds including nasal spray and zyrtec with pt at bedside and pt agreed  to plan.   Labs Review Labs Reviewed - No data to display  Imaging Review No results found.   EKG Interpretation None      MDM   Final diagnoses:  None    Pt presenting with signs and symptoms consistent with allergic rhinitis. Will tx symptomatically. Return precautions provided. Pt verbalized understanding and agreement with tx plan.   I personally performed the services described in this documentation, which was scribed in my presence. The recorded information has been reviewed and  is accurate.    Noland Fordyce, PA-C 04/17/14 [phone]

## 2014-04-17 NOTE — ED Notes (Signed)
Pt reports congestion off and on for months, states she thinks it is allergies, but medication is not working. No fever.

## 2014-04-20 NOTE — ED Provider Notes (Signed)
History/physical exam/procedure(s) were performed by non-physician practitioner and as supervising physician I was immediately available for consultation/collaboration. I have reviewed all notes and am in agreement with care and plan.   Shaune Pollack, MD 04/20/14 [phone]

## 2014-09-21 ENCOUNTER — Encounter (HOSPITAL_COMMUNITY): Payer: Self-pay | Admitting: Emergency Medicine

## 2015-02-02 ENCOUNTER — Encounter (HOSPITAL_COMMUNITY): Payer: Self-pay | Admitting: Emergency Medicine

## 2015-02-02 ENCOUNTER — Emergency Department (INDEPENDENT_AMBULATORY_CARE_PROVIDER_SITE_OTHER)
Admission: EM | Admit: 2015-02-02 | Discharge: 2015-02-02 | Disposition: A | Discharge status: Home / Self Care | Payer: BLUE CROSS/BLUE SHIELD | Source: Home / Self Care | Attending: Family Medicine | Admitting: Family Medicine

## 2015-02-02 DIAGNOSIS — J029 Acute pharyngitis, unspecified: Secondary | ICD-10-CM

## 2015-02-02 LAB — POCT RAPID STREP A: STREPTOCOCCUS, GROUP A SCREEN (DIRECT): NEGATIVE

## 2015-02-02 NOTE — ED Provider Notes (Signed)
CSN: [id number]     Arrival date & time 02/02/15  0831 History   First MD Initiated Contact with Patient 02/02/15 [phone]     Chief Complaint  Patient presents with  . Sore Throat   (Consider location/radiation/quality/duration/timing/severity/associated sxs/prior Treatment) HPI Comments: C/O sore throat for 4 days.   Past Medical History  Diagnosis Date  . Hypertension during pregnancy   Past Surgical History  Procedure Laterality Date  . Cesarean section     History reviewed. No pertinent family history. History  Substance Use Topics  . Smoking status: Never Smoker   . Smokeless tobacco: Not on file  . Alcohol Use: No   OB History    Gravida Para Term Preterm AB TAB SAB Ectopic Multiple Living   1              Review of Systems  Constitutional: Negative for fever, chills and activity change.  HENT: Positive for sore throat. Negative for congestion, ear pain, postnasal drip, rhinorrhea, sinus pressure and trouble swallowing.   Respiratory: Negative for shortness of breath.        Occasional cough  Cardiovascular: Negative for chest pain and leg swelling.  Gastrointestinal: Negative.   Genitourinary: Negative.   Neurological: Negative.     Allergies  Review of patient's allergies indicates no known allergies.  Home Medications   Prior to Admission medications   Medication Sig Start Date End Date Taking? Authorizing Provider  aspirin-sod bicarb-citric acid (ALKA-SELTZER) 325 MG TBEF tablet Take 325 mg by mouth every 6 (six) hours as needed (cold).    Historical Provider, MD  Cetirizine HCl 10 MG CAPS Take 1 capsule (10 mg total) by mouth daily. 04/17/14   Noland Fordyce, PA-C  fluticasone (FLONASE) 50 MCG/ACT nasal spray Place 1 spray into both nostrils daily. 04/17/14   Noland Fordyce, PA-C  ibuprofen (ADVIL,MOTRIN) 200 MG tablet Take 400 mg by mouth every 6 (six) hours as needed. For pain    Historical Provider, MD  Pseudoeph-Doxylamine-DM-APAP (NYQUIL PO) Take 2  capsules by mouth every 12 (twelve) hours as needed (cold).    Historical Provider, MD   BP 138/91 mmHg  Pulse 89  Temp(Src) 99.2 F (37.3 C) (Oral)  Resp 14  SpO2 100%  LMP 01/26/2015 Physical Exam  Constitutional: She is oriented to person, place, and time. She appears well-developed and well-nourished. No distress.  HENT:  Mouth/Throat: No oropharyngeal exudate.  Bilateral TMs are normal Oropharynx difficult to see due to patient's retraction of tongue. Quick glance reveals minor streaky erythema but primarily pink,  and clear PND.  Eyes: Conjunctivae and EOM are normal.  Neck: Normal range of motion. Neck supple. No thyromegaly present.  Cardiovascular: Normal rate, regular rhythm and normal heart sounds.   Pulmonary/Chest: Effort normal. No respiratory distress. She has no wheezes. She has no rales.  Musculoskeletal: She exhibits no edema.  Lymphadenopathy:    She has no cervical adenopathy.  Neurological: She is alert and oriented to person, place, and time. She exhibits normal muscle tone.  Skin: Skin is warm and dry.  Psychiatric: She has a normal mood and affect.  Nursing note and vitals reviewed.   ED Course  Procedures (including critical care time) Labs Review Labs Reviewed  POCT RAPID STREP A ([place])   Results for orders placed or performed during the hospital encounter of 02/02/15  POCT rapid strep A [facility])  Result Value Ref Range   Streptococcus, Group A Screen (Direct) NEGATIVE NEGATIVE  Imaging Review No results found.   MDM   1. Pharyngitis    Cepacol Lozenges Ibuprofen for pain Drink lots of fluids May continue Benadryl if that helps or nondrowsy medicine such as Allegra or Zyrtec    Janne Napoleon, NP 02/02/15 0315  Janne Napoleon, NP 02/02/15 [phone]

## 2015-02-02 NOTE — Discharge Instructions (Signed)
Pharyngitis Cepacol Lozenges Ibuprofen for pain Drink lots of fluids May continue Benadryl if that helps or nondrowsy medicine such as Allegra or Zyrtec. Pharyngitis is a sore throat (pharynx). There is redness, pain, and swelling of your throat. HOME CARE   Drink enough fluids to keep your pee (urine) clear or pale yellow.  Only take medicine as told by your doctor.  You may get sick again if you do not take medicine as told. Finish your medicines, even if you start to feel better.  Do not take aspirin.  Rest.  Rinse your mouth (gargle) with salt water ( tsp of salt per 1 qt of water) every 1-2 hours. This will help the pain.  If you are not at risk for choking, you can suck on hard candy or sore throat lozenges. GET HELP IF:  You have large, tender lumps on your neck.  You have a rash.  You cough up green, yellow-brown, or bloody spit. GET HELP RIGHT AWAY IF:   You have a stiff neck.  You drool or cannot swallow liquids.  You throw up (vomit) or are not able to keep medicine or liquids down.  You have very bad pain that does not go away with medicine.  You have problems breathing (not from a stuffy nose). MAKE SURE YOU:   Understand these instructions.  Will watch your condition.  Will get help right away if you are not doing well or get worse. Document Released: 04/24/2008 Document Revised: 08/27/2013 Document Reviewed: 07/14/2013 [facility] Patient Information 2015 [place], [place]. This information is not intended to replace advice given to you by your health care provider. Make sure you discuss any questions you have with your health care provider.  Sore Throat A sore throat is pain, burning, irritation, or scratchiness of the throat. There is often pain or tenderness when swallowing or talking. A sore throat may be accompanied by other symptoms, such as coughing, sneezing, fever, and swollen neck glands. A sore throat is often the first sign of another sickness,  such as a cold, flu, strep throat, or mononucleosis (commonly known as mono). Most sore throats go away without medical treatment. CAUSES  The most common causes of a sore throat include:  A viral infection, such as a cold, flu, or mono.  A bacterial infection, such as strep throat, tonsillitis, or whooping cough.  Seasonal allergies.  Dryness in the air.  Irritants, such as smoke or pollution.  Gastroesophageal reflux disease (GERD). HOME CARE INSTRUCTIONS   Only take over-the-counter medicines as directed by your caregiver.  Drink enough fluids to keep your urine clear or pale yellow.  Rest as needed.  Try using throat sprays, lozenges, or sucking on hard candy to ease any pain (if older than 4 years or as directed).  Sip warm liquids, such as broth, herbal tea, or warm water with honey to relieve pain temporarily. You may also eat or drink cold or frozen liquids such as frozen ice pops.  Gargle with salt water (mix 1 tsp salt with 8 oz of water).  Do not smoke and avoid secondhand smoke.  Put a cool-mist humidifier in your bedroom at night to moisten the air. You can also turn on a hot shower and sit in the bathroom with the door closed for 5-10 minutes. SEEK IMMEDIATE MEDICAL CARE IF:  You have difficulty breathing.  You are unable to swallow fluids, soft foods, or your saliva.  You have increased swelling in the throat.  Your sore throat  does not get better in 7 days.  You have nausea and vomiting.  You have a fever or persistent symptoms for more than 2-3 days.  You have a fever and your symptoms suddenly get worse. MAKE SURE YOU:   Understand these instructions.  Will watch your condition.  Will get help right away if you are not doing well or get worse. Document Released: 12/14/2004 Document Revised: 10/23/2012 Document Reviewed: 07/14/2012 [facility] Patient Information 2015 [place], [place]. This information is not intended to replace advice given to you  by your health care provider. Make sure you discuss any questions you have with your health care provider.

## 2015-02-02 NOTE — ED Notes (Signed)
C/o  Sore throat x 4 days.  Denies fever, n/v/d.   No relief with using otc meds.

## 2015-02-04 LAB — CULTURE, GROUP A STREP: Strep A Culture: NEGATIVE

## 2016-06-12 ENCOUNTER — Emergency Department (HOSPITAL_COMMUNITY)
Admission: EM | Admit: 2016-06-12 | Discharge: 2016-06-12 | Disposition: A | Discharge status: Home / Self Care | Payer: BLUE CROSS/BLUE SHIELD | Attending: Emergency Medicine | Admitting: Emergency Medicine

## 2016-06-12 ENCOUNTER — Encounter (HOSPITAL_COMMUNITY): Payer: Self-pay | Admitting: Emergency Medicine

## 2016-06-12 DIAGNOSIS — Z791 Long term (current) use of non-steroidal anti-inflammatories (NSAID): Secondary | ICD-10-CM | POA: Diagnosis not present

## 2016-06-12 DIAGNOSIS — R319 Hematuria, unspecified: Secondary | ICD-10-CM

## 2016-06-12 DIAGNOSIS — N390 Urinary tract infection, site not specified: Secondary | ICD-10-CM | POA: Diagnosis not present

## 2016-06-12 DIAGNOSIS — I10 Essential (primary) hypertension: Secondary | ICD-10-CM | POA: Diagnosis not present

## 2016-06-12 LAB — BASIC METABOLIC PANEL
ANION GAP: 7 (ref 5–15)
BUN: 16 mg/dL (ref 6–20)
CALCIUM: 9.5 mg/dL (ref 8.9–10.3)
CO2: 26 mmol/L (ref 22–32)
Chloride: 103 mmol/L (ref 101–111)
Creatinine, Ser: 0.8 mg/dL (ref 0.44–1.00)
GLUCOSE: 97 mg/dL (ref 65–99)
POTASSIUM: 3.6 mmol/L (ref 3.5–5.1)
SODIUM: 136 mmol/L (ref 135–145)

## 2016-06-12 LAB — URINALYSIS, ROUTINE W REFLEX MICROSCOPIC
Bilirubin Urine: NEGATIVE
GLUCOSE, UA: NEGATIVE mg/dL
Ketones, ur: NEGATIVE mg/dL
Nitrite: NEGATIVE
Protein, ur: 100 mg/dL — AB
Specific Gravity, Urine: 1.02 (ref 1.005–1.030)
pH: 7 (ref 5.0–8.0)

## 2016-06-12 LAB — CBC
HEMATOCRIT: 37.4 % (ref 36.0–46.0)
HEMOGLOBIN: 11 g/dL — AB (ref 12.0–15.0)
MCH: 23 pg — ABNORMAL LOW (ref 26.0–34.0)
MCHC: 29.4 g/dL — AB (ref 30.0–36.0)
MCV: 78.1 fL (ref 78.0–100.0)
Platelets: 266 10*3/uL (ref 150–400)
RBC: 4.79 MIL/uL (ref 3.87–5.11)
RDW: 17.6 % — AB (ref 11.5–15.5)
WBC: 8.7 10*3/uL (ref 4.0–10.5)

## 2016-06-12 LAB — URINE MICROSCOPIC-ADD ON

## 2016-06-12 LAB — I-STAT BETA HCG BLOOD, ED (MC, WL, AP ONLY)

## 2016-06-12 MED ORDER — CEPHALEXIN 500 MG PO CAPS: 500.0000 mg | ORAL_CAPSULE | Freq: Three times a day (TID) | ORAL | 0 refills | Status: DC

## 2016-06-12 NOTE — ED Provider Notes (Signed)
[place] DEPT Provider Note   CSN: [id number] Arrival date & time: 06/12/16  1015  First Provider Contact:  First MD Initiated Contact with Patient 06/12/16 1202     History   Chief Complaint Chief Complaint  Patient presents with  . Urinary Frequency  . Urinary Urgency  . Abdominal Pain   HPI  Danielle Short is an 30 y.o. female with no significant PMH who presents to the ED for evaluation of urinary frequency, increased urinary urgency, and lower abdominal pain for the past three days. She states it doesn't burn when she urinates but she does have a lower abdominal burning pain at the end of her stream. She has noted some urine cloudiness. Denies hematuria. Denies nausea or vomiting. Denies back pain. Denies fever or chills.   Past Medical History:  Diagnosis Date  . Hypertension during pregnancy    There are no active problems to display for this patient.   Past Surgical History:  Procedure Laterality Date  . CESAREAN SECTION      OB History    Gravida Para Term Preterm AB Living   1             SAB TAB Ectopic Multiple Live Births                   Home Medications    Prior to Admission medications   Medication Sig Start Date End Date Taking? Authorizing Provider  ibuprofen (ADVIL,MOTRIN) 200 MG tablet Take 400 mg by mouth every 6 (six) hours as needed. For pain   Yes Historical Provider, MD  Multiple Vitamins-Minerals (MULTIVITAMIN WITH MINERALS) tablet Take 1 tablet by mouth daily.   Yes Historical Provider, MD  Cetirizine HCl 10 MG CAPS Take 1 capsule (10 mg total) by mouth daily. Patient not taking: Reported on 06/12/2016 04/17/14   Noland Fordyce, PA-C  fluticasone [facility]) 50 MCG/ACT nasal spray Place 1 spray into both nostrils daily. Patient not taking: Reported on 06/12/2016 04/17/14   Noland Fordyce, PA-C    Family History No family history on file.  Social History Social History  Substance Use Topics  . Smoking status: Never Smoker  .  Smokeless tobacco: Never Used  . Alcohol use No     Allergies   Review of patient's allergies indicates no known allergies.   Review of Systems Review of Systems  All other systems reviewed and are negative.    Physical Exam Updated Vital Signs BP (!) 133/119 (BP Location: Right Arm)   Pulse 94   Temp 98 F (36.7 C) (Oral)   Resp 17   LMP 05/22/2016   SpO2 100%   Physical Exam  Constitutional: She is oriented to person, place, and time.  HENT:  Right Ear: External ear normal.  Left Ear: External ear normal.  Nose: Nose normal.  Mouth/Throat: Oropharynx is clear and moist. No oropharyngeal exudate.  Eyes: Conjunctivae and EOM are normal. Pupils are equal, round, and reactive to light.  Neck: Normal range of motion. Neck supple.  Cardiovascular: Normal rate, regular rhythm, normal heart sounds and intact distal pulses.   Pulmonary/Chest: Effort normal and breath sounds normal. No respiratory distress. She has no wheezes. She exhibits no tenderness.  Abdominal: Soft. Bowel sounds are normal. She exhibits no distension. There is no tenderness. There is no rebound and no guarding.  No CVA tenderness  Musculoskeletal: She exhibits no edema.  Neurological: She is alert and oriented to person, place, and time. No cranial nerve deficit.  Skin: Skin is warm and dry.  Psychiatric: She has a normal mood and affect.  Nursing note and vitals reviewed.    ED Treatments / Results  Labs (all labs ordered are listed, but only abnormal results are displayed) Labs Reviewed  URINALYSIS, ROUTINE W REFLEX MICROSCOPIC (NOT AT [facility]) - Abnormal; Notable for the following:       Result Value   APPearance CLOUDY (*)    Hgb urine dipstick MODERATE (*)    Protein, ur 100 (*)    Leukocytes, UA MODERATE (*)    All other components within normal limits  CBC - Abnormal; Notable for the following:    Hemoglobin 11.0 (*)    MCH 23.0 (*)    MCHC 29.4 (*)    RDW 17.6 (*)    All other  components within normal limits  URINE MICROSCOPIC-ADD ON - Abnormal; Notable for the following:    Squamous Epithelial / LPF 0-5 (*)    Bacteria, UA MANY (*)    All other components within normal limits  URINE CULTURE  BASIC METABOLIC PANEL  I-STAT BETA HCG BLOOD, ED ([facility], [facility], [facility] ONLY)    EKG  EKG Interpretation None       Radiology No results found.  Procedures Procedures (including critical care time)  Medications Ordered in ED Medications - No data to display   Initial Impression / Assessment and Plan / ED Course  I have reviewed the triage vital signs and the nursing notes.  Pertinent labs & imaging results that were available during my care of the patient were reviewed by me and considered in my medical decision making (see chart for details)  Bloodwork unremarkable. UA with evidence of infection. With pt's symptoms, we will treat as UTI. Rx given for keflex. Will send for culture. ER return precautions given.   Final Clinical Impressions(s) / ED Diagnoses   Final diagnoses:  Urinary tract infection with hematuria, site unspecified    New Prescriptions Discharge Medication List as of 06/12/2016  2:38 PM    START taking these medications   Details  cephALEXin (KEFLEX) 500 MG capsule Take 1 capsule (500 mg total) by mouth 3 (three) times daily., Starting Mon 06/12/2016, Print         Anne Ng, PA-C 06/12/16 1501    Orlie Dakin, MD 06/12/16 [phone]

## 2016-06-12 NOTE — ED Triage Notes (Signed)
Patient states since Friday been having urinary frequency and urgency, then will have lower abd/pelvic pain.  Patient states urine is yellow and denies any foul odors, but when she wipes she notices "it's cloudy".

## 2016-06-12 NOTE — Discharge Instructions (Signed)
You have a urinary tract infection. Take the entire course of antibiotics as prescribed. Return to the emergency room for new or worsening symptoms.

## 2016-06-14 ENCOUNTER — Encounter (HOSPITAL_COMMUNITY): Payer: Self-pay | Admitting: Emergency Medicine

## 2016-06-14 ENCOUNTER — Emergency Department (HOSPITAL_COMMUNITY)
Admission: EM | Admit: 2016-06-14 | Discharge: 2016-06-15 | Disposition: A | Discharge status: Home / Self Care | Payer: BLUE CROSS/BLUE SHIELD | Attending: Emergency Medicine | Admitting: Emergency Medicine

## 2016-06-14 DIAGNOSIS — R1030 Lower abdominal pain, unspecified: Secondary | ICD-10-CM | POA: Diagnosis present

## 2016-06-14 DIAGNOSIS — N390 Urinary tract infection, site not specified: Secondary | ICD-10-CM | POA: Diagnosis not present

## 2016-06-14 DIAGNOSIS — Z791 Long term (current) use of non-steroidal anti-inflammatories (NSAID): Secondary | ICD-10-CM | POA: Insufficient documentation

## 2016-06-14 DIAGNOSIS — I10 Essential (primary) hypertension: Secondary | ICD-10-CM | POA: Diagnosis not present

## 2016-06-14 DIAGNOSIS — Z792 Long term (current) use of antibiotics: Secondary | ICD-10-CM | POA: Diagnosis not present

## 2016-06-14 NOTE — ED Triage Notes (Signed)
Pt states she was seen here on Monday and dx with UTI. Has been taking meds as prescribed but is here tonight with continued and increasing gen abd pain.  Denies NVD.  Denies dysuria.

## 2016-06-15 LAB — GC/CHLAMYDIA PROBE AMP (~~LOC~~) NOT AT ARMC
Chlamydia: NEGATIVE
Neisseria Gonorrhea: NEGATIVE

## 2016-06-15 LAB — CBC WITH DIFFERENTIAL/PLATELET
Basophils Absolute: 0 10*3/uL (ref 0.0–0.1)
Basophils Relative: 0 %
Eosinophils Absolute: 0.1 10*3/uL (ref 0.0–0.7)
Eosinophils Relative: 1 %
HCT: 35.6 % — ABNORMAL LOW (ref 36.0–46.0)
Hemoglobin: 10.9 g/dL — ABNORMAL LOW (ref 12.0–15.0)
Lymphocytes Relative: 26 %
Lymphs Abs: 1.9 10*3/uL (ref 0.7–4.0)
MCH: 23.4 pg — ABNORMAL LOW (ref 26.0–34.0)
MCHC: 30.6 g/dL (ref 30.0–36.0)
MCV: 76.6 fL — ABNORMAL LOW (ref 78.0–100.0)
Monocytes Absolute: 0.6 10*3/uL (ref 0.1–1.0)
Monocytes Relative: 8 %
Neutro Abs: 4.9 10*3/uL (ref 1.7–7.7)
Neutrophils Relative %: 65 %
Platelets: 262 10*3/uL (ref 150–400)
RBC: 4.65 MIL/uL (ref 3.87–5.11)
RDW: 17.8 % — ABNORMAL HIGH (ref 11.5–15.5)
WBC: 7.5 10*3/uL (ref 4.0–10.5)

## 2016-06-15 LAB — LIPASE, BLOOD: Lipase: 17 U/L (ref 11–51)

## 2016-06-15 LAB — COMPREHENSIVE METABOLIC PANEL
ALBUMIN: 4.2 g/dL (ref 3.5–5.0)
ALT: 11 U/L — AB (ref 14–54)
ANION GAP: 5 (ref 5–15)
AST: 17 U/L (ref 15–41)
Alkaline Phosphatase: 46 U/L (ref 38–126)
BUN: 15 mg/dL (ref 6–20)
CHLORIDE: 106 mmol/L (ref 101–111)
CO2: 26 mmol/L (ref 22–32)
Calcium: 9.1 mg/dL (ref 8.9–10.3)
Creatinine, Ser: 0.61 mg/dL (ref 0.44–1.00)
GFR calc non Af Amer: >60 mL/min (ref >60)
GLUCOSE: 100 mg/dL — AB (ref 65–99)
Potassium: 4.1 mmol/L (ref 3.5–5.1)
SODIUM: 137 mmol/L (ref 135–145)
Total Bilirubin: 0.6 mg/dL (ref 0.3–1.2)
Total Protein: 8.4 g/dL — ABNORMAL HIGH (ref 6.5–8.1)

## 2016-06-15 LAB — URINALYSIS, ROUTINE W REFLEX MICROSCOPIC
Bilirubin Urine: NEGATIVE
Glucose, UA: NEGATIVE mg/dL
Ketones, ur: NEGATIVE mg/dL
NITRITE: NEGATIVE
PH: 6 (ref 5.0–8.0)
Protein, ur: NEGATIVE mg/dL
SPECIFIC GRAVITY, URINE: 1.021 (ref 1.005–1.030)

## 2016-06-15 LAB — URINE MICROSCOPIC-ADD ON

## 2016-06-15 LAB — WET PREP, GENITAL
SPERM: NONE SEEN
TRICH WET PREP: NONE SEEN
YEAST WET PREP: NONE SEEN

## 2016-06-15 LAB — URINE CULTURE

## 2016-06-15 LAB — PREGNANCY, URINE: PREG TEST UR: NEGATIVE

## 2016-06-15 MED ORDER — NAPROXEN 500 MG PO TABS: 500.0000 mg | ORAL_TABLET | Freq: Two times a day (BID) | ORAL | 0 refills | Status: DC

## 2016-06-15 NOTE — ED Provider Notes (Signed)
[place] DEPT Provider Note   CSN: [id number] Arrival date & time: 06/14/16  2312  First Provider Contact:  First MD Initiated Contact with Patient 06/15/16 0029    By signing my name below, I, Randa Evens, attest that this documentation has been prepared under the direction and in the presence of Antonietta Breach, PA-C Electronically Signed: Randa Evens, ED Scribe. 06/15/16. 1:08 AM.     History   Chief Complaint Chief Complaint  Patient presents with  . Abdominal Pain    HPI Danielle Short is a 30 y.o. female.  The history is provided by the patient. No language interpreter was used.   HPI Comments: Danielle Short is a 30 y.o. female who presents to the Emergency Department complaining of persistent abdominal pain onset 3 days prior. Pt states that she was seen here 3 days ago and diagnosed with an UTI. Pt states that she has been taking the prescribed Keflex with no relief. Pt reports that she is sexually active without protection but is not concerned for STI's. Denies nausea, vomiting, fever, diarrhea or vaginal discharge. Abdominal surgical hx significant for cesarean section. Her urine culture from 06/12/16 grew out Staph UTI; no culture sensitivity as of yet.   Past Medical History:  Diagnosis Date  . Hypertension during pregnancy    There are no active problems to display for this patient.   Past Surgical History:  Procedure Laterality Date  . CESAREAN SECTION      OB History    Gravida Para Term Preterm AB Living   1             SAB TAB Ectopic Multiple Live Births                   Home Medications    Prior to Admission medications   Medication Sig Start Date End Date Taking? Authorizing Provider  cephALEXin (KEFLEX) 500 MG capsule Take 1 capsule (500 mg total) by mouth 3 (three) times daily. 06/12/16  Yes Olivia Canter Sam, PA-C  ibuprofen (ADVIL,MOTRIN) 200 MG tablet Take 400 mg by mouth every 6 (six) hours as needed. For pain   Yes Historical  Provider, MD  Cetirizine HCl 10 MG CAPS Take 1 capsule (10 mg total) by mouth daily. Patient not taking: Reported on 06/12/2016 04/17/14   Noland Fordyce, PA-C  fluticasone [facility]) 50 MCG/ACT nasal spray Place 1 spray into both nostrils daily. Patient not taking: Reported on 06/12/2016 04/17/14   Noland Fordyce, PA-C  naproxen (NAPROSYN) 500 MG tablet Take 1 tablet (500 mg total) by mouth 2 (two) times daily. 06/15/16   Antonietta Breach, PA-C    Family History No family history on file.  Social History Social History  Substance Use Topics  . Smoking status: Never Smoker  . Smokeless tobacco: Never Used  . Alcohol use No     Allergies   Review of patient's allergies indicates no known allergies.   Review of Systems Review of Systems  Constitutional: Negative for fever.  Gastrointestinal: Positive for abdominal pain. Negative for diarrhea, nausea and vomiting.  Genitourinary: Negative for vaginal discharge.     Physical Exam Updated Vital Signs BP 131/94   Pulse 76   Temp 98.2 F (36.8 C) (Oral)   Resp 18   LMP 05/22/2016   SpO2 100%   Physical Exam  Constitutional: She is oriented to person, place, and time. She appears well-developed and well-nourished. No distress.  Nontoxic appearing and in no distress  HENT:  Head: Normocephalic  and atraumatic.  Eyes: Conjunctivae and EOM are normal. No scleral icterus.  Neck: Normal range of motion.  Cardiovascular: Normal rate, regular rhythm and normal heart sounds.   Pulmonary/Chest: Effort normal. No respiratory distress. She has no wheezes.  Respirations even and unlabored  Abdominal: Soft. She exhibits no mass. There is tenderness. There is no rebound and no guarding.  Soft abdomen with mild TTP in the LLQ and suprapubic abdomen. There is also mild TTP in the RUQ with negative Murphy's sign. No peritoneal signs or masses.  Musculoskeletal: Normal range of motion.  Neurological: She is alert and oriented to person, place, and  time.  GCS 15. Ambulatory with steady gait.  Skin: Skin is warm and dry. No rash noted. She is not diaphoretic. No erythema. No pallor.  Psychiatric: She has a normal mood and affect. Her behavior is normal.  Nursing note and vitals reviewed.    ED Treatments / Results  DIAGNOSTIC STUDIES: Oxygen Saturation is 100% on RA, normal by my interpretation.    COORDINATION OF CARE: 1:08 AM-Discussed treatment plan which includes pelvic exam and UA with pt at bedside and pt agreed to plan.    Labs (all labs ordered are listed, but only abnormal results are displayed) Labs Reviewed  WET PREP, GENITAL - Abnormal; Notable for the following:       Result Value   Clue Cells Wet Prep HPF POC PRESENT (*)    WBC, Wet Prep HPF POC MANY (*)    All other components within normal limits  URINALYSIS, ROUTINE W REFLEX MICROSCOPIC (NOT AT [facility]) - Abnormal; Notable for the following:    APPearance CLOUDY (*)    Hgb urine dipstick SMALL (*)    Leukocytes, UA SMALL (*)    All other components within normal limits  CBC WITH DIFFERENTIAL/PLATELET - Abnormal; Notable for the following:    Hemoglobin 10.9 (*)    HCT 35.6 (*)    MCV 76.6 (*)    MCH 23.4 (*)    RDW 17.8 (*)    All other components within normal limits  COMPREHENSIVE METABOLIC PANEL - Abnormal; Notable for the following:    Glucose, Bld 100 (*)    Total Protein 8.4 (*)    ALT 11 (*)    All other components within normal limits  URINE MICROSCOPIC-ADD ON - Abnormal; Notable for the following:    Squamous Epithelial / LPF 6-30 (*)    Bacteria, UA FEW (*)    All other components within normal limits  URINE CULTURE  PREGNANCY, URINE  LIPASE, BLOOD  GC/CHLAMYDIA PROBE AMP ([place]) NOT AT [facility]    EKG  EKG Interpretation None       Radiology No results found.  Procedures Procedures (including critical care time)  Medications Ordered in ED Medications - No data to display   Initial Impression / Assessment and Plan / ED  Course  I have reviewed the triage vital signs and the nursing notes.  Pertinent labs & imaging results that were available during my care of the patient were reviewed by me and considered in my medical decision making (see chart for details).  Clinical Course    30 year old female presents to the emergency department for persistent lower abdominal pain. She was diagnosed with a urinary tract infection on 06/12/2016. No signs of acute surgical abdomen on exam. Patient's CBC and chemistry panel are stable compared to prior evaluation. Urinalysis today still with too many to count white blood cells; however, overall, the  urinalysis appears improved. Urine recent for culture.   Initial culture from 06/12/16 was positive for a staphylococcal UTI. No culture sensitivity available for tailoring of antibiotics. Patient has been advised to continue Keflex for this reason. I have discussed with the patient the need for her urine to be rechecked in 2 days. She has also been advised to follow-up with the health department in 48 hours regarding the results of her STD tests. She has no complaints of vaginal discharge and denies concern for STDs. Abdominal reexamination stable. No indication for further emergent workup. Patient discharged in satisfactory condition with no unaddressed concerns.   Final Clinical Impressions(s) / ED Diagnoses   Final diagnoses:  UTI (lower urinary tract infection)  Lower abdominal pain    New Prescriptions Discharge Medication List as of 06/15/2016  3:34 AM    START taking these medications   Details  naproxen (NAPROSYN) 500 MG tablet Take 1 tablet (500 mg total) by mouth 2 (two) times daily., Starting Thu 06/15/2016, Print         I personally performed the services described in this documentation, which was scribed in my presence. The recorded information has been reviewed and is accurate.       Zakira Pahnke, PA-C [id number]    Delora Fuel, MD [id number]  [phone]

## 2016-06-15 NOTE — Discharge Instructions (Signed)
Even though your urine is still positive for white blood cells, it appears improved compared to your prior visit. For this reason, we recommend that you continue with Keflex and have your urine rechecked again in 2 days to ensure that improvement continues. Take naproxen as prescribed for pain. You have gonorrhea and chlamydia cultures pending. Follow-up with the health department in 48 hours regarding your test results. Return to the emergency department as needed for persistent or worsening symptoms.

## 2016-06-16 ENCOUNTER — Telehealth (HOSPITAL_BASED_OUTPATIENT_CLINIC_OR_DEPARTMENT_OTHER): Payer: Self-pay

## 2016-06-16 LAB — URINE CULTURE: CULTURE: NO GROWTH

## 2016-06-16 NOTE — Progress Notes (Signed)
ED Antimicrobial Stewardship Positive Culture Follow Up   Danielle Short is an 30 y.o. female who presented to [facility] on 06/14/2016 with a chief complaint of  Chief Complaint  Patient presents with  . Abdominal Pain    Recent Results (from the past 720 hour(s))  Urine culture     Status: Abnormal   Collection Time: 06/12/16 12:14 PM  Result Value Ref Range Status   Specimen Description URINE, RANDOM  Final   Special Requests NONE  Final   Culture (A)  Final    >=100,000 COLONIES/mL STAPHYLOCOCCUS SPECIES (COAGULASE NEGATIVE)   Report Status 06/15/2016 FINAL  Final   Organism ID, Bacteria STAPHYLOCOCCUS SPECIES (COAGULASE NEGATIVE) (A)  Final      Susceptibility   Staphylococcus species (coagulase negative) - MIC*    CIPROFLOXACIN <=0.5 SENSITIVE Sensitive     GENTAMICIN <=0.5 SENSITIVE Sensitive     NITROFURANTOIN <=16 SENSITIVE Sensitive     OXACILLIN 0.5 RESISTANT Resistant     TETRACYCLINE <=1 SENSITIVE Sensitive     VANCOMYCIN <=0.5 SENSITIVE Sensitive     TRIMETH/SULFA <=10 SENSITIVE Sensitive     CLINDAMYCIN <=0.25 SENSITIVE Sensitive     RIFAMPIN <=0.5 SENSITIVE Sensitive     Inducible Clindamycin NEGATIVE Sensitive     * >=100,000 COLONIES/mL STAPHYLOCOCCUS SPECIES (COAGULASE NEGATIVE)  Urine culture     Status: None   Collection Time: 06/15/16  1:16 AM  Result Value Ref Range Status   Specimen Description URINE, CLEAN CATCH  Final   Special Requests NONE  Final   Culture NO GROWTH Performed at [facility]   Final   Report Status 06/16/2016 FINAL  Final  Wet prep, genital     Status: Abnormal   Collection Time: 06/15/16  2:22 AM  Result Value Ref Range Status   Yeast Wet Prep HPF POC NONE SEEN NONE SEEN Final   Trich, Wet Prep NONE SEEN NONE SEEN Final   Clue Cells Wet Prep HPF POC PRESENT (A) NONE SEEN Final   WBC, Wet Prep HPF POC MANY (A) NONE SEEN Final   Sperm NONE SEEN  Final    [x]  Treated with cephalexin, organism resistant to prescribed  antimicrobial  New antibiotic prescription: Stop cephalexin. Take Bactrim 1 DS tablet PO BID x 3 days  ED Provider: Irena Cords, PA-C  Cassie L. Nicole Kindred, PharmD Infectious Diseases Clinical Pharmacist Pager: [phone] 06/16/2016 10:16 AM

## 2016-06-16 NOTE — Telephone Encounter (Signed)
Post ED Visit - Positive Culture Follow-up: Successful Patient Follow-Up  Culture assessed and recommendations reviewed by: []  Elenor Quinones, Pharm.D. []  Heide Guile, Pharm.D., BCPS []  Parks Neptune, Pharm.D. []  Alycia Rossetti, Pharm.D., BCPS []  [place], [place].D., BCPS, AAHIVP []  Legrand Como, Pharm.D., BCPS, AAHIVP [x]  Milus Glazier, Pharm.D. []  Stephens November, Pharm.D.  Positive uriine culture  []  Patient discharged without antimicrobial prescription and treatment is now indicated [x]  Organism is resistant to prescribed ED discharge antimicrobial []  Patient with positive blood cultures  Changes discussed with ED provider: Irena Cords PA-C  New antibiotic prescription Bactrim DS Called to [facility] [phone]  Contacted patient, date 06/16/16, time 1036   Tanis Hensarling, Carolynn Comment 06/16/2016, 10:36 AM

## 2017-07-21 ENCOUNTER — Encounter (HOSPITAL_BASED_OUTPATIENT_CLINIC_OR_DEPARTMENT_OTHER): Payer: Self-pay | Admitting: Emergency Medicine

## 2017-07-21 ENCOUNTER — Emergency Department (HOSPITAL_BASED_OUTPATIENT_CLINIC_OR_DEPARTMENT_OTHER)
Admission: EM | Admit: 2017-07-21 | Discharge: 2017-07-21 | Disposition: A | Discharge status: Home / Self Care | Payer: BLUE CROSS/BLUE SHIELD | Attending: Emergency Medicine | Admitting: Emergency Medicine

## 2017-07-21 DIAGNOSIS — D170 Benign lipomatous neoplasm of skin and subcutaneous tissue of head, face and neck: Secondary | ICD-10-CM | POA: Diagnosis not present

## 2017-07-21 DIAGNOSIS — M542 Cervicalgia: Secondary | ICD-10-CM | POA: Diagnosis present

## 2017-07-21 DIAGNOSIS — Z79899 Other long term (current) drug therapy: Secondary | ICD-10-CM | POA: Diagnosis not present

## 2017-07-21 NOTE — ED Triage Notes (Signed)
Patient states that she has a pain to the back of her neck that started to swell last week. Patient reports that her boyfriend reports that it has been there longer. PAtient is talking without difficulty and is laughing with boyfriend in triage. No other complaints at this time

## 2017-07-21 NOTE — ED Provider Notes (Signed)
[place] DEPT [facility] Provider Note   CSN: [id number] Arrival date & time: 07/21/17  1745     History   Chief Complaint Chief Complaint  Patient presents with  . Neck Pain    HPI Danielle Short is a 31 y.o. female.  Patient is a healthy 31 year old female with no significant past medical history presenting today because of a lump on the back of her neck. She noticed it last week but is unsure how long it's been there. She states occasionally it will bother her but does not always hurt. She denies any injury. It does not get red or draining. He has otherwise been feeling normal.   The history is provided by the patient.    Past Medical History:  Diagnosis Date  . Hypertension during pregnancy    There are no active problems to display for this patient.   Past Surgical History:  Procedure Laterality Date  . CESAREAN SECTION      OB History    Gravida Para Term Preterm AB Living   1             SAB TAB Ectopic Multiple Live Births                   Home Medications    Prior to Admission medications   Medication Sig Start Date End Date Taking? Authorizing Provider  cephALEXin (KEFLEX) 500 MG capsule Take 1 capsule (500 mg total) by mouth 3 (three) times daily. 06/12/16   Sam, Olivia Canter, PA-C  Cetirizine HCl 10 MG CAPS Take 1 capsule (10 mg total) by mouth daily. Patient not taking: Reported on 06/12/2016 04/17/14   Noe Gens, PA-C  fluticasone [facility]) 50 MCG/ACT nasal spray Place 1 spray into both nostrils daily. Patient not taking: Reported on 06/12/2016 04/17/14   Noe Gens, PA-C  ibuprofen (ADVIL,MOTRIN) 200 MG tablet Take 400 mg by mouth every 6 (six) hours as needed. For pain    [provider]  naproxen (NAPROSYN) 500 MG tablet Take 1 tablet (500 mg total) by mouth 2 (two) times daily. 06/15/16   Antonietta Breach, PA-C    Family History History reviewed. No pertinent family history.  Social History Social History  Substance Use Topics  .  Smoking status: Never Smoker  . Smokeless tobacco: Never Used  . Alcohol use No     Allergies   Patient has no known allergies.   Review of Systems Review of Systems  All other systems reviewed and are negative.    Physical Exam Updated Vital Signs BP (!) 144/89 (BP Location: Left Arm)   Pulse 96   Temp 99 F (37.2 C) (Oral)   Resp 18   Ht 5\' 4"  (1.626 m)   Wt 78.5 kg (173 lb)   LMP 06/30/2017   SpO2 100%   BMI 29.70 kg/m   Physical Exam  Constitutional: She is oriented to person, place, and time. She appears well-nourished.  HENT:  Head: Normocephalic and atraumatic.    Eyes: Pupils are equal, round, and reactive to light.  Neck: Normal range of motion. Neck supple.  Cardiovascular: Normal rate.   Pulmonary/Chest: Effort normal.  Neurological: She is alert and oriented to person, place, and time.  Skin: Skin is warm.  Psychiatric: She has a normal mood and affect. Her behavior is normal.  Nursing note and vitals reviewed.    ED Treatments / Results  Labs (all labs ordered are listed, but only abnormal results are displayed) Labs  Reviewed - No data to display  EKG  EKG Interpretation None       Radiology No results found.  Procedures Procedures (including critical care time)  Medications Ordered in ED Medications - No data to display   Initial Impression / Assessment and Plan / ED Course  I have reviewed the triage vital signs and the nursing notes.  Pertinent labs & imaging results that were available during my care of the patient were reviewed by me and considered in my medical decision making (see chart for details).     Patient presenting today because of a lump on the back of her neck which is most consistent with a lipoma. There is no evidence of infection, abscess or other concerning findings. She has no other complaints. Patient was given follow-up for general surgery if this continues to grow and causes more discomfort.  Final  Clinical Impressions(s) / ED Diagnoses   Final diagnoses:  Lipoma of neck    New Prescriptions Discharge Medication List as of 07/21/2017  7:16 PM       Blanchie Dessert, MD 07/21/17 2006

## 2019-06-16 ENCOUNTER — Ambulatory Visit (INDEPENDENT_AMBULATORY_CARE_PROVIDER_SITE_OTHER): Payer: Medicaid Other

## 2019-06-16 ENCOUNTER — Other Ambulatory Visit: Payer: Self-pay

## 2019-06-16 ENCOUNTER — Encounter: Payer: Self-pay | Admitting: Obstetrics

## 2019-06-16 DIAGNOSIS — Z3200 Encounter for pregnancy test, result unknown: Secondary | ICD-10-CM

## 2019-06-16 DIAGNOSIS — Z3201 Encounter for pregnancy test, result positive: Secondary | ICD-10-CM

## 2019-06-16 LAB — POCT URINE PREGNANCY: Preg Test, Ur: POSITIVE — AB

## 2019-06-16 MED ORDER — PRENATAL GUMMIES/DHA & FA 0.4-32.5 MG PO CHEW: 3.0000 | CHEWABLE_TABLET | Freq: Every day | ORAL | 12 refills | Status: AC

## 2019-06-16 NOTE — Progress Notes (Signed)
.   Ms. Wyffels presents today for UPT. She has no unusual complaints. LMP: 05-08-19    OBJECTIVE: Appears well, in no apparent distress.  OB History    Gravida  4   Para  2   Term  1   Preterm  1   AB  1   Living  1     SAB      TAB  1   Ectopic      Multiple      Live Births  1        Obstetric Comments  Son passed away April 26, 2010       Home UPT Result: Positive In-Office UPT result:Positive I have reviewed the patient's medical, obstetrical, social, and family histories, and medications.   ASSESSMENT: Positive pregnancy test  PLAN Prenatal care to be completed at: [facility]

## 2019-06-16 NOTE — Addendum Note (Signed)
Addended by: Tristan Schroeder D on: 06/16/2019 03:52 PM   Modules accepted: Orders

## 2019-07-08 ENCOUNTER — Ambulatory Visit (INDEPENDENT_AMBULATORY_CARE_PROVIDER_SITE_OTHER): Payer: Self-pay

## 2019-07-08 ENCOUNTER — Other Ambulatory Visit: Payer: Self-pay

## 2019-07-08 DIAGNOSIS — O0990 Supervision of high risk pregnancy, unspecified, unspecified trimester: Secondary | ICD-10-CM

## 2019-07-08 MED ORDER — BLOOD PRESSURE CUFF MISC: 1.0000 | 0 refills | Status: DC

## 2019-07-08 MED ORDER — DOXYLAMINE-PYRIDOXINE 10-10 MG PO TBEC: 2.0000 | DELAYED_RELEASE_TABLET | Freq: Every day | ORAL | 5 refills | Status: DC

## 2019-07-08 NOTE — Progress Notes (Signed)
Patient seen and assessed by nursing staff during this encounter. I have reviewed the chart and agree with the documentation and plan.  Mora Bellman, MD 07/08/2019 2:12 PM

## 2019-07-08 NOTE — Progress Notes (Signed)
Pt is on the phone with nurse for OB interview intake. LMP is 05/08/2019, EDD 02/12/2020. Pt has PMH of pregnancy induced hypertension. Pt reports she checked her BP at her moms house yesterday and it was 130/82, pt denies any HA's or blurry vision. Pt reports she had a pap smear done last year at another office, I advised patient to get those records sent over before her New OB visit next month, pt verbalizes understanding. Pt instructed to go to [facility] and [place] [facility] for any emergency care during her pregnancy, she verbalizes understanding.

## 2019-07-15 ENCOUNTER — Telehealth: Payer: Self-pay

## 2019-07-15 NOTE — Telephone Encounter (Signed)
Spoke with patient to get her MCD insurance Information, she stated that she has not received her Medicaid Card. I informed patient that I need the number to process the PA for her medications. She verbalized understanding and promised to give the number to [place].

## 2019-07-16 ENCOUNTER — Encounter (HOSPITAL_BASED_OUTPATIENT_CLINIC_OR_DEPARTMENT_OTHER): Payer: Self-pay

## 2019-07-16 ENCOUNTER — Other Ambulatory Visit: Payer: Self-pay

## 2019-07-16 ENCOUNTER — Emergency Department (HOSPITAL_BASED_OUTPATIENT_CLINIC_OR_DEPARTMENT_OTHER)
Admission: EM | Admit: 2019-07-16 | Discharge: 2019-07-17 | Disposition: A | Discharge status: Home / Self Care | Payer: Medicaid Other | Attending: Emergency Medicine | Admitting: Emergency Medicine

## 2019-07-16 DIAGNOSIS — O23591 Infection of other part of genital tract in pregnancy, first trimester: Secondary | ICD-10-CM | POA: Insufficient documentation

## 2019-07-16 DIAGNOSIS — R102 Pelvic and perineal pain: Secondary | ICD-10-CM | POA: Diagnosis not present

## 2019-07-16 DIAGNOSIS — O131 Gestational [pregnancy-induced] hypertension without significant proteinuria, first trimester: Secondary | ICD-10-CM | POA: Diagnosis not present

## 2019-07-16 DIAGNOSIS — Z3201 Encounter for pregnancy test, result positive: Secondary | ICD-10-CM | POA: Insufficient documentation

## 2019-07-16 DIAGNOSIS — O9989 Other specified diseases and conditions complicating pregnancy, childbirth and the puerperium: Secondary | ICD-10-CM | POA: Diagnosis not present

## 2019-07-16 DIAGNOSIS — Z79899 Other long term (current) drug therapy: Secondary | ICD-10-CM | POA: Insufficient documentation

## 2019-07-16 DIAGNOSIS — Z3A11 11 weeks gestation of pregnancy: Secondary | ICD-10-CM | POA: Diagnosis not present

## 2019-07-16 DIAGNOSIS — B9689 Other specified bacterial agents as the cause of diseases classified elsewhere: Secondary | ICD-10-CM

## 2019-07-16 DIAGNOSIS — Z3401 Encounter for supervision of normal first pregnancy, first trimester: Secondary | ICD-10-CM

## 2019-07-16 LAB — CBC WITH DIFFERENTIAL/PLATELET
Abs Immature Granulocytes: 0.04 10*3/uL (ref 0.00–0.07)
Basophils Absolute: 0 10*3/uL (ref 0.0–0.1)
Basophils Relative: 0 %
Eosinophils Absolute: 0.1 10*3/uL (ref 0.0–0.5)
Eosinophils Relative: 0 %
HCT: 35.9 % — ABNORMAL LOW (ref 36.0–46.0)
Hemoglobin: 10.6 g/dL — ABNORMAL LOW (ref 12.0–15.0)
Immature Granulocytes: 0 %
Lymphocytes Relative: 15 %
Lymphs Abs: 1.7 10*3/uL (ref 0.7–4.0)
MCH: 21.5 pg — ABNORMAL LOW (ref 26.0–34.0)
MCHC: 29.5 g/dL — ABNORMAL LOW (ref 30.0–36.0)
MCV: 72.8 fL — ABNORMAL LOW (ref 80.0–100.0)
Monocytes Absolute: 0.9 10*3/uL (ref 0.1–1.0)
Monocytes Relative: 8 %
Neutro Abs: 8.6 10*3/uL — ABNORMAL HIGH (ref 1.7–7.7)
Neutrophils Relative %: 77 %
Platelets: 293 10*3/uL (ref 150–400)
RBC: 4.93 MIL/uL (ref 3.87–5.11)
RDW: 19.4 % — ABNORMAL HIGH (ref 11.5–15.5)
WBC: 11.3 10*3/uL — ABNORMAL HIGH (ref 4.0–10.5)
nRBC: 0 % (ref 0.0–0.2)

## 2019-07-16 LAB — URINALYSIS, ROUTINE W REFLEX MICROSCOPIC
Bilirubin Urine: NEGATIVE
Glucose, UA: NEGATIVE mg/dL
Hgb urine dipstick: NEGATIVE
Ketones, ur: NEGATIVE mg/dL
Leukocytes,Ua: NEGATIVE
Nitrite: NEGATIVE
Protein, ur: NEGATIVE mg/dL
Specific Gravity, Urine: 1.025 (ref 1.005–1.030)
pH: 6.5 (ref 5.0–8.0)

## 2019-07-16 LAB — COMPREHENSIVE METABOLIC PANEL
ALT: 11 U/L (ref 0–44)
AST: 19 U/L (ref 15–41)
Albumin: 3.7 g/dL (ref 3.5–5.0)
Alkaline Phosphatase: 40 U/L (ref 38–126)
Anion gap: 10 (ref 5–15)
BUN: 14 mg/dL (ref 6–20)
CO2: 21 mmol/L — ABNORMAL LOW (ref 22–32)
Calcium: 9.4 mg/dL (ref 8.9–10.3)
Chloride: 99 mmol/L (ref 98–111)
Creatinine, Ser: 0.6 mg/dL (ref 0.44–1.00)
GFR calc Af Amer: >60 mL/min (ref >60)
GFR calc non Af Amer: >60 mL/min (ref >60)
Glucose, Bld: 98 mg/dL (ref 70–99)
Potassium: 3.6 mmol/L (ref 3.5–5.1)
Sodium: 130 mmol/L — ABNORMAL LOW (ref 135–145)
Total Bilirubin: 0.4 mg/dL (ref 0.3–1.2)
Total Protein: 7.8 g/dL (ref 6.5–8.1)

## 2019-07-16 LAB — WET PREP, GENITAL
Sperm: NONE SEEN
Trich, Wet Prep: NONE SEEN
Yeast Wet Prep HPF POC: NONE SEEN

## 2019-07-16 LAB — PREGNANCY, URINE: Preg Test, Ur: POSITIVE — AB

## 2019-07-16 LAB — HCG, QUANTITATIVE, PREGNANCY: hCG, Beta Chain, Quant, S: 149280 m[IU]/mL — ABNORMAL HIGH (ref <5)

## 2019-07-16 LAB — LIPASE, BLOOD: Lipase: 24 U/L (ref 11–51)

## 2019-07-16 MED ORDER — ACETAMINOPHEN 500 MG PO TABS
1000.0000 mg | ORAL_TABLET | Freq: Once | ORAL | Status: AC
  Administered 2019-07-16: 1000 mg via ORAL
  Filled 2019-07-16: qty 2

## 2019-07-16 MED ORDER — METRONIDAZOLE 500 MG PO TABS: 500.0000 mg | ORAL_TABLET | Freq: Two times a day (BID) | ORAL | 0 refills | Status: DC

## 2019-07-16 MED ORDER — METRONIDAZOLE 500 MG PO TABS
500.0000 mg | ORAL_TABLET | Freq: Once | ORAL | Status: AC
  Administered 2019-07-16: 500 mg via ORAL
  Filled 2019-07-16: qty 1

## 2019-07-16 NOTE — ED Provider Notes (Addendum)
[facility]ERGENCY DEPARTMENT Provider Note   CSN: [id number] Arrival date & time: 07/16/19  2153     History   Chief Complaint Chief Complaint  Patient presents with  . Abdominal Pain    [redacted] weeks pregnant    HPI Danielle Short is a 33 y.o. female.     The history is provided by the patient.  Abdominal Pain Pain location: right of center, suparpubic. Pain quality: cramping   Pain radiates to:  Does not radiate Pain severity:  Severe Onset quality:  Gradual Duration:  2 days Timing:  Constant Progression:  Unchanged Chronicity:  New Context: not alcohol use   Relieved by:  Nothing Worsened by:  Nothing Ineffective treatments:  None tried Associated symptoms: no anorexia, no belching, no chest pain, no chills, no constipation, no cough, no diarrhea, no dysuria, no fatigue, no fever, no flatus, no hematemesis, no hematochezia, no hematuria, no melena, no nausea, no shortness of breath, no sore throat, no vaginal bleeding, no vaginal discharge and no vomiting   Risk factors: pregnancy   G4P2 by LMP and has not yet had [place]. Pain is right or center and pelvic.  No f/c/r.  No trauma. No urinary symptoms.  Past Medical History:  Diagnosis Date  . Hypertension during pregnancy    Patient Active Problem List   Diagnosis Date Noted  . Supervision of high risk pregnancy, antepartum 07/08/2019    Past Surgical History:  Procedure Laterality Date  . CESAREAN SECTION       OB History    Gravida  4   Para  2   Term  1   Preterm  1   AB  1   Living  1     SAB      TAB  1   Ectopic      Multiple      Live Births  1        Obstetric Comments  Son passed away 05/17/10         Home Medications    Prior to Admission medications   Medication Sig Start Date End Date Taking? Authorizing Provider  Blood Pressure Monitoring (BLOOD PRESSURE CUFF) MISC 1 Device by Does not apply route once a week. 07/08/19   Shelly Bombard, MD  cephALEXin  (KEFLEX) 500 MG capsule Take 1 capsule (500 mg total) by mouth 3 (three) times daily. 06/12/16   Sam, Olivia Canter, PA-C  Cetirizine HCl 10 MG CAPS Take 1 capsule (10 mg total) by mouth daily. Patient not taking: Reported on 06/12/2016 04/17/14   Noe Gens, PA-C  Doxylamine-Pyridoxine (DICLEGIS) 10-10 MG TBEC Take 2 tablets by mouth at bedtime. If symptoms persist, add one tablet in the morning and one in the afternoon 07/08/19   Shelly Bombard, MD  fluticasone [facility]) 50 MCG/ACT nasal spray Place 1 spray into both nostrils daily. Patient not taking: Reported on 06/12/2016 04/17/14   Noe Gens, PA-C  ibuprofen (ADVIL,MOTRIN) 200 MG tablet Take 400 mg by mouth every 6 (six) hours as needed. For pain    [provider]  metroNIDAZOLE (FLAGYL) 500 MG tablet Take 1 tablet (500 mg total) by mouth 2 (two) times daily. One po bid x 7 days 07/16/19   Bertel Venard, MD  naproxen (NAPROSYN) 500 MG tablet Take 1 tablet (500 mg total) by mouth 2 (two) times daily. 06/15/16   Antonietta Breach, PA-C  Prenatal MV-Min-FA-Omega-3 (PRENATAL GUMMIES/DHA & FA) 0.4-32.5 MG CHEW Chew 3 tablets  by mouth daily. 06/16/19   Shelly Bombard, MD    Family History Family History  Problem Relation Age of Onset  . Diabetes Mother   . Hypertension Mother   . Kidney disease Mother   . Cancer Father     Social History Social History   Tobacco Use  . Smoking status: Never Smoker  . Smokeless tobacco: Never Used  Substance Use Topics  . Alcohol use: No  . Drug use: No     Allergies   Patient has no known allergies.   Review of Systems Review of Systems  Constitutional: Negative for chills, fatigue and fever.  HENT: Negative for sore throat.   Eyes: Negative for visual disturbance.  Respiratory: Negative for cough and shortness of breath.   Cardiovascular: Negative for chest pain.  Gastrointestinal: Positive for abdominal pain. Negative for anorexia, constipation, diarrhea, flatus, hematemesis,  hematochezia, melena, nausea and vomiting.  Genitourinary: Positive for pelvic pain. Negative for dysuria, hematuria, vaginal bleeding and vaginal discharge.  Musculoskeletal: Negative for arthralgias.  Psychiatric/Behavioral: Negative for agitation.  All other systems reviewed and are negative.    Physical Exam Updated Vital Signs BP 137/84 (BP Location: Left Arm)   Pulse 83   Temp 99.6 F (37.6 C) (Oral)   Resp 18   Ht 5\' 4"  (1.626 m)   Wt 74.8 kg   LMP 05/08/2019 (Exact Date)   SpO2 100%   BMI 28.32 kg/m   Physical Exam Vitals signs and nursing note reviewed. Exam conducted with a chaperone present.  Constitutional:      General: She is not in acute distress.    Appearance: She is normal weight.  HENT:     Head: Normocephalic and atraumatic.     Nose: Nose normal.  Eyes:     Conjunctiva/sclera: Conjunctivae normal.     Pupils: Pupils are equal, round, and reactive to light.  Neck:     Musculoskeletal: Normal range of motion and neck supple.  Cardiovascular:     Rate and Rhythm: Normal rate and regular rhythm.     Pulses: Normal pulses.     Heart sounds: Normal heart sounds.  Pulmonary:     Effort: Pulmonary effort is normal.     Breath sounds: Normal breath sounds.  Abdominal:     General: Abdomen is flat. Bowel sounds are normal.     Tenderness: There is no abdominal tenderness. There is no guarding or rebound.  Genitourinary:    Cervix: Normal.     Uterus: Normal.      Adnexa:        Right: No tenderness.         Left: No tenderness.    Musculoskeletal: Normal range of motion.  Skin:    General: Skin is warm and dry.     Capillary Refill: Capillary refill takes less than 2 seconds.  Neurological:     General: No focal deficit present.     Mental Status: She is alert.  Psychiatric:        Mood and Affect: Mood normal.        Behavior: Behavior normal.      ED Treatments / Results  Labs (all labs ordered are listed, but only abnormal results are  displayed) Labs Reviewed  WET PREP, GENITAL - Abnormal; Notable for the following components:      Result Value   Clue Cells Wet Prep HPF POC PRESENT (*)    WBC, Wet Prep HPF POC FEW (*)  All other components within normal limits  URINALYSIS, ROUTINE W REFLEX MICROSCOPIC - Abnormal; Notable for the following components:   APPearance CLOUDY (*)    All other components within normal limits  PREGNANCY, URINE - Abnormal; Notable for the following components:   Preg Test, Ur POSITIVE (*)    All other components within normal limits  CBC WITH DIFFERENTIAL/PLATELET - Abnormal; Notable for the following components:   WBC 11.3 (*)    Hemoglobin 10.6 (*)    HCT 35.9 (*)    MCV 72.8 (*)    MCH 21.5 (*)    MCHC 29.5 (*)    RDW 19.4 (*)    Neutro Abs 8.6 (*)    All other components within normal limits  COMPREHENSIVE METABOLIC PANEL - Abnormal; Notable for the following components:   Sodium 130 (*)    CO2 21 (*)    All other components within normal limits  LIPASE, BLOOD  HCG, QUANTITATIVE, PREGNANCY  GC/CHLAMYDIA PROBE AMP ([place]) NOT AT [facility]    EKG None  Radiology No results found.  Procedures Procedures (including critical care time)  Medications Ordered in ED Medications  acetaminophen (TYLENOL) tablet 1,000 mg (has no administration in time range)  metroNIDAZOLE (FLAGYL) tablet 500 mg (has no administration in time range)     Case d/w Dr. Hulan Fray send to [facility] ED, not the [facility] Case d/w Dr. Roslynn Amble who accepts in the ED at [facility] was evaluated in Emergency Department on 07/16/2019 for the symptoms described in the history of present illness. She was evaluated in the context of the global COVID-19 pandemic, which necessitated consideration that the patient might be at risk for infection with the SARS-CoV-2 virus that causes COVID-19. Institutional protocols and algorithms that pertain to the evaluation of patients at risk for COVID-19 are in a state of rapid  change based on information released by regulatory bodies including the CDC and federal and state organizations. These policies and algorithms were followed during the patient's care in the ED.  Final Clinical Impressions(s) / ED Diagnoses   Final diagnoses:  Bacterial vaginosis  Pregnancy, location unknown   Return for intractable cough, coughing up blood,fevers >100.4 unrelieved by medication, shortness of breath, intractable vomiting, chest pain, shortness of breath, weakness,numbness, changes in speech, facial asymmetry,abdominal pain, passing out,Inability to tolerate liquids or food, cough, altered mental status or any concerns. No signs of systemic illness or infection. The patient is nontoxic-appearing on exam and vital signs are within normal limits.   I have reviewed the triage vital signs and the nursing notes. Pertinent labs &imaging results that were available during my care of the patient were reviewed by me and considered in my medical decision making (see chart for details).  After history, exam, and medical workup I feel the patient has been appropriately medically screened and is safe for discharge home. Pertinent diagnoses were discussed with the patient. Patient was given return precautions ED Discharge Orders         Ordered    metroNIDAZOLE (FLAGYL) 500 MG tablet  2 times daily     07/16/19 2331           Amelie Caracci, MD 07/16/19 2336    Randal Buba, Haaris Metallo, MD 07/17/19 0100

## 2019-07-16 NOTE — ED Notes (Signed)
Called report to Charge nurse at [facility] ED. Pt accepted by Dr. Roslynn Amble.

## 2019-07-16 NOTE — ED Notes (Signed)
Pt instructed to promptly report to [facility] for [place]. Pt encourage to remain NPO.

## 2019-07-16 NOTE — ED Triage Notes (Addendum)
Pt c/o lower abd pain started last night-pt states she is [redacted] weeks pregnant-did not notify OB-states she had televisit only with first in person appt 9/2-NAD-steady gait

## 2019-07-17 ENCOUNTER — Emergency Department (HOSPITAL_COMMUNITY): Payer: Medicaid Other

## 2019-07-17 NOTE — Discharge Instructions (Signed)
Follow-up with your ob-gyn.  Continue prenatal vitamins.  Return to [facility] hospital for any new/acute changes related to pregnancy-- bleeding, pain, etc.

## 2019-07-17 NOTE — ED Notes (Signed)
Patient went to [facility] for lower abdominal pain. Denies bleeding. Had some discharge was was dx with bacterial vaginosis. Sent her for [place] to make sure that the baby is healthy.

## 2019-07-17 NOTE — ED Provider Notes (Signed)
[facility] EMERGENCY DEPARTMENT Provider Note   CSN: [id number] Arrival date & time: 07/16/19  2153     History   Chief Complaint Chief Complaint  Patient presents with  . Abdominal Pain    [redacted] weeks pregnant    HPI Danielle Short is a 33 y.o. female.     The history is provided by the patient and medical records.     33 year old G4P2 estimated around [redacted] weeks gestation, presenting to the ED for lower abdominal cramping.  She was seen at [facility] and had labs and pelvic exam performed and found to have BV.  Ultrasound was unable to be obtained so she was transferred here for further evaluation.  She has not had any irregular bleeding or vomiting.  She has already started prenatal vitamins and has an OB/GYN that she will be seeing.  Past Medical History:  Diagnosis Date  . Hypertension during pregnancy    Patient Active Problem List   Diagnosis Date Noted  . Supervision of high risk pregnancy, antepartum 07/08/2019    Past Surgical History:  Procedure Laterality Date  . CESAREAN SECTION       OB History    Gravida  4   Para  2   Term  1   Preterm  1   AB  1   Living  1     SAB      TAB  1   Ectopic      Multiple      Live Births  1        Obstetric Comments  Son passed away 05/09/2010         Home Medications    Prior to Admission medications   Medication Sig Start Date End Date Taking? Authorizing Provider  Blood Pressure Monitoring (BLOOD PRESSURE CUFF) MISC 1 Device by Does not apply route once a week. 07/08/19   Shelly Bombard, MD  cephALEXin (KEFLEX) 500 MG capsule Take 1 capsule (500 mg total) by mouth 3 (three) times daily. 06/12/16   Sam, Olivia Canter, PA-C  Cetirizine HCl 10 MG CAPS Take 1 capsule (10 mg total) by mouth daily. Patient not taking: Reported on 06/12/2016 04/17/14   Noe Gens, PA-C  Doxylamine-Pyridoxine (DICLEGIS) 10-10 MG TBEC Take 2 tablets by mouth at bedtime. If symptoms persist,  add one tablet in the morning and one in the afternoon 07/08/19   Shelly Bombard, MD  fluticasone [facility]) 50 MCG/ACT nasal spray Place 1 spray into both nostrils daily. Patient not taking: Reported on 06/12/2016 04/17/14   Noe Gens, PA-C  ibuprofen (ADVIL,MOTRIN) 200 MG tablet Take 400 mg by mouth every 6 (six) hours as needed. For pain    [provider]  metroNIDAZOLE (FLAGYL) 500 MG tablet Take 1 tablet (500 mg total) by mouth 2 (two) times daily. One po bid x 7 days 07/16/19   Palumbo, April, MD  naproxen (NAPROSYN) 500 MG tablet Take 1 tablet (500 mg total) by mouth 2 (two) times daily. 06/15/16   Antonietta Breach, PA-C  Prenatal MV-Min-FA-Omega-3 (PRENATAL GUMMIES/DHA & FA) 0.4-32.5 MG CHEW Chew 3 tablets by mouth daily. 06/16/19   Shelly Bombard, MD    Family History Family History  Problem Relation Age of Onset  . Diabetes Mother   . Hypertension Mother   . Kidney disease Mother   . Cancer Father     Social History Social History   Tobacco Use  . Smoking status:  Never Smoker  . Smokeless tobacco: Never Used  Substance Use Topics  . Alcohol use: No  . Drug use: No     Allergies   Patient has no known allergies.   Review of Systems Review of Systems  Gastrointestinal: Positive for abdominal pain.  All other systems reviewed and are negative.    Physical Exam Updated Vital Signs BP 133/90 (BP Location: Right Arm)   Pulse 79   Temp 98.7 F (37.1 C) (Oral)   Resp 16   Ht 5\' 4"  (1.626 m)   Wt 74.8 kg   LMP 05/08/2019 (Exact Date)   SpO2 100%   BMI 28.32 kg/m   Physical Exam Vitals signs and nursing note reviewed.  Constitutional:      Appearance: She is well-developed.  HENT:     Head: Normocephalic and atraumatic.  Eyes:     Conjunctiva/sclera: Conjunctivae normal.     Pupils: Pupils are equal, round, and reactive to light.  Neck:     Musculoskeletal: Normal range of motion.  Cardiovascular:     Rate and Rhythm: Normal rate and  regular rhythm.     Heart sounds: Normal heart sounds.  Pulmonary:     Effort: Pulmonary effort is normal.     Breath sounds: Normal breath sounds.  Abdominal:     General: Bowel sounds are normal.     Palpations: Abdomen is soft.     Tenderness: There is no abdominal tenderness. There is no guarding or rebound.  Musculoskeletal: Normal range of motion.  Skin:    General: Skin is warm and dry.  Neurological:     Mental Status: She is alert and oriented to person, place, and time.      ED Treatments / Results  Labs (all labs ordered are listed, but only abnormal results are displayed) Labs Reviewed  WET PREP, GENITAL - Abnormal; Notable for the following components:      Result Value   Clue Cells Wet Prep HPF POC PRESENT (*)    WBC, Wet Prep HPF POC FEW (*)    All other components within normal limits  URINALYSIS, ROUTINE W REFLEX MICROSCOPIC - Abnormal; Notable for the following components:   APPearance CLOUDY (*)    All other components within normal limits  PREGNANCY, URINE - Abnormal; Notable for the following components:   Preg Test, Ur POSITIVE (*)    All other components within normal limits  CBC WITH DIFFERENTIAL/PLATELET - Abnormal; Notable for the following components:   WBC 11.3 (*)    Hemoglobin 10.6 (*)    HCT 35.9 (*)    MCV 72.8 (*)    MCH 21.5 (*)    MCHC 29.5 (*)    RDW 19.4 (*)    Neutro Abs 8.6 (*)    All other components within normal limits  COMPREHENSIVE METABOLIC PANEL - Abnormal; Notable for the following components:   Sodium 130 (*)    CO2 21 (*)    All other components within normal limits  HCG, QUANTITATIVE, PREGNANCY - Abnormal; Notable for the following components:   hCG, Beta Chain, Quant, S 149,280 (*)    All other components within normal limits  LIPASE, BLOOD  GC/CHLAMYDIA PROBE AMP ([place]) NOT AT [facility]    EKG None  Radiology US Ob Limited  Result Date: 07/17/2019 CLINICAL DATA:  Abdominal pain EXAM: OBSTETRIC <14 WK  ULTRASOUND TECHNIQUE: Transabdominal ultrasound was performed for evaluation of the gestation as well as the maternal uterus and adnexal regions. COMPARISON:  None. FINDINGS: Intrauterine gestational sac: Single intrauterine pregnancy Yolk sac:  Visible Embryo:  Visible Cardiac Activity: Visible Heart Rate: 178 bpm CRL: 36.7 mm   10 w 4 d                  [place] EDC: 02/08/2020 Subchorionic hemorrhage:  None visualized. Maternal uterus/adnexae: Bilateral ovaries are nonvisualized. Multiple fibroids within the uterus. The largest is exophytic on the right side and measures 9 x 7.8 cm. No significant free fluid. IMPRESSION: 1. Single viable intrauterine pregnancy as above. 2. Multiple uterine fibroid with dominant fibroid measuring up to 9 cm. 3. Nonvisualized ovaries. Electronically Signed   By: Donavan Foil M.D.   On: 07/17/2019 04:01    Procedures Procedures (including critical care time)  Medications Ordered in ED Medications  acetaminophen (TYLENOL) tablet 1,000 mg (1,000 mg Oral Given 07/16/19 2347)  metroNIDAZOLE (FLAGYL) tablet 500 mg (500 mg Oral Given 07/16/19 2347)     Initial Impression / Assessment and Plan / ED Course  I have reviewed the triage vital signs and the nursing notes.  Pertinent labs & imaging results that were available during my care of the patient were reviewed by me and considered in my medical decision making (see chart for details).  33 year old female transferred here from [facility] for ultrasound.  Labs from earlier are overall reassuring.  Did have clue cells noted on wet prep so was started on Flagyl.  She is already taking prenatals.  Ultrasound obtained here revealing single IUP approximately [redacted]w[redacted]d without any complications.  She does have multiple uterine fibroids.  Results discussed with patient, she is reassured.  She will continue prenatal vitamins and follow-up closely with her OB/GYN.  She will return to [facility] sooner for any acute changes  related to pregnancy such as bleeding, worsening pain, etc.  Final Clinical Impressions(s) / ED Diagnoses   Final diagnoses:  Bacterial vaginosis  Pregnancy, first, first trimester    ED Discharge Orders         Ordered    metroNIDAZOLE (FLAGYL) 500 MG tablet  2 times daily     07/16/19 2331           Larene Pickett, PA-C 07/17/19 0413    Mesner, Corene Cornea, MD 07/17/19 [id number]

## 2019-07-18 LAB — GC/CHLAMYDIA PROBE AMP (~~LOC~~) NOT AT ARMC
Chlamydia: NEGATIVE
Neisseria Gonorrhea: NEGATIVE

## 2019-07-23 ENCOUNTER — Other Ambulatory Visit: Payer: Self-pay

## 2019-07-23 ENCOUNTER — Encounter: Payer: Self-pay | Admitting: Certified Nurse Midwife

## 2019-07-23 ENCOUNTER — Ambulatory Visit (INDEPENDENT_AMBULATORY_CARE_PROVIDER_SITE_OTHER): Payer: Medicaid Other | Admitting: Certified Nurse Midwife

## 2019-07-23 VITALS — BP 142/88 | HR 101 | Wt 172.2 lb

## 2019-07-23 DIAGNOSIS — O0990 Supervision of high risk pregnancy, unspecified, unspecified trimester: Secondary | ICD-10-CM

## 2019-07-23 DIAGNOSIS — O10911 Unspecified pre-existing hypertension complicating pregnancy, first trimester: Secondary | ICD-10-CM

## 2019-07-23 DIAGNOSIS — Z3A10 10 weeks gestation of pregnancy: Secondary | ICD-10-CM | POA: Diagnosis not present

## 2019-07-23 DIAGNOSIS — O34219 Maternal care for unspecified type scar from previous cesarean delivery: Secondary | ICD-10-CM

## 2019-07-23 DIAGNOSIS — Z362 Encounter for other antenatal screening follow-up: Secondary | ICD-10-CM

## 2019-07-23 DIAGNOSIS — O99011 Anemia complicating pregnancy, first trimester: Secondary | ICD-10-CM

## 2019-07-23 DIAGNOSIS — O0991 Supervision of high risk pregnancy, unspecified, first trimester: Secondary | ICD-10-CM

## 2019-07-23 DIAGNOSIS — D259 Leiomyoma of uterus, unspecified: Secondary | ICD-10-CM

## 2019-07-23 DIAGNOSIS — O99019 Anemia complicating pregnancy, unspecified trimester: Secondary | ICD-10-CM

## 2019-07-23 DIAGNOSIS — O10919 Unspecified pre-existing hypertension complicating pregnancy, unspecified trimester: Secondary | ICD-10-CM

## 2019-07-23 DIAGNOSIS — D509 Iron deficiency anemia, unspecified: Secondary | ICD-10-CM

## 2019-07-23 MED ORDER — COMFORT FIT MATERNITY SUPP LG MISC: 1.0000 [IU] | Freq: Every day | 0 refills | Status: DC

## 2019-07-23 MED ORDER — ASPIRIN EC 81 MG PO TBEC: 81.0000 mg | DELAYED_RELEASE_TABLET | Freq: Every day | ORAL | 0 refills | Status: DC

## 2019-07-23 NOTE — Patient Instructions (Signed)
Safe Medications in Pregnancy   Acne: Benzoyl Peroxide Salicylic Acid  Backache/Headache: Tylenol: 2 regular strength every 4 hours OR              2 Extra strength every 6 hours  Colds/Coughs/Allergies: Benadryl (alcohol free) 25 mg every 6 hours as needed Breath right strips Claritin Cepacol throat lozenges Chloraseptic throat spray Cold-Eeze- up to three times per day Cough drops, alcohol free Flonase (by prescription only) Guaifenesin Mucinex Robitussin DM (plain only, alcohol free) Saline nasal spray/drops Sudafed (pseudoephedrine) & Actifed ** use only after [redacted] weeks gestation and if you do not have high blood pressure Tylenol Vicks Vaporub Zinc lozenges Zyrtec   Constipation: Colace Ducolax suppositories Fleet enema Glycerin suppositories Metamucil Milk of magnesia Miralax Senokot Smooth move tea  Diarrhea: Kaopectate Imodium A-D  *NO pepto Bismol  Hemorrhoids: Anusol Anusol HC Preparation H Tucks  Indigestion: Tums Maalox Mylanta Zantac  Pepcid  Insomnia: Benadryl (alcohol free) 25mg every 6 hours as needed Tylenol PM Unisom, no Gelcaps  Leg Cramps: Tums MagGel  Nausea/Vomiting:  Bonine Dramamine Emetrol Ginger extract Sea bands Meclizine  Nausea medication to take during pregnancy:  Unisom (doxylamine succinate 25 mg tablets) Take one tablet daily at bedtime. If symptoms are not adequately controlled, the dose can be increased to a maximum recommended dose of two tablets daily (1/2 tablet in the morning, 1/2 tablet mid-afternoon and one at bedtime). Vitamin B6 100mg tablets. Take one tablet twice a day (up to 200 mg per day).  Skin Rashes: Aveeno products Benadryl cream or 25mg every 6 hours as needed Calamine Lotion 1% cortisone cream  Yeast infection: Gyne-lotrimin 7 Monistat 7   **If taking multiple medications, please check labels to avoid duplicating the same active ingredients **take medication as directed on  the label ** Do not exceed 4000 mg of tylenol in 24 hours **Do not take medications that contain aspirin or ibuprofen     First Trimester of Pregnancy  The first trimester of pregnancy is from week 1 until the end of week 13 (months 1 through 3). During this time, your baby will begin to develop inside you. At 6-8 weeks, the eyes and face are formed, and the heartbeat can be seen on ultrasound. At the end of 12 weeks, all the baby's organs are formed. Prenatal care is all the medical care you receive before the birth of your baby. Make sure you get good prenatal care and follow all of your doctor's instructions. Follow these instructions at home: Medicines  Take over-the-counter and prescription medicines only as told by your doctor. Some medicines are safe and some medicines are not safe during pregnancy.  Take a prenatal vitamin that contains at least 600 micrograms (mcg) of folic acid.  If you have trouble pooping (constipation), take medicine that will make your stool soft (stool softener) if your doctor approves. Eating and drinking   Eat regular, healthy meals.  Your doctor will tell you the amount of weight gain that is right for you.  Avoid raw meat and uncooked cheese.  If you feel sick to your stomach (nauseous) or throw up (vomit): ? Eat 4 or 5 small meals a day instead of 3 large meals. ? Try eating a few soda crackers. ? Drink liquids between meals instead of during meals.  To prevent constipation: ? Eat foods that are high in fiber, like fresh fruits and vegetables, whole grains, and beans. ? Drink enough fluids to keep your pee (urine) clear or pale yellow.   Activity  Exercise only as told by your doctor. Stop exercising if you have cramps or pain in your lower belly (abdomen) or low back.  Do not exercise if it is too hot, too humid, or if you are in a place of great height (high altitude).  Try to avoid standing for long periods of time. Move your legs often  if you must stand in one place for a long time.  Avoid heavy lifting.  Wear low-heeled shoes. Sit and stand up straight.  You can have sex unless your doctor tells you not to. Relieving pain and discomfort  Wear a good support bra if your breasts are sore.  Take warm water baths (sitz baths) to soothe pain or discomfort caused by hemorrhoids. Use hemorrhoid cream if your doctor says it is okay.  Rest with your legs raised if you have leg cramps or low back pain.  If you have puffy, bulging veins (varicose veins) in your legs: ? Wear support hose or compression stockings as told by your doctor. ? Raise (elevate) your feet for 15 minutes, 3-4 times a day. ? Limit salt in your food. Prenatal care  Schedule your prenatal visits by the twelfth week of pregnancy.  Write down your questions. Take them to your prenatal visits.  Keep all your prenatal visits as told by your doctor. This is important. Safety  Wear your seat belt at all times when driving.  Make a list of emergency phone numbers. The list should include numbers for family, friends, the hospital, and police and fire departments. General instructions  Ask your doctor for a referral to a local prenatal class. Begin classes no later than at the start of month 6 of your pregnancy.  Ask for help if you need counseling or if you need help with nutrition. Your doctor can give you advice or tell you where to go for help.  Do not use hot tubs, steam rooms, or saunas.  Do not douche or use tampons or scented sanitary pads.  Do not cross your legs for long periods of time.  Avoid all herbs and alcohol. Avoid drugs that are not approved by your doctor.  Do not use any tobacco products, including cigarettes, chewing tobacco, and electronic cigarettes. If you need help quitting, ask your doctor. You may get counseling or other support to help you quit.  Avoid cat litter boxes and soil used by cats. These carry germs that can  cause birth defects in the baby and can cause a loss of your baby (miscarriage) or stillbirth.  Visit your dentist. At home, brush your teeth with a soft toothbrush. Be gentle when you floss. Contact a doctor if:  You are dizzy.  You have mild cramps or pressure in your lower belly.  You have a nagging pain in your belly area.  You continue to feel sick to your stomach, you throw up, or you have watery poop (diarrhea).  You have a bad smelling fluid coming from your vagina.  You have pain when you pee (urinate).  You have increased puffiness (swelling) in your face, hands, legs, or ankles. Get help right away if:  You have a fever.  You are leaking fluid from your vagina.  You have spotting or bleeding from your vagina.  You have very bad belly cramping or pain.  You gain or lose weight rapidly.  You throw up blood. It may look like coffee grounds.  You are around people who have German measles, fifth disease,   or chickenpox.  You have a very bad headache.  You have shortness of breath.  You have any kind of trauma, such as from a fall or a car accident. Summary  The first trimester of pregnancy is from week 1 until the end of week 13 (months 1 through 3).  To take care of yourself and your unborn baby, you will need to eat healthy meals, take medicines only if your doctor tells you to do so, and do activities that are safe for you and your baby.  Keep all follow-up visits as told by your doctor. This is important as your doctor will have to ensure that your baby is healthy and growing well. This information is not intended to replace advice given to you by your health care provider. Make sure you discuss any questions you have with your health care provider. Document Released: 04/24/2008 Document Revised: 02/27/2019 Document Reviewed: 11/14/2016 Elsevier Patient Education  2020 [employer].

## 2019-07-23 NOTE — Progress Notes (Signed)
NOB   NOB intake completed on 07/08/19. GC/CT: 07/16/19 Negative  Last pap: 2019 per pt at [facility]   CC: pelvic pain pt states she recently had U/S confirmed fibroids.

## 2019-07-23 NOTE — Progress Notes (Signed)
History:   Danielle Short is a 33 y.o. [phone] at 20w6dby LMP being seen today for her first obstetrical visit.  Her obstetrical history is significant for chronic hypertension and previous csection. Patient does intend to breast feed. Pregnancy history fully reviewed.  Patient reports nausea.- has medication that is working      HISTORY: OB History  Gravida Para Term Preterm AB Living  _0 SAB TAB Ectopic Multiple Live Births  0 1 0 0 1    # Outcome Date GA Lbr Len/2nd Weight Sex Delivery Anes PTL Lv  4 Current           3 Term 03/25/11 322w0d F CS-LTranv   LIV     Birth Comments: System Generated. Please review and update pregnancy details.  2 TAB 2011          1 Preterm 06/10/09 [id number] CS-LTranv       Obstetric Comments  Son passed away 6-1Jun 25, 2011 Last pap smear was done 2019 and was [facility] at [facility] Medical History:  Diagnosis Date  . Hypertension during pregnancy   Past Surgical History:  Procedure Laterality Date  . CESAREAN SECTION     Family History  Problem Relation Age of Onset  . Diabetes Mother   . Hypertension Mother   . Kidney disease Mother   . Cancer Father    Social History   Tobacco Use  . Smoking status: Never Smoker  . Smokeless tobacco: Never Used  Substance Use Topics  . Alcohol use: No  . Drug use: No   No Known Allergies Current Outpatient Medications on File Prior to Visit  Medication Sig Dispense Refill  . Doxylamine-Pyridoxine (DICLEGIS) 10-10 MG TBEC Take 2 tablets by mouth at bedtime. If symptoms persist, add one tablet in the morning and one in the afternoon 100 tablet 5  . Prenatal MV-Min-FA-Omega-3 (PRENATAL GUMMIES/DHA & FA) 0.4-32.5 MG CHEW Chew 3 tablets by mouth daily. 90 tablet 12  . Blood Pressure Monitoring (BLOOD PRESSURE CUFF) MISC 1 Device by Does not apply route once a week. 1 each 0  . cephALEXin (KEFLEX) 500 MG capsule Take 1 capsule (500 mg total) by mouth 3 (three) times daily. 21  capsule 0  . Cetirizine HCl 10 MG CAPS Take 1 capsule (10 mg total) by mouth daily. (Patient not taking: Reported on 06/12/2016) 30 capsule 0  . fluticasone (FLONASE) 50 MCG/ACT nasal spray Place 1 spray into both nostrils daily. (Patient not taking: Reported on 06/12/2016) 9.9 g 0  . ibuprofen (ADVIL,MOTRIN) 200 MG tablet Take 400 mg by mouth every 6 (six) hours as needed. For pain    . metroNIDAZOLE (FLAGYL) 500 MG tablet Take 1 tablet (500 mg total) by mouth 2 (two) times daily. One po bid x 7 days (Patient not taking: Reported on 07/23/2019) 14 tablet 0  . naproxen (NAPROSYN) 500 MG tablet Take 1 tablet (500 mg total) by mouth 2 (two) times daily. 30 tablet 0   No current facility-administered medications on file prior to visit.     Review of Systems Pertinent items noted in HPI and remainder of comprehensive ROS otherwise negative. Physical Exam:   Vitals:   07/23/19 1311  BP: (!) 142/88  Pulse: (!) 101  Weight: 172 lb 3.2 oz (78.1 kg)   Fetal Heart Rate (bpm): US-165 Uterus:   enlarged   System: General: well-developed, well-nourished female in no acute  distress   Breasts:  normal appearance, no masses or tenderness bilaterally   Skin: normal coloration and turgor, no rashes   Neurologic: oriented, normal, negative, normal mood   Extremities: normal strength, tone, and muscle mass, ROM of all joints is normal   HEENT PERRLA, extraocular movement intact and sclera clear, anicteric   Mouth/Teeth mucous membranes moist, pharynx normal without lesions and dental hygiene good   Neck supple and no masses   Cardiovascular: regular rate and rhythm   Respiratory:  no respiratory distress, normal breath sounds   Abdomen: soft, non-tender; bowel sounds normal; abdomen distended   Bedside Ultrasound for FHR check: Patient informed that the ultrasound is considered a limited obstetric ultrasound and is not intended to be a complete ultrasound exam.  Patient also informed that the ultrasound is  not being completed with the intent of assessing for fetal or placental anomalies or any pelvic abnormalities.  Explained that the purpose of today's ultrasound is to assess for fetal heart rate.  Patient acknowledges the purpose of the exam and the limitations of the study.      FHR 165 by bedside US  Assessment:    Pregnancy: [id number] Patient Active Problem List   Diagnosis Date Noted  . Chronic hypertension affecting pregnancy 07/23/2019  . 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 Hx of cesarean section complicating pregnancy [id number]  . Supervision of high risk pregnancy, antepartum 07/08/2019     Plan:    1. Supervision of high risk pregnancy, antepartum - Welcomed to practice and introduced self to patient  - Notified patient that she is high risk based on history and recent diagnosis of fibroids - Anticipatory guidance on upcoming appointments  - Obstetric Panel, Including HIV - Culture, OB Urine - Genetic Screening - Babyscripts Schedule Optimization - Elastic Bandages & Supports (COMFORT FIT MATERNITY SUPP LG) MISC; 1 Units by Does not apply route daily.  Dispense: 1 each; Refill: 0  2. Chronic hypertension affecting pregnancy - Patient denies currently being on medication for HTN  - baseline labs obtained today  - Comp Met (CMET) - Protein / creatinine ratio, urine - aspirin EC 81 MG tablet; Take 1 tablet (81 mg total) by mouth daily. Take after 12 weeks for prevention of preeclampsia later in pregnancy  Dispense: 300 tablet; Refill: 0  3. Uterine leiomyoma, unspecified location - Patient was seen in the ED on 8/27 and diagnosed with multiple uterine fibroids with dominant fibroid measuring up to 9 cm   4. Hx of cesarean section complicating pregnancy - Hx of C/S x2  - plans for repeat    Initial labs drawn. Continue prenatal vitamins. Genetic Screening discussed, NIPS: ordered. Ultrasound discussed; fetal anatomic survey: ordered. Problem list reviewed and updated. The  nature of [place] with multiple MDs and other Advanced Practice Providers was explained to patient; also emphasized that residents, students are part of our team. Routine obstetric precautions reviewed. Return in about 4 weeks (around 08/20/2019) for HROB.     Lajean Manes, [place] for [employer], [place]

## 2019-07-24 DIAGNOSIS — D509 Iron deficiency anemia, unspecified: Secondary | ICD-10-CM | POA: Insufficient documentation

## 2019-07-24 LAB — COMPREHENSIVE METABOLIC PANEL
ALT: 9 IU/L (ref 0–32)
AST: 13 IU/L (ref 0–40)
Albumin/Globulin Ratio: 1.3 (ref 1.2–2.2)
Albumin: 4 g/dL (ref 3.9–5.0)
Alkaline Phosphatase: 53 IU/L (ref 39–117)
BUN/Creatinine Ratio: 15 (ref 9–23)
BUN: 9 mg/dL (ref 6–20)
Bilirubin Total: <0.2 mg/dL (ref 0.0–1.2)
CO2: 22 mmol/L (ref 20–29)
Calcium: 9.4 mg/dL (ref 8.7–10.2)
Chloride: 99 mmol/L (ref 96–106)
Creatinine, Ser: 0.59 mg/dL (ref 0.57–1.00)
GFR calc Af Amer: 142 mL/min/{1.73_m2} (ref >59)
GFR calc non Af Amer: 123 mL/min/{1.73_m2} (ref >59)
Globulin, Total: 3 g/dL (ref 1.5–4.5)
Glucose: 88 mg/dL (ref 65–99)
Potassium: 4 mmol/L (ref 3.5–5.2)
Sodium: 135 mmol/L (ref 134–144)
Total Protein: 7 g/dL (ref 6.0–8.5)

## 2019-07-24 LAB — OBSTETRIC PANEL, INCLUDING HIV
Antibody Screen: NEGATIVE
Basophils Absolute: 0 10*3/uL (ref 0.0–0.2)
Basos: 0 %
EOS (ABSOLUTE): 0.1 10*3/uL (ref 0.0–0.4)
Eos: 1 %
HIV Screen 4th Generation wRfx: NONREACTIVE
Hematocrit: 34.3 % (ref 34.0–46.6)
Hemoglobin: 10.2 g/dL — ABNORMAL LOW (ref 11.1–15.9)
Hepatitis B Surface Ag: NEGATIVE
Immature Grans (Abs): 0 10*3/uL (ref 0.0–0.1)
Immature Granulocytes: 0 %
Lymphocytes Absolute: 1.7 10*3/uL (ref 0.7–3.1)
Lymphs: 17 %
MCH: 22.2 pg — ABNORMAL LOW (ref 26.6–33.0)
MCHC: 29.7 g/dL — ABNORMAL LOW (ref 31.5–35.7)
MCV: 75 fL — ABNORMAL LOW (ref 79–97)
Monocytes Absolute: 0.6 10*3/uL (ref 0.1–0.9)
Monocytes: 6 %
Neutrophils Absolute: 7.4 10*3/uL — ABNORMAL HIGH (ref 1.4–7.0)
Neutrophils: 76 %
Platelets: 328 10*3/uL (ref 150–450)
RBC: 4.6 x10E6/uL (ref 3.77–5.28)
RDW: 18.8 % — ABNORMAL HIGH (ref 11.7–15.4)
RPR Ser Ql: NONREACTIVE
Rh Factor: POSITIVE
Rubella Antibodies, IGG: 10.3 index (ref >0.99)
WBC: 9.8 10*3/uL (ref 3.4–10.8)

## 2019-07-24 LAB — PROTEIN / CREATININE RATIO, URINE
Creatinine, Urine: 188.7 mg/dL
Protein, Ur: 30.1 mg/dL
Protein/Creat Ratio: 160 mg/g creat (ref 0–200)

## 2019-07-24 MED ORDER — FERROUS SULFATE 325 (65 FE) MG PO TABS: 325.0000 mg | ORAL_TABLET | Freq: Two times a day (BID) | ORAL | 1 refills | Status: DC

## 2019-07-24 NOTE — Addendum Note (Signed)
Addended by: Lajean Manes on: 07/24/2019 03:44 PM   Modules accepted: Orders

## 2019-07-25 LAB — URINE CULTURE, OB REFLEX

## 2019-07-25 LAB — CULTURE, OB URINE

## 2019-07-30 ENCOUNTER — Telehealth: Payer: Self-pay

## 2019-07-30 NOTE — Telephone Encounter (Signed)
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 Pt denies any symptoms at this time. I advised patient to monitor for any symptoms such as fever, sore throat, headache and if anything should arise to go to a testing site and be tested for the virus. Pt verbalizes understanding.

## 2019-08-04 ENCOUNTER — Encounter: Payer: Self-pay | Admitting: Certified Nurse Midwife

## 2019-08-20 ENCOUNTER — Ambulatory Visit (INDEPENDENT_AMBULATORY_CARE_PROVIDER_SITE_OTHER): Payer: Medicaid Other | Admitting: Obstetrics and Gynecology

## 2019-08-20 ENCOUNTER — Encounter: Payer: Self-pay | Admitting: Obstetrics and Gynecology

## 2019-08-20 ENCOUNTER — Other Ambulatory Visit: Payer: Self-pay

## 2019-08-20 VITALS — BP 133/86 | HR 85 | Wt 175.0 lb

## 2019-08-20 DIAGNOSIS — O10912 Unspecified pre-existing hypertension complicating pregnancy, second trimester: Secondary | ICD-10-CM

## 2019-08-20 DIAGNOSIS — O10919 Unspecified pre-existing hypertension complicating pregnancy, unspecified trimester: Secondary | ICD-10-CM

## 2019-08-20 DIAGNOSIS — O34219 Maternal care for unspecified type scar from previous cesarean delivery: Secondary | ICD-10-CM

## 2019-08-20 DIAGNOSIS — O0992 Supervision of high risk pregnancy, unspecified, second trimester: Secondary | ICD-10-CM

## 2019-08-20 DIAGNOSIS — O0990 Supervision of high risk pregnancy, unspecified, unspecified trimester: Secondary | ICD-10-CM

## 2019-08-20 DIAGNOSIS — O09299 Supervision of pregnancy with other poor reproductive or obstetric history, unspecified trimester: Secondary | ICD-10-CM | POA: Insufficient documentation

## 2019-08-20 DIAGNOSIS — Z23 Encounter for immunization: Secondary | ICD-10-CM

## 2019-08-20 DIAGNOSIS — O99012 Anemia complicating pregnancy, second trimester: Secondary | ICD-10-CM

## 2019-08-20 DIAGNOSIS — O09292 Supervision of pregnancy with other poor reproductive or obstetric history, second trimester: Secondary | ICD-10-CM

## 2019-08-20 DIAGNOSIS — D509 Iron deficiency anemia, unspecified: Secondary | ICD-10-CM

## 2019-08-20 DIAGNOSIS — Z3A14 14 weeks gestation of pregnancy: Secondary | ICD-10-CM

## 2019-08-20 NOTE — Progress Notes (Signed)
Pt needs Refill on BV Rx was not able to keep Rx down.

## 2019-08-20 NOTE — Progress Notes (Signed)
   PRENATAL VISIT NOTE  Subjective:  Danielle Short is a 33 y.o. [phone] at [redacted]w[redacted]d being seen today for ongoing prenatal care.  She is currently monitored for the following issues for this high-risk pregnancy and has Supervision of high risk pregnancy, antepartum; Chronic hypertension affecting pregnancy; Uterine myoma; Hx of cesarean section complicating pregnancy; Iron deficiency anemia during pregnancy; and H/O pre-eclampsia in prior pregnancy, currently pregnant on their problem list.  Patient reports pressure.  Contractions: Not present. Vag. Bleeding: None.  Movement: Present. Denies leaking of fluid.   The following portions of the patient's history were reviewed and updated as appropriate: allergies, current medications, past family history, past medical history, past social history, past surgical history and problem list.   Objective:   Vitals:   08/20/19 1044  BP: 133/86  Pulse: 85  Weight: 175 lb (79.4 kg)    Fetal Status: Fetal Heart Rate (bpm): 150   Movement: Present     General:  Alert, oriented and cooperative. Patient is in no acute distress.  Skin: Skin is warm and dry. No rash noted.   Cardiovascular: Normal heart rate noted  Respiratory: Normal respiratory effort, no problems with respiration noted  Abdomen: Soft, gravid, appropriate for gestational age.  Pain/Pressure: Present     Pelvic: Cervical exam deferred        Extremities: Normal range of motion.  Edema: None  Mental Status: Normal mood and affect. Normal behavior. Normal judgment and thought content.   Assessment and Plan:  Pregnancy: [id number] at [redacted]w[redacted]d  1. Supervision of high risk pregnancy, antepartum Thinks she had a [facility] last [facility]  2. Hx of cesarean section complicating pregnancy Will need RCS  3. Iron deficiency anemia during pregnancy Cont iron PO  4. Chronic hypertension affecting pregnancy Cont baby ASA BP stable no meds No meds prior to pregnancy  5. Flu vaccine need - Flu  Vaccine QUAD 36+ mos IM  6. H/O pre-eclampsia in prior pregnancy, currently pregnant H/o severe x2   Preterm labor symptoms and general obstetric precautions including but not limited to vaginal bleeding, contractions, leaking of fluid and fetal movement were reviewed in detail with the patient. Please refer to After Visit Summary for other counseling recommendations.   Return in about 4 weeks (around 09/17/2019) for high OB, virtual.  No future appointments.  Sloan Leiter, MD

## 2019-09-16 ENCOUNTER — Encounter (HOSPITAL_COMMUNITY): Payer: Self-pay

## 2019-09-17 ENCOUNTER — Telehealth (INDEPENDENT_AMBULATORY_CARE_PROVIDER_SITE_OTHER): Payer: Medicaid Other | Admitting: Obstetrics & Gynecology

## 2019-09-17 VITALS — BP 135/85

## 2019-09-17 DIAGNOSIS — O0992 Supervision of high risk pregnancy, unspecified, second trimester: Secondary | ICD-10-CM

## 2019-09-17 DIAGNOSIS — O10919 Unspecified pre-existing hypertension complicating pregnancy, unspecified trimester: Secondary | ICD-10-CM

## 2019-09-17 DIAGNOSIS — O0990 Supervision of high risk pregnancy, unspecified, unspecified trimester: Secondary | ICD-10-CM

## 2019-09-17 DIAGNOSIS — O99012 Anemia complicating pregnancy, second trimester: Secondary | ICD-10-CM

## 2019-09-17 DIAGNOSIS — O10912 Unspecified pre-existing hypertension complicating pregnancy, second trimester: Secondary | ICD-10-CM

## 2019-09-17 DIAGNOSIS — D509 Iron deficiency anemia, unspecified: Secondary | ICD-10-CM

## 2019-09-17 DIAGNOSIS — O34219 Maternal care for unspecified type scar from previous cesarean delivery: Secondary | ICD-10-CM

## 2019-09-17 DIAGNOSIS — O09292 Supervision of pregnancy with other poor reproductive or obstetric history, second trimester: Secondary | ICD-10-CM

## 2019-09-17 DIAGNOSIS — O09299 Supervision of pregnancy with other poor reproductive or obstetric history, unspecified trimester: Secondary | ICD-10-CM

## 2019-09-17 DIAGNOSIS — O99019 Anemia complicating pregnancy, unspecified trimester: Secondary | ICD-10-CM

## 2019-09-17 DIAGNOSIS — O34212 Maternal care for vertical scar from previous cesarean delivery: Secondary | ICD-10-CM

## 2019-09-17 DIAGNOSIS — Z3A18 18 weeks gestation of pregnancy: Secondary | ICD-10-CM

## 2019-09-17 NOTE — Progress Notes (Signed)
   [facility] VIRTUAL VIDEO VISIT ENCOUNTER NOTE  Provider location: [facility] [place] at [place]   I connected with [name] on 09/17/19 at 10:30 AM EDT by MyChart Video Encounter at home and verified that I am speaking with the correct person using two identifiers.   I discussed the limitations, risks, security and privacy concerns of performing an evaluation and management service virtually and the availability of in person appointments. I also discussed with the patient that there may be a patient responsible charge related to this service. The patient expressed understanding and agreed to proceed. Subjective:  Elleanna Arrison is a 33 y.o. [phone] at [redacted]w[redacted]d being seen today for ongoing prenatal care.  She is currently monitored for the following issues for this high-risk pregnancy and has Supervision of high risk pregnancy, antepartum; Chronic hypertension affecting pregnancy; Uterine myoma; Hx of cesarean section complicating pregnancy; Iron deficiency anemia during pregnancy; and H/O pre-eclampsia in prior pregnancy, currently pregnant on their problem list.  Patient reports lower abd pressure. .  Contractions: Not present. Vag. Bleeding: None.  Movement: Present. Denies any leaking of fluid.   The following portions of the patient's history were reviewed and updated as appropriate: allergies, current medications, past family history, past medical history, past social history, past surgical history and problem list.   Objective:   Vitals:   09/17/19 1039  BP: 135/85    Fetal Status:     Movement: Present     General:  Alert, oriented and cooperative. Patient is in no acute distress.  Respiratory: Normal respiratory effort, no problems with respiration noted  Mental Status: Normal mood and affect. Normal behavior. Normal judgment and thought content.  Rest of physical exam deferred due to type of encounter    Assessment and Plan:  Pregnancy: [id number]  at [redacted]w[redacted]d 1. Supervision of high risk pregnancy, antepartum Has [place] appt on Friday 10/30  2. Iron deficiency anemia during pregnancy Pt is taking iron plus PNV  3. Hx of cesarean section complicating pregnancy For repeat c/s at 41   4. H/O pre-eclampsia in prior pregnancy, currently pregnant Begin baby ASA   5. Chronic hypertension affecting pregnancy Last BP 135/85 2 days prev. .  Preterm labor symptoms and general obstetric precautions including but not limited to vaginal bleeding, contractions, leaking of fluid and fetal movement were reviewed in detail with the patient. I discussed the assessment and treatment plan with the patient. The patient was provided an opportunity to ask questions and all were answered. The patient agreed with the plan and demonstrated an understanding of the instructions. The patient was advised to call back or seek an in-person office evaluation/go to [facility]U at [facility] for any urgent or concerning symptoms. Please refer to After Visit Summary for other counseling recommendations.   I provided 12 minutes of face-to-face time during this encounter.  No follow-ups on file.  Future Appointments  Date Time Provider [employer]  09/19/2019 10:00 AM [place] [facility]  09/19/2019 10:00 AM [facility] [place] 3 [facility]    Davari Lopes Harraway-Smith, MD [facility], [place]

## 2019-09-17 NOTE — Progress Notes (Signed)
Pt is on the phone preparing for virtual visit with provider. [redacted]w[redacted]d. Anatomy scan scheduled for 09/19/19. Pt's BP is slightly elevated, pt denies any symptoms of HA or blurry vision.

## 2019-09-19 ENCOUNTER — Ambulatory Visit (HOSPITAL_COMMUNITY): Payer: Medicaid Other

## 2019-09-19 ENCOUNTER — Other Ambulatory Visit: Payer: Self-pay

## 2019-09-19 ENCOUNTER — Ambulatory Visit (HOSPITAL_COMMUNITY): Payer: Medicaid Other | Admitting: *Deleted

## 2019-09-19 ENCOUNTER — Encounter (HOSPITAL_COMMUNITY): Payer: Self-pay

## 2019-09-19 ENCOUNTER — Ambulatory Visit (HOSPITAL_COMMUNITY)
Admission: RE | Admit: 2019-09-19 | Discharge: 2019-09-19 | Disposition: A | Discharge status: Home / Self Care | Payer: Medicaid Other | Source: Ambulatory Visit | Attending: Obstetrics and Gynecology | Admitting: Obstetrics and Gynecology

## 2019-09-19 DIAGNOSIS — O10919 Unspecified pre-existing hypertension complicating pregnancy, unspecified trimester: Secondary | ICD-10-CM | POA: Insufficient documentation

## 2019-09-19 DIAGNOSIS — O0990 Supervision of high risk pregnancy, unspecified, unspecified trimester: Secondary | ICD-10-CM | POA: Insufficient documentation

## 2019-09-19 DIAGNOSIS — D259 Leiomyoma of uterus, unspecified: Secondary | ICD-10-CM | POA: Insufficient documentation

## 2019-09-19 DIAGNOSIS — O09292 Supervision of pregnancy with other poor reproductive or obstetric history, second trimester: Secondary | ICD-10-CM

## 2019-09-19 DIAGNOSIS — O34219 Maternal care for unspecified type scar from previous cesarean delivery: Secondary | ICD-10-CM

## 2019-09-19 DIAGNOSIS — O09212 Supervision of pregnancy with history of pre-term labor, second trimester: Secondary | ICD-10-CM | POA: Diagnosis not present

## 2019-09-19 DIAGNOSIS — O10012 Pre-existing essential hypertension complicating pregnancy, second trimester: Secondary | ICD-10-CM | POA: Diagnosis not present

## 2019-09-19 DIAGNOSIS — Z3A19 19 weeks gestation of pregnancy: Secondary | ICD-10-CM

## 2019-09-19 DIAGNOSIS — O3412 Maternal care for benign tumor of corpus uteri, second trimester: Secondary | ICD-10-CM

## 2019-09-22 ENCOUNTER — Other Ambulatory Visit (HOSPITAL_COMMUNITY): Payer: Self-pay | Admitting: *Deleted

## 2019-09-22 DIAGNOSIS — O10912 Unspecified pre-existing hypertension complicating pregnancy, second trimester: Secondary | ICD-10-CM

## 2019-09-25 ENCOUNTER — Other Ambulatory Visit: Payer: Self-pay

## 2019-09-25 ENCOUNTER — Telehealth: Payer: Self-pay

## 2019-09-25 ENCOUNTER — Inpatient Hospital Stay (HOSPITAL_COMMUNITY)
Admission: AD | Admit: 2019-09-25 | Discharge: 2019-09-25 | Disposition: A | Discharge status: Home / Self Care | Payer: Medicaid Other | Attending: Obstetrics & Gynecology | Admitting: Obstetrics & Gynecology

## 2019-09-25 ENCOUNTER — Encounter (HOSPITAL_COMMUNITY): Payer: Self-pay

## 2019-09-25 DIAGNOSIS — O99012 Anemia complicating pregnancy, second trimester: Secondary | ICD-10-CM | POA: Insufficient documentation

## 2019-09-25 DIAGNOSIS — Z79899 Other long term (current) drug therapy: Secondary | ICD-10-CM | POA: Diagnosis not present

## 2019-09-25 DIAGNOSIS — O99891 Other specified diseases and conditions complicating pregnancy: Secondary | ICD-10-CM | POA: Diagnosis not present

## 2019-09-25 DIAGNOSIS — O3412 Maternal care for benign tumor of corpus uteri, second trimester: Secondary | ICD-10-CM | POA: Insufficient documentation

## 2019-09-25 DIAGNOSIS — R102 Pelvic and perineal pain unspecified side: Secondary | ICD-10-CM

## 2019-09-25 DIAGNOSIS — O26892 Other specified pregnancy related conditions, second trimester: Secondary | ICD-10-CM

## 2019-09-25 DIAGNOSIS — Z87891 Personal history of nicotine dependence: Secondary | ICD-10-CM | POA: Insufficient documentation

## 2019-09-25 DIAGNOSIS — Z833 Family history of diabetes mellitus: Secondary | ICD-10-CM | POA: Diagnosis not present

## 2019-09-25 DIAGNOSIS — Z3A20 20 weeks gestation of pregnancy: Secondary | ICD-10-CM

## 2019-09-25 DIAGNOSIS — O162 Unspecified maternal hypertension, second trimester: Secondary | ICD-10-CM | POA: Diagnosis not present

## 2019-09-25 DIAGNOSIS — D259 Leiomyoma of uterus, unspecified: Secondary | ICD-10-CM | POA: Insufficient documentation

## 2019-09-25 DIAGNOSIS — D649 Anemia, unspecified: Secondary | ICD-10-CM | POA: Insufficient documentation

## 2019-09-25 DIAGNOSIS — Z809 Family history of malignant neoplasm, unspecified: Secondary | ICD-10-CM | POA: Insufficient documentation

## 2019-09-25 DIAGNOSIS — Z3492 Encounter for supervision of normal pregnancy, unspecified, second trimester: Secondary | ICD-10-CM

## 2019-09-25 DIAGNOSIS — O26899 Other specified pregnancy related conditions, unspecified trimester: Secondary | ICD-10-CM

## 2019-09-25 DIAGNOSIS — Z8249 Family history of ischemic heart disease and other diseases of the circulatory system: Secondary | ICD-10-CM | POA: Insufficient documentation

## 2019-09-25 DIAGNOSIS — D219 Benign neoplasm of connective and other soft tissue, unspecified: Secondary | ICD-10-CM

## 2019-09-25 LAB — URINALYSIS, ROUTINE W REFLEX MICROSCOPIC
Bilirubin Urine: NEGATIVE
Glucose, UA: NEGATIVE mg/dL
Hgb urine dipstick: NEGATIVE
Ketones, ur: NEGATIVE mg/dL
Leukocytes,Ua: NEGATIVE
Nitrite: NEGATIVE
Protein, ur: 30 mg/dL — AB
Specific Gravity, Urine: 1.027 (ref 1.005–1.030)
pH: 5 (ref 5.0–8.0)

## 2019-09-25 MED ORDER — CYCLOBENZAPRINE HCL 10 MG PO TABS: 10.0000 mg | ORAL_TABLET | Freq: Two times a day (BID) | ORAL | 0 refills | Status: DC | PRN

## 2019-09-25 NOTE — Progress Notes (Signed)
Main lab called. Danielle Short states she has just now received urine specimen.

## 2019-09-25 NOTE — Telephone Encounter (Signed)
-----   Message from Sloan Leiter, MD sent at 09/25/2019  8:51 AM EST ----- Regarding: RE: pain Contact: [phone] She should make an appointment to be seen sooner than her next visit, if cannot wait, needs to go to MAU. ----- Message ----- From: Tamela Oddi, RMA Sent: 09/24/2019   3:18 PM EST To: Sloan Leiter, MD Subject: pain                                           Hi Dr. Rosana Hoes, Patient is c/o severe and constant pain in area where her Fibroids are located, she is taking Tylenol but it is not helping.

## 2019-09-25 NOTE — Progress Notes (Signed)
Pt states she has been feeling intermittent lower abdm pain x 1 month as 5 on 0-10 pain scale (she does not currently feel this pain & at times this pain is a 10, typically when she wakes in the morning).  She also reports upper abdm/lower chest pain x "couple weeks ago" that she feels with increased activity or when she is laying down.  She does not currently feel this pain. Nausea noted with pregnancy that has been responsive to prescribed meds; denies vomiting or diarrhea.

## 2019-09-25 NOTE — Telephone Encounter (Signed)
  Patient advised to go to MAU. She reported that she was planning to do so anyways.

## 2019-09-25 NOTE — MAU Note (Signed)
.   Danielle Short is a 33 y.o. at [redacted]w[redacted]d here in MAU reporting: abdominal pain in her abdomen. Pt states that she has known fibroids and thinks that where the pain in coming from.   Onset of complaint: ongoing Pain score: 10 Vitals:   09/25/19 1101  BP: (!) 152/86  Pulse: 96  Resp: 16  Temp: 98.4 F (36.9 C)  SpO2: 100%     FHT:154 Lab orders placed from triage: UA

## 2019-09-25 NOTE — Discharge Instructions (Signed)

## 2019-09-25 NOTE — MAU Provider Note (Signed)
History     CSN: [id number]  Arrival date and time: 09/25/19 1051   First Provider Initiated Contact with Patient 09/25/19 1129      Chief Complaint  Patient presents with  . Abdominal Pain   HPI Danielle Short is a 33 y.o. [id number] at [redacted]w[redacted]d who presents to MAU with chief complaint of abdominal pain in the setting of known uterine fibroids. She endorses multiple recurrent episodes of bilateral pain along her round ligament. When the pain occurs it is 10/10 then resolves without intervention. She denies pain upon arrival to MAU.  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 She was previously advised to obtain a maternity belt but has not been able to do so due to cost and her work schedule. She frequently works overnight, is on her feet for the entire shift, and finds that her pain is the worst at the end of those shifts.   She denies vaginal bleeding, leaking of fluid, decreased fetal movement, fever, falls, or recent illness.   Patient's history is complicated by [facility]. She denies headache, blurry vision, RUQ/epigastric pain, new onset swelling, weight gain, dizziness or syncope.  Patient receives prenatal care at [facility]  4   Para  2   Term  1   Preterm  1   AB  1   Living  1     SAB      TAB  1   Ectopic      Multiple      Live Births  1        Obstetric Comments  Son passed away 05/20/2010        Past Medical History:  Diagnosis Date  . Anemia   . Hypertension during pregnancy  . Pregnancy induced hypertension   . Uterine fibroids affecting pregnancy     Past Surgical History:  Procedure Laterality Date  . CESAREAN SECTION      Family History  Problem Relation Age of Onset  . Diabetes Mother   . Hypertension Mother   . Kidney disease Mother   . Cancer Father     Social History   Tobacco Use  . Smoking status: Former Smoker     Types: Cigars    Quit date: 06/25/2019    Years since quitting: 0.2  . Smokeless tobacco: Never Used  Substance Use Topics  . Alcohol use: No  . Drug use: No    Allergies: No Known Allergies  Medications Prior to Admission  Medication Sig Dispense Refill Last Dose  . aspirin EC 81 MG tablet Take 1 tablet (81 mg total) by mouth daily. Take after 12 weeks for prevention of preeclampsia later in pregnancy (Patient not taking: Reported on 09/17/2019) 300 tablet 0   . Blood Pressure Monitoring (BLOOD PRESSURE CUFF) MISC 1 Device by Does not apply route once a week. 1 each 0   . cephALEXin (KEFLEX) 500 MG capsule Take 1 capsule (500 mg total) by mouth 3 (three) times daily. 21 capsule 0   . Cetirizine HCl 10 MG CAPS Take 1 capsule (10 mg total) by mouth daily. (Patient not taking: Reported on 06/12/2016) 30 capsule 0   . Doxylamine-Pyridoxine (DICLEGIS) 10-10 MG TBEC Take 2 tablets by mouth at bedtime. If symptoms persist, add one tablet in the morning and one in the afternoon 100 tablet 5   .  Elastic Bandages & Supports (COMFORT FIT MATERNITY SUPP LG) MISC 1 Units by Does not apply route daily. (Patient not taking: Reported on 08/20/2019) 1 each 0   . ferrous sulfate (FERROUSUL) 325 (65 FE) MG tablet Take 1 tablet (325 mg total) by mouth 2 (two) times daily. 60 tablet 1   . fluticasone (FLONASE) 50 MCG/ACT nasal spray Place 1 spray into both nostrils daily. (Patient not taking: Reported on 06/12/2016) 9.9 g 0   . ibuprofen (ADVIL,MOTRIN) 200 MG tablet Take 400 mg by mouth every 6 (six) hours as needed. For pain     . metroNIDAZOLE (FLAGYL) 500 MG tablet Take 1 tablet (500 mg total) by mouth 2 (two) times daily. One po bid x 7 days (Patient not taking: Reported on 09/17/2019) 14 tablet 0   . naproxen (NAPROSYN) 500 MG tablet Take 1 tablet (500 mg total) by mouth 2 (two) times daily. 30 tablet 0   . Prenatal MV-Min-FA-Omega-3 (PRENATAL GUMMIES/DHA & FA) 0.4-32.5 MG CHEW Chew 3 tablets by mouth daily.  90 tablet 12     Review of Systems  Constitutional: Negative for chills, fatigue and fever.  Eyes: Negative for photophobia.  Respiratory: Negative for chest tightness and shortness of breath.   Gastrointestinal: Positive for abdominal pain.  Genitourinary: Positive for pelvic pain. Negative for difficulty urinating, dysuria, vaginal bleeding, vaginal discharge and vaginal pain.  Musculoskeletal: Negative for back pain.  Neurological: Negative for syncope, weakness and headaches.  All other systems reviewed and are negative.  Physical Exam   Blood pressure 136/83, pulse 98, temperature 98.4 F (36.9 C), resp. rate 16, last menstrual period 05/08/2019, SpO2 100 %.  Physical Exam  Nursing note and vitals reviewed. Constitutional: She is oriented to person, place, and time. She appears well-developed and well-nourished.  Cardiovascular: Normal rate.  Respiratory: Effort normal and breath sounds normal.  GI: Soft. She exhibits no distension. There is no abdominal tenderness. There is no rebound, no guarding and no CVA tenderness.  Gravid  Neurological: She is alert and oriented to person, place, and time.  Skin: Skin is warm and dry.  Psychiatric: She has a normal mood and affect. Her behavior is normal. Judgment and thought content normal.    MAU Course/MDM  Procedures  --Patient very aware that her fibroids will likely become more uncomfortable as her baby increases in size. We also discussed that her ability to tolerate this discomfort is compromised by what sounds like musculoskeletal pain and normal round ligament pain of pregnancy.  Patient Vitals for the past 24 hrs:  BP Temp Temp src Pulse Resp SpO2  09/25/19 1218 (!) 140/92 98.7 F (37.1 C) Oral 92 12 -  09/25/19 1127 136/83 - - 98 - -  09/25/19 1101 (!) 152/86 98.4 F (36.9 C) - 96 16 100 %   Results for orders placed or performed during the hospital encounter of 09/25/19 (from the past 24 hour(s))  Urinalysis,  Routine w reflex microscopic     Status: Abnormal   Collection Time: 09/25/19 11:26 AM  Result Value Ref Range   Color, Urine YELLOW YELLOW   APPearance HAZY (A) CLEAR   Specific Gravity, Urine 1.027 1.005 - 1.030   pH 5.0 5.0 - 8.0   Glucose, UA NEGATIVE NEGATIVE mg/dL   Hgb urine dipstick NEGATIVE NEGATIVE   Bilirubin Urine NEGATIVE NEGATIVE   Ketones, ur NEGATIVE NEGATIVE mg/dL   Protein, ur 30 (A) NEGATIVE mg/dL   Nitrite NEGATIVE NEGATIVE   Leukocytes,Ua NEGATIVE NEGATIVE  RBC / HPF 0-5 0 - 5 RBC/hpf   WBC, UA 0-5 0 - 5 WBC/hpf   Bacteria, UA FEW (A) NONE SEEN   Squamous Epithelial / LPF 11-20 0 - 5   Mucus PRESENT    Meds ordered this encounter  Medications  . cyclobenzaprine (FLEXERIL) 10 MG tablet    Sig: Take 1 tablet (10 mg total) by mouth 2 (two) times daily as needed for muscle spasms.    Dispense:  20 tablet    Refill:  0    Order Specific Question:   Supervising Provider    Answer:   [place] Mode [id number]   Assessment and Plan  --33 y.o. [phone] at [redacted]w[redacted]d  --[place] 154 by Doppler --Chronic Hypertension, no severe range pressures or symptoms --Fibroid pain intensified by fetal movement and round ligament pain --Encouraged to pick up maternity belt, short course Flexeril --Discharge home in stable condition  F/U: -[place] -Next appt 10/15/19  Darlina Rumpf, [place] 09/25/2019, 3:32 PM

## 2019-10-15 ENCOUNTER — Telehealth (INDEPENDENT_AMBULATORY_CARE_PROVIDER_SITE_OTHER): Payer: Medicaid Other | Admitting: Obstetrics & Gynecology

## 2019-10-15 VITALS — BP 137/95

## 2019-10-15 DIAGNOSIS — O99019 Anemia complicating pregnancy, unspecified trimester: Secondary | ICD-10-CM

## 2019-10-15 DIAGNOSIS — O10912 Unspecified pre-existing hypertension complicating pregnancy, second trimester: Secondary | ICD-10-CM

## 2019-10-15 DIAGNOSIS — O0990 Supervision of high risk pregnancy, unspecified, unspecified trimester: Secondary | ICD-10-CM

## 2019-10-15 DIAGNOSIS — O34219 Maternal care for unspecified type scar from previous cesarean delivery: Secondary | ICD-10-CM

## 2019-10-15 DIAGNOSIS — O26892 Other specified pregnancy related conditions, second trimester: Secondary | ICD-10-CM

## 2019-10-15 DIAGNOSIS — D250 Submucous leiomyoma of uterus: Secondary | ICD-10-CM

## 2019-10-15 DIAGNOSIS — O09299 Supervision of pregnancy with other poor reproductive or obstetric history, unspecified trimester: Secondary | ICD-10-CM

## 2019-10-15 DIAGNOSIS — Z3A22 22 weeks gestation of pregnancy: Secondary | ICD-10-CM

## 2019-10-15 DIAGNOSIS — D509 Iron deficiency anemia, unspecified: Secondary | ICD-10-CM

## 2019-10-15 DIAGNOSIS — O99012 Anemia complicating pregnancy, second trimester: Secondary | ICD-10-CM

## 2019-10-15 DIAGNOSIS — O0992 Supervision of high risk pregnancy, unspecified, second trimester: Secondary | ICD-10-CM

## 2019-10-15 DIAGNOSIS — O10919 Unspecified pre-existing hypertension complicating pregnancy, unspecified trimester: Secondary | ICD-10-CM

## 2019-10-15 DIAGNOSIS — O09292 Supervision of pregnancy with other poor reproductive or obstetric history, second trimester: Secondary | ICD-10-CM

## 2019-10-15 MED ORDER — LABETALOL HCL 200 MG PO TABS: 200.0000 mg | ORAL_TABLET | Freq: Two times a day (BID) | ORAL | 3 refills | Status: DC

## 2019-10-15 NOTE — Progress Notes (Signed)
Pt is on the phone preparing for virtual visit with provider. [redacted]w[redacted]d. Pt reports she has been having mild HA's that go away with tylenol and sleep. Pt had anatomy scan [place] on 09/19/2019. Pt has FU ultrasound on 10/21/19 for more views due to fibroids.

## 2019-10-15 NOTE — Progress Notes (Signed)
TELEHEALTH OBSTETRICS PRENATAL VIRTUAL VIDEO VISIT ENCOUNTER NOTE  Provider location: [facility] at [place]   I connected with Danielle Short on 10/15/19 at 10:00 AM EST by MyChart Video Encounter at home and verified that I am speaking with the correct person using two identifiers.   I discussed the limitations, risks, security and privacy concerns of performing an evaluation and management service virtually and the availability of in person appointments. I also discussed with the patient that there may be a patient responsible charge related to this service. The patient expressed understanding and agreed to proceed. Subjective:  [name] is a 33 y.o. [phone] at [redacted]w[redacted]d being seen today for ongoing prenatal care.  She is currently monitored for the following issues for this high-risk pregnancy and has Supervision of high risk pregnancy, antepartum; Chronic hypertension affecting pregnancy; Uterine myoma; Hx of cesarean section complicating pregnancy; Iron deficiency anemia during pregnancy; and H/O pre-eclampsia in prior pregnancy, currently pregnant on their problem list.  Patient reports mild rare HA. Controlled with Tylenol. .  Contractions: Not present. Vag. Bleeding: None.  Movement: Present. Denies any leaking of fluid.   The following portions of the patient's history were reviewed and updated as appropriate: allergies, current medications, past family history, past medical history, past social history, past surgical history and problem list.   Objective:   Vitals:   10/15/19 1001  BP: (!) 137/95    Fetal Status:     Movement: Present     General:  Alert, oriented and cooperative. Patient is in no acute distress.  Respiratory: Normal respiratory effort, no problems with respiration noted  Mental Status: Normal mood and affect. Normal behavior. Normal judgment and thought content.  Rest of physical exam deferred due to type of encounter  Imaging: [place] Mfm Ob  Detail +14 Wk  Result Date: 09/19/2019 ----------------------------------------------------------------------  OBSTETRICS REPORT                       (Signed Final 09/19/2019 04:35 pm) ---------------------------------------------------------------------- Patient Info  ID #:       [id number]                          D.O.B.:  1986-06-26 (33 yrs)  Name:       Danielle Short                  Visit Date: 09/19/2019 03:37 pm ---------------------------------------------------------------------- Performed By  Performed By:     Rodrigo Ran BS      Ref. Address:     [address]  [place]                                                             [place]  Attending:        Tama High MD        [facility]  Referred By:      [facility] ---------------------------------------------------------------------- Orders   #  Description                          Code         Ordered By   1  [place] MFM OB DETAIL +14 WK              76811.01     Steele DAVIS  ----------------------------------------------------------------------   #  Order #                    Accession #                 Episode #   1  [id number]                  [id number]                  [id number]  ---------------------------------------------------------------------- Indications   Antenatal screening for malformations          Z36.3   Hypertension - Chronic/Pre-existing            O10.019   Uterine fibroids affecting pregnancy in        O34.12, D25.9   second trimester, antepartum   Poor obstetric history: Previous preterm       O09.219   delivery, antepartum (27 weeks)   Previous cesarean delivery, antepartum x 2     O34.219   Poor obstetric  history: Previous               O09.299   preeclampsia / eclampsia/gestational HTN   [redacted] weeks gestation of pregnancy                Z3A.19  ---------------------------------------------------------------------- Fetal Evaluation  Num Of Fetuses:         1  Fetal Heart Rate(bpm):  153  Cardiac Activity:       Observed  Presentation:           Cephalic  Placenta:               Anterior  P. Cord Insertion:      Visualized  Amniotic Fluid  AFI FV:      Within normal limits                              Largest Pocket(cm)  4.21 ---------------------------------------------------------------------- Biometry  BPD:      42.4  mm     G. Age:  18w 6d         37  %    CI:        68.04   %    70 - 86                                                          FL/HC:      19.2   %    16.1 - 18.3  HC:      164.4  mm     G. Age:  19w 1d         44  %    HC/AC:      1.11        1.09 - 1.39  AC:      147.9  mm     G. Age:  20w 0d         76  %    FL/BPD:     74.3   %  FL:       31.5  mm     G. Age:  19w 6d         67  %    FL/AC:      21.3   %    20 - 24  HUM:      31.1  mm     G. Age:  20w 2d         82  %  CER:      19.7  mm     G. Age:  18w 6d         43  %  NFT:       4.8  mm  CM:        4.4  mm  Est. FW:     315  gm    0 lb 11 oz      83  % ---------------------------------------------------------------------- OB History  Gravidity:    4         Term:   1        Prem:   1        SAB:   0  TOP:          1       Ectopic:  0        Living: 1 ---------------------------------------------------------------------- Gestational Age  LMP:           19w 1d        Date:  05/08/19                 EDD:   02/12/20  U/S Today:     19w 3d                                        EDD:   02/10/20  Best:          19w 1d     Det. By:  LMP  (05/08/19)          EDD:   02/12/20 ---------------------------------------------------------------------- Anatomy  Cranium:  Appears normal         LVOT:                    Appears normal  Cavum:                 Appears normal         Aortic Arch:            Not well visualized  Ventricles:            Appears normal         Ductal Arch:            Not well visualized  Choroid Plexus:        Appears normal         Diaphragm:              Not well visualized  Cerebellum:            Appears normal         Stomach:                Appears normal, left                                                                        sided  Posterior Fossa:       Appears normal         Abdomen:                Appears normal  Nuchal Fold:           Appears normal         Abdominal Wall:         Appears nml (cord                                                                        insert, abd wall)  Face:                  Appears normal         Cord Vessels:           Appears normal (3                         (orbits and profile)                           vessel cord)  Lips:                  Appears normal         Kidneys:                Appear normal  Palate:                Appears normal         Bladder:  Appears normal  Thoracic:              Appears normal         Spine:                  Appears normal  Heart:                 Appears normal         Upper Extremities:      Appears normal                         (4CH, axis, and                         situs)  RVOT:                  Appears normal         Lower Extremities:      Appears normal  Other:  Nasal bone visualized. Heels/feet and open hands/5th digits          visualized. ---------------------------------------------------------------------- Cervix Uterus Adnexa  Cervix  Length:            4.2  cm.  Normal appearance by transabdominal scan.  Uterus  Multiple fibroids noted, see table below.  Left Ovary  Within normal limits.  Right Ovary  Within normal limits.  Cul De Sac  No free fluid seen.  Adnexa  No abnormality visualized. ---------------------------------------------------------------------- Myomas   Site                      L(cm)      W(cm)      D(cm)      Location   Anterior Left            5.2        5          5    Anterior                 6.3        5.6        4.4   Right                    8          7.6        6.9  ----------------------------------------------------------------------   Blood Flow                 RI        PI       Comments  ---------------------------------------------------------------------- Impression  Ms. Aquia, [place] [id number] at 19-weeks' gestation is here for fetal  anatomy scan.  Obstetric history is significant for a preterm  cesarean delivery at [redacted] weeks gestation in 2010 of a female  infant.  Her pregnancy was complicated by preeclampsia.  Unfortunately her son died at 56 months of age because of  prematurity complications.  In 2012, she had a term repeat  cesarean delivery of a female infant and her daughter is in  good health.  She has a diagnosis of chronic hypertension  and takes low-dose aspirin prophylaxis.  On cell-free fetal DNA screening, the risks of fetal  aneuploidies are not increased.  We performed fetal anatomy scan. No makers of  aneuploidies or fetal structural defects are seen. Fetal  biometry is consistent with her previously-established dates.  Amniotic fluid is normal  and good fetal activity is seen.  Placenta is anterior and there is no evidence of previa or  accreta.  Patient understands the limitations of ultrasound in  detecting fetal anomalies.  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  Patient  does not have any symptoms pertaining to the myomas.  Blood pressure at our office is 140/93 mmHg. ---------------------------------------------------------------------- Recommendations  -An appointment was made for her to return in 4 weeks for  completion of fetal anatomy.  -Fetal growth assessment every 4 weeks. ----------------------------------------------------------------------                  Tama High, MD Electronically Signed Final Report   09/19/2019 04:35 pm  ----------------------------------------------------------------------   Assessment and Plan:  Pregnancy: [id number] at [redacted]w[redacted]d 1. Supervision of high risk pregnancy, antepartum Good FM No problems  2. Iron deficiency anemia during pregnancy Pt is taking FeSO4  3. Hx of cesarean section complicating pregnancy For repeat at 39 weeks   4. H/O pre-eclampsia in prior pregnancy, currently pregnant On baby ASA daily  5. Chronic hypertension affecting pregnancy L 138/95 R 164/95 (this is the highest it has been)  Pt in car. About to head into interview.  Will begin Labetalol 200mg  bid No meds currently.   6. Submucous leiomyoma of uterus Got pregnancy belt and it helped.   Preterm labor symptoms and general obstetric precautions including but not limited to vaginal bleeding, contractions, leaking of fluid and fetal movement were reviewed in detail with the patient. I discussed the assessment and treatment plan with the patient. The patient was provided an opportunity to ask questions and all were answered. The patient agreed with the plan and demonstrated an understanding of the instructions. The patient was advised to call back or seek an in-person office evaluation/go to MAU at [facility] for any urgent or concerning symptoms. Please refer to After Visit Summary for other counseling recommendations.   I provided 12 minutes of face-to-face time during this encounter.  No follow-ups on file.  Future Appointments  Date Time Provider [place]  10/21/2019  9:40 AM [place] MFC-US  10/21/2019  9:45 AM [place] 2 WH-MFCUS MFC-US    Lavonia Drafts, MD Center for [facility], [place]

## 2019-10-19 ENCOUNTER — Other Ambulatory Visit: Payer: Self-pay | Admitting: Obstetrics and Gynecology

## 2019-10-19 ENCOUNTER — Telehealth: Payer: Self-pay | Admitting: Obstetrics and Gynecology

## 2019-10-19 DIAGNOSIS — O0990 Supervision of high risk pregnancy, unspecified, unspecified trimester: Secondary | ICD-10-CM

## 2019-10-19 MED ORDER — ONDANSETRON 4 MG PO TBDP: 4.0000 mg | ORAL_TABLET | Freq: Four times a day (QID) | ORAL | 1 refills | Status: DC | PRN

## 2019-10-19 NOTE — Telephone Encounter (Signed)
Error

## 2019-10-19 NOTE — Progress Notes (Signed)
Pt called with c/o N/V unable to obtain Rx Diclegis d/t with PA. Rx for Zofran sent to pharmacy.

## 2019-10-20 ENCOUNTER — Telehealth: Payer: Self-pay

## 2019-10-20 NOTE — Telephone Encounter (Signed)
Pharmacist stated that pt picked up RX on 10/19/19

## 2019-10-21 ENCOUNTER — Ambulatory Visit (HOSPITAL_COMMUNITY): Payer: Medicaid Other

## 2019-11-05 ENCOUNTER — Telehealth: Payer: Self-pay | Admitting: *Deleted

## 2019-11-05 NOTE — Telephone Encounter (Signed)
Pt called to office asking what she may take for sore throat that will not effect her BP.  Spoke with pt. She states she is having some mild congestion and sore throat. Pt states she thinks it is due to the recent weather change.  Pt advised she may try Tylenol, Cepacol, Robitussin as needed for symptoms.  Pt also advised she should look up [place] test and try to be tested this week due to current symptoms. Pt advised she should be mindful of her surroundings and practice safety standards.   Pt advised to call office with any other questions/concerns.

## 2019-11-12 ENCOUNTER — Telehealth (INDEPENDENT_AMBULATORY_CARE_PROVIDER_SITE_OTHER): Payer: Medicaid Other | Admitting: Obstetrics

## 2019-11-12 ENCOUNTER — Encounter: Payer: Self-pay | Admitting: Obstetrics

## 2019-11-12 ENCOUNTER — Other Ambulatory Visit: Payer: Self-pay

## 2019-11-12 VITALS — BP 133/94 | HR 84

## 2019-11-12 DIAGNOSIS — O10919 Unspecified pre-existing hypertension complicating pregnancy, unspecified trimester: Secondary | ICD-10-CM

## 2019-11-12 DIAGNOSIS — O34219 Maternal care for unspecified type scar from previous cesarean delivery: Secondary | ICD-10-CM

## 2019-11-12 DIAGNOSIS — O0990 Supervision of high risk pregnancy, unspecified, unspecified trimester: Secondary | ICD-10-CM

## 2019-11-12 DIAGNOSIS — O09299 Supervision of pregnancy with other poor reproductive or obstetric history, unspecified trimester: Secondary | ICD-10-CM

## 2019-11-12 DIAGNOSIS — O10912 Unspecified pre-existing hypertension complicating pregnancy, second trimester: Secondary | ICD-10-CM

## 2019-11-12 DIAGNOSIS — O0992 Supervision of high risk pregnancy, unspecified, second trimester: Secondary | ICD-10-CM

## 2019-11-12 DIAGNOSIS — O09292 Supervision of pregnancy with other poor reproductive or obstetric history, second trimester: Secondary | ICD-10-CM

## 2019-11-12 DIAGNOSIS — Z3A26 26 weeks gestation of pregnancy: Secondary | ICD-10-CM

## 2019-11-12 NOTE — Progress Notes (Signed)
I connected with  Danielle Short on 11/12/19 by a video enabled telemedicine application and verified that I am speaking with the correct person using two identifiers.   I discussed the limitations of evaluation and management by telemedicine. The patient expressed understanding and agreed to proceed.  Mychart ROB.  C/o swelling in feet x 1 day, headaches 2/10 x 3 weeks.

## 2019-11-12 NOTE — Progress Notes (Signed)
   TELEHEALTH OBSTETRICS PRENATAL VIRTUAL VIDEO VISIT ENCOUNTER NOTE  Provider location: [facility] at [place]   I connected with Danielle Short on 11/12/19 at  1:00 PM EST by [facility] MyChart Video Encounter at home and verified that I am speaking with the correct person using two identifiers.   I discussed the limitations, risks, security and privacy concerns of performing an evaluation and management service virtually and the availability of in person appointments. I also discussed with the patient that there may be a patient responsible charge related to this service. The patient expressed understanding and agreed to proceed. Subjective:  Danielle Short is a 33 y.o. [phone] at [redacted]w[redacted]d being seen today for ongoing prenatal care.  She is currently monitored for the following issues for this high-risk pregnancy and has Supervision of high risk pregnancy, antepartum; Chronic hypertension affecting pregnancy; Uterine myoma; Hx of cesarean section complicating pregnancy; Iron deficiency anemia during pregnancy; and H/O pre-eclampsia in prior pregnancy, currently pregnant on their problem list.  Patient reports no complaints.  Contractions: Irritability. Vag. Bleeding: None.  Movement: Present. Denies any leaking of fluid.   The following portions of the patient's history were reviewed and updated as appropriate: allergies, current medications, past family history, past medical history, past social history, past surgical history and problem list.   Objective:   Vitals:   11/12/19 1305 11/12/19 1309  BP: (!) 140/94 (!) 133/94  Pulse: 98 84    Fetal Status:     Movement: Present     General:  Alert, oriented and cooperative. Patient is in no acute distress.  Respiratory: Normal respiratory effort, no problems with respiration noted  Mental Status: Normal mood and affect. Normal behavior. Normal judgment and thought content.  Rest of physical exam deferred due to type of encounter   Imaging: No results found.  Assessment and Plan:  Pregnancy: [id number] at [redacted]w[redacted]d  1. Supervision of high risk pregnancy, antepartum  2. Hx of cesarean section complicating pregnancy  3. H/O pre-eclampsia in prior pregnancy, currently pregnant - taking Baby ASA  4. Chronic hypertension affecting pregnancy - BP stable on Labetalol    There are no diagnoses linked to this encounter. Preterm labor symptoms and general obstetric precautions including but not limited to vaginal bleeding, contractions, leaking of fluid and fetal movement were reviewed in detail with the patient. I discussed the assessment and treatment plan with the patient. The patient was provided an opportunity to ask questions and all were answered. The patient agreed with the plan and demonstrated an understanding of the instructions. The patient was advised to call back or seek an in-person office evaluation/go to MAU at [facility] for any urgent or concerning symptoms. Please refer to After Visit Summary for other counseling recommendations.   I provided 10 minutes of face-to-face time during this encounter.  Return in about 2 weeks (around 11/26/2019) for [facility], 2 hour OGTT.  Future Appointments  Date Time Provider [place]  11/24/2019  7:45 AM [place] 4 WH-MFCUS MFC-US  11/24/2019  7:50 AM [place] MFC-US    Baltazar Najjar, [place] for [facility] 11/12/2019

## 2019-11-21 ENCOUNTER — Other Ambulatory Visit: Payer: Self-pay

## 2019-11-21 ENCOUNTER — Inpatient Hospital Stay (HOSPITAL_COMMUNITY)
Admission: AD | Admit: 2019-11-21 | Discharge: 2019-11-21 | Disposition: A | Discharge status: Home / Self Care | Payer: Medicaid Other | Attending: Obstetrics and Gynecology | Admitting: Obstetrics and Gynecology

## 2019-11-21 DIAGNOSIS — Z841 Family history of disorders of kidney and ureter: Secondary | ICD-10-CM | POA: Insufficient documentation

## 2019-11-21 DIAGNOSIS — O113 Pre-existing hypertension with pre-eclampsia, third trimester: Secondary | ICD-10-CM | POA: Diagnosis not present

## 2019-11-21 DIAGNOSIS — O10913 Unspecified pre-existing hypertension complicating pregnancy, third trimester: Secondary | ICD-10-CM | POA: Insufficient documentation

## 2019-11-21 DIAGNOSIS — Z6790 Unspecified blood type, Rh positive: Secondary | ICD-10-CM | POA: Diagnosis not present

## 2019-11-21 DIAGNOSIS — Z7982 Long term (current) use of aspirin: Secondary | ICD-10-CM | POA: Insufficient documentation

## 2019-11-21 DIAGNOSIS — D649 Anemia, unspecified: Secondary | ICD-10-CM | POA: Insufficient documentation

## 2019-11-21 DIAGNOSIS — O4693 Antepartum hemorrhage, unspecified, third trimester: Secondary | ICD-10-CM | POA: Diagnosis not present

## 2019-11-21 DIAGNOSIS — Z833 Family history of diabetes mellitus: Secondary | ICD-10-CM | POA: Diagnosis not present

## 2019-11-21 DIAGNOSIS — Z87891 Personal history of nicotine dependence: Secondary | ICD-10-CM | POA: Insufficient documentation

## 2019-11-21 DIAGNOSIS — O4690 Antepartum hemorrhage, unspecified, unspecified trimester: Secondary | ICD-10-CM

## 2019-11-21 DIAGNOSIS — Z8249 Family history of ischemic heart disease and other diseases of the circulatory system: Secondary | ICD-10-CM | POA: Insufficient documentation

## 2019-11-21 DIAGNOSIS — O119 Pre-existing hypertension with pre-eclampsia, unspecified trimester: Secondary | ICD-10-CM

## 2019-11-21 DIAGNOSIS — O99013 Anemia complicating pregnancy, third trimester: Secondary | ICD-10-CM

## 2019-11-21 DIAGNOSIS — Z3A28 28 weeks gestation of pregnancy: Secondary | ICD-10-CM | POA: Diagnosis not present

## 2019-11-21 DIAGNOSIS — Z79899 Other long term (current) drug therapy: Secondary | ICD-10-CM | POA: Insufficient documentation

## 2019-11-21 DIAGNOSIS — N939 Abnormal uterine and vaginal bleeding, unspecified: Secondary | ICD-10-CM | POA: Diagnosis present

## 2019-11-21 DIAGNOSIS — Z3689 Encounter for other specified antenatal screening: Secondary | ICD-10-CM

## 2019-11-21 LAB — COMPREHENSIVE METABOLIC PANEL
ALT: 22 U/L (ref 0–44)
AST: 23 U/L (ref 15–41)
Albumin: 2.8 g/dL — ABNORMAL LOW (ref 3.5–5.0)
Alkaline Phosphatase: 71 U/L (ref 38–126)
Anion gap: 7 (ref 5–15)
BUN: 8 mg/dL (ref 6–20)
CO2: 22 mmol/L (ref 22–32)
Calcium: 9.3 mg/dL (ref 8.9–10.3)
Chloride: 105 mmol/L (ref 98–111)
Creatinine, Ser: 0.62 mg/dL (ref 0.44–1.00)
GFR calc Af Amer: >60 mL/min (ref >60)
GFR calc non Af Amer: >60 mL/min (ref >60)
Glucose, Bld: 90 mg/dL (ref 70–99)
Potassium: 4.1 mmol/L (ref 3.5–5.1)
Sodium: 134 mmol/L — ABNORMAL LOW (ref 135–145)
Total Bilirubin: 0.4 mg/dL (ref 0.3–1.2)
Total Protein: 7.1 g/dL (ref 6.5–8.1)

## 2019-11-21 LAB — URINALYSIS, ROUTINE W REFLEX MICROSCOPIC
Bilirubin Urine: NEGATIVE
Glucose, UA: NEGATIVE mg/dL
Ketones, ur: NEGATIVE mg/dL
Leukocytes,Ua: NEGATIVE
Nitrite: NEGATIVE
Protein, ur: 100 mg/dL — AB
Specific Gravity, Urine: 1.021 (ref 1.005–1.030)
pH: 6 (ref 5.0–8.0)

## 2019-11-21 LAB — PROTEIN / CREATININE RATIO, URINE
Creatinine, Urine: 197.98 mg/dL
Protein Creatinine Ratio: 0.42 mg/mg{creat} — ABNORMAL HIGH (ref 0.00–0.15)
Total Protein, Urine: 84 mg/dL

## 2019-11-21 LAB — CBC
HCT: 31.5 % — ABNORMAL LOW (ref 36.0–46.0)
Hemoglobin: 9.4 g/dL — ABNORMAL LOW (ref 12.0–15.0)
MCH: 21.6 pg — ABNORMAL LOW (ref 26.0–34.0)
MCHC: 29.8 g/dL — ABNORMAL LOW (ref 30.0–36.0)
MCV: 72.2 fL — ABNORMAL LOW (ref 80.0–100.0)
Platelets: 316 K/uL (ref 150–400)
RBC: 4.36 MIL/uL (ref 3.87–5.11)
RDW: 20.3 % — ABNORMAL HIGH (ref 11.5–15.5)
WBC: 7.8 K/uL (ref 4.0–10.5)
nRBC: 0 % (ref 0.0–0.2)

## 2019-11-21 LAB — WET PREP, GENITAL
Clue Cells Wet Prep HPF POC: NONE SEEN
Sperm: NONE SEEN
Trich, Wet Prep: NONE SEEN
Yeast Wet Prep HPF POC: NONE SEEN

## 2019-11-21 NOTE — Discharge Instructions (Signed)
Vaginal Bleeding During Pregnancy, Third Trimester  A small amount of bleeding from the vagina (spotting) is relatively common during pregnancy. Various things can cause bleeding or spotting during pregnancy. Sometimes bleeding is normal and is not a problem. However, bleeding during the third trimester can also be a sign of something serious for the mother and the baby. Be sure to tell your health care provider about any vaginal bleeding right away. Some possible causes of vaginal bleeding during the third trimester include:  Infection or growths (polyps) on the cervix.  A condition in which the placenta partially or completely covers the opening of the cervix inside the uterus (placenta previa).  The placenta separating from the uterus (placenta abruption).  The start of labor (discharging of the mucus plug).  A condition in which the placenta grows into the muscle layer of the uterus (placenta accreta). Follow these instructions at home: Activity  Follow instructions from your health care provider about limiting your activity. If your health care provider recommends activity restriction, you may need to stay in bed and only get up to use the bathroom. In some cases, your health care provider may allow you to continue light activity.  If needed, make plans for someone to help with your regular activities.  Ask your health care provider if it is safe for you to drive.  Do not lift anything that is heavier than 10 lb (4.5 kg), or the limit that your health care provider tells you, until he or she says that it is safe.  Do not have sex or orgasms until your health care provider says that this is safe. Medicines  Take over-the-counter and prescription medicines only as told by your health care provider.  Do not take aspirin because it can cause bleeding. General instructions  Pay attention to any changes in your symptoms.  Write down how many pads you use each day, how often you  change pads, and how soaked (saturated) they are.  Do not use tampons or douche.  If you pass any tissue from your vagina, save the tissue so you can show it to your health care provider.  Keep all follow-up visits as told by your health care provider. This is important. Contact a health care provider if:  You have vaginal bleeding during any part of your pregnancy.  You have cramps or labor pains.  You have a fever. Get help right away if:  You have severe cramps or pain in your back or abdomen.  You have a gush of fluid from the vagina.  You pass large clots or a large amount of tissue from your vagina.  Your bleeding increases.  You feel light-headed or weak.  You faint.  You feel that your baby is moving less than usual, or not moving at all. Summary  Various things can cause bleeding or spotting in pregnancy.  Bleeding during the third trimester can be a sign of a serious problem for the mother and the baby.  Be sure to tell your health care provider about any vaginal bleeding right away. This information is not intended to replace advice given to you by your health care provider. Make sure you discuss any questions you have with your health care provider. Document Revised: 02/25/2019 Document Reviewed: 02/08/2017 Elsevier Patient Education  [place]. Placental Abruption  The placenta is the organ formed during pregnancy. It carries oxygen and nutrients to the unborn baby (fetus). The placenta is the baby's life support system. In normal  circumstances, it remains attached to the inside of the uterus until the baby is born. Placental abruption is a condition in which the placenta partly or completely separates from the uterus before the baby is born. Placental abruption is rare, but it can happen any time after 20 weeks of pregnancy. A small separation may not cause problems, but a large separation may be dangerous for you and your baby. A large separation is  usually an emergency that requires treatment right away. What are the causes? In most cases, the cause of this condition is not known. What increases the risk? This condition is more likely to develop in women who:  Have experienced a recent trauma such as a fall, an injury to the abdomen, or a car accident.  Have had a previous placental abruption.  Have high blood pressure.  Smoke cigarettes, use alcohol, or use drugs, such as cocaine.  Have multiples (twins, triplets, or more).  Have too much amniotic fluid (polyhydramnios).  Are 16 years of age or older. What are the signs or symptoms? Symptoms of this condition can be mild or severe. A small placental abruption may not cause symptoms, or it may cause mild symptoms, which may include:  Mild pain in the abdomen or lower back.  Slight bleeding in the vagina. A severe placental abruption will cause symptoms. The symptoms will depend on the size of the separation and the stage of pregnancy. They may include:  Severe pain in the abdomen or lower back.  Bleeding from the vagina.  Ongoing contractions of your uterus with little or no relaxation of your uterus between contractions. How is this diagnosed? There are no tests to diagnose placental abruption. However, your health care provider may suspect that you have this condition based on:  Your symptoms.  A physical exam.  Ultrasound findings.  Blood tests. How is this treated? Treatment for placental abruption depends on the severity of the condition.  Mild cases may be monitored through close observation. You may be admitted to the hospital during this time.  Severe cases may require emergency treatment. This may involve: ? Admission to the hospital. ? Emergency cesarean delivery of your baby. ? A blood transfusion or other fluids given through an IV. Follow these instructions at home: Activity  Get plenty of rest and sleep.  Do not have sex until your health  care provider says it is okay. Lifestyle  Do not use drugs.  Do not drink alcohol.  Do not use tampons or douche unless your health care provider says it is okay.  Do not use any products that contain nicotine or tobacco, such as cigarettes, e-cigarettes, and chewing tobacco. If you need help quitting, ask your health care provider. General instructions  Take over-the-counter and prescription medicines only as told by your health care provider.  Do not take any medicines that your health care provider has not approved.  Arrange for help at home so you can get adequate rest.  Keep all follow-up visits as told by your health care provider. This is important. Contact a health care provider if:  You have vaginal spotting.  You are having mild, regular contractions. Get help right away if:  You have moderate or heavy vaginal bleeding or spotting.  You have severe pain in the abdomen.  You have continuous uterine contractions.  You have a hard, tender uterus.  You have any type of trauma, such as an injury to the abdomen, a fall, or a car accident.  You  do not feel the baby move, or the baby moves very little. Summary  Placental abruption is a condition in which the placenta partly or completely separates from the uterus before the baby is born.  Placental abruption is a medical emergency that requires treatment right away.  Contact a health care provider if you have vaginal bleeding, or if you have mild, regular contractions.  Get help right away if you have heavy vaginal bleeding, severe pain in the abdomen, continuous uterine contractions, or you do not feel the baby move.  Keep all follow-up visits as told by your health care provider. This is important. This information is not intended to replace advice given to you by your health care provider. Make sure you discuss any questions you have with your health care provider. Document Revised: 05/01/2019 Document Reviewed:  05/01/2019 Elsevier Patient Education  [place]. Preeclampsia and Eclampsia Preeclampsia is a serious condition that may develop during pregnancy. This condition causes high blood pressure and increased protein in your urine along with other symptoms, such as headaches and vision changes. These symptoms may develop as the condition gets worse. Preeclampsia may occur at 20 weeks of pregnancy or later. Diagnosing and treating preeclampsia early is very important. If not treated early, it can cause serious problems for you and your baby. One problem it can lead to is eclampsia. Eclampsia is a condition that causes muscle jerking or shaking (convulsions or seizures) and other serious problems for the mother. During pregnancy, delivering your baby may be the best treatment for preeclampsia or eclampsia. For most women, preeclampsia and eclampsia symptoms go away after giving birth. In rare cases, a woman may develop preeclampsia after giving birth (postpartum preeclampsia). This usually occurs within 48 hours after childbirth but may occur up to 6 weeks after giving birth. What are the causes? The cause of preeclampsia is not known. What increases the risk? The following risk factors make you more likely to develop preeclampsia:  Being pregnant for the first time.  Having had preeclampsia during a past pregnancy.  Having a family history of preeclampsia.  Having high blood pressure.  Being pregnant with more than one baby.  Being 29 or older.  Being African-American.  Having kidney disease or diabetes.  Having medical conditions such as lupus or blood diseases.  Being very overweight (obese). What are the signs or symptoms? The most common symptoms are:  Severe headaches.  Vision problems, such as blurred or double vision.  Abdominal pain, especially upper abdominal pain. Other symptoms that may develop as the condition gets worse include:  Sudden weight gain.  Sudden  swelling of the hands, face, legs, and feet.  Severe nausea and vomiting.  Numbness in the face, arms, legs, and feet.  Dizziness.  Urinating less than usual.  Slurred speech.  Convulsions or seizures. How is this diagnosed? There are no screening tests for preeclampsia. Your health care provider will ask you about symptoms and check for signs of preeclampsia during your prenatal visits. You may also have tests that include:  Checking your blood pressure.  Urine tests to check for protein. Your health care provider will check for this at every prenatal visit.  Blood tests.  Monitoring your baby's heart rate.  Ultrasound. How is this treated? You and your health care provider will determine the treatment approach that is best for you. Treatment may include:  Having more frequent prenatal exams to check for signs of preeclampsia, if you have an increased risk for preeclampsia.  Medicine to lower your blood pressure.  Staying in the hospital, if your condition is severe. There, treatment will focus on controlling your blood pressure and the amount of fluids in your body (fluid retention).  Taking medicine (magnesium sulfate) to prevent seizures. This may be given as an injection or through an IV.  Taking a low-dose aspirin during your pregnancy.  Delivering your baby early. You may have your labor started with medicine (induced), or you may have a cesarean delivery. Follow these instructions at home: Eating and drinking   Drink enough fluid to keep your urine pale yellow.  Avoid caffeine. Lifestyle  Do not use any products that contain nicotine or tobacco, such as cigarettes and e-cigarettes. If you need help quitting, ask your health care provider.  Do not use alcohol or drugs.  Avoid stress as much as possible. Rest and get plenty of sleep. General instructions  Take over-the-counter and prescription medicines only as told by your health care provider.  When  lying down, lie on your left side. This keeps pressure off your major blood vessels.  When sitting or lying down, raise (elevate) your feet. Try putting some pillows underneath your lower legs.  Exercise regularly. Ask your health care provider what kinds of exercise are best for you.  Keep all follow-up and prenatal visits as told by your health care provider. This is important. How is this prevented? There is no known way of preventing preeclampsia or eclampsia from developing. However, to lower your risk of complications and detect problems early:  Get regular prenatal care. Your health care provider may be able to diagnose and treat the condition early.  Maintain a healthy weight. Ask your health care provider for help managing weight gain during pregnancy.  Work with your health care provider to manage any long-term (chronic) health conditions you have, such as diabetes or kidney problems.  You may have tests of your blood pressure and kidney function after giving birth.  Your health care provider may have you take low-dose aspirin during your next pregnancy. Contact a health care provider if:  You have symptoms that your health care provider told you may require more treatment or monitoring, such as: ? Headaches. ? Nausea or vomiting. ? Abdominal pain. ? Dizziness. ? Light-headedness. Get help right away if:  You have severe: ? Abdominal pain. ? Headaches that do not get better. ? Dizziness. ? Vision problems. ? Confusion. ? Nausea or vomiting.  You have any of the following: ? A seizure. ? Sudden, rapid weight gain. ? Sudden swelling in your hands, ankles, or face. ? Trouble moving any part of your body. ? Numbness in any part of your body. ? Trouble speaking. ? Abnormal bleeding.  You faint. Summary  Preeclampsia is a serious condition that may develop during pregnancy.  This condition causes high blood pressure and increased protein in your urine along with  other symptoms, such as headaches and vision changes.  Diagnosing and treating preeclampsia early is very important. If not treated early, it can cause serious problems for you and your baby.  Get help right away if you have symptoms that your health care provider told you to watch for. This information is not intended to replace advice given to you by your health care provider. Make sure you discuss any questions you have with your health care provider. Document Revised: 07/09/2018 Document Reviewed: 06/12/2016 Elsevier Patient Education  [place].

## 2019-11-21 NOTE — MAU Provider Note (Signed)
History     CSN: [id number]  Arrival date and time: 11/21/19 1206   First Provider Initiated Contact with Patient 11/21/19 1232      Chief Complaint  Patient presents with  . Vaginal Bleeding   Ms. Danielle Short is a 34 y.o. [phone] at [redacted]w[redacted]d who presents to MAU for vaginal bleeding which began around 1030AM today. Pt reports she saw the bleeding when she went to use the bathroom and reports the bleeding was a "very light, brownish-pinkish color." Pt reports some continued "residue" after using restroom in MAU, but feels like the bleeding is resolving. Pt reports intercourse last night.  Of note, patient's BP is elevated in MAU, but pt reports she has not yet taken her BP medication this morning. Pt has hx of cHTN. Pt denies all s/sx of preeclampsia. Pt reports she has her BP medication with it and will take it while in MAU.  Passing blood clots? no Blood soaking clothes? no Lightheaded/dizzy? no Significant pelvic pain or cramping/ctx? no Passed any tissue? no Recent trauma? no Prior abruption? no Smoking/drug use? no HTN? Yes, cHTN and hx of preeclampsia PPROM? no Current pregnancy problems? CHTN, hx of C/S x2, fibroids, anemia Hx of C/S or GYN surgery? Hx C/S x2  Blood Type? A Positive Allergies? NKDA Current medications? Labetalol 200mg  BID, baby ASA, Diclegis, iron Current [facility]NC & next appt? [facility], 11/25/2018   OB History    Gravida  4   Para  2   Term  1   Preterm  1   AB  1   Living  1     SAB      TAB  1   Ectopic      Multiple      Live Births  1        Obstetric Comments  Son passed away May 17, 2010        Past Medical History:  Diagnosis Date  . Anemia   . Hypertension during pregnancy  . Pregnancy induced hypertension   . Uterine fibroids affecting pregnancy     Past Surgical History:  Procedure Laterality Date  . CESAREAN SECTION      Family History  Problem Relation Age of Onset  . Diabetes Mother   . Hypertension Mother   .  Kidney disease Mother   . Cancer Father     Social History   Tobacco Use  . Smoking status: Former Smoker    Types: Cigars    Quit date: 06/25/2019    Years since quitting: 0.4  . Smokeless tobacco: Never Used  Substance Use Topics  . Alcohol use: No  . Drug use: No    Allergies: No Known Allergies  Medications Prior to Admission  Medication Sig Dispense Refill Last Dose  . aspirin EC 81 MG tablet Take 1 tablet (81 mg total) by mouth daily. Take after 12 weeks for prevention of preeclampsia later in pregnancy 300 tablet 0   . Blood Pressure Monitoring (BLOOD PRESSURE CUFF) MISC 1 Device by Does not apply route once a week. 1 each 0   . cyclobenzaprine (FLEXERIL) 10 MG tablet Take 1 tablet (10 mg total) by mouth 2 (two) times daily as needed for muscle spasms. 20 tablet 0   . Doxylamine-Pyridoxine (DICLEGIS) 10-10 MG TBEC Take 2 tablets by mouth at bedtime. If symptoms persist, add one tablet in the morning and one in the afternoon 100 tablet 5   . Elastic Bandages & Supports (COMFORT FIT MATERNITY SUPP LG) MISC  1 Units by Does not apply route daily. 1 each 0   . ferrous sulfate (FERROUSUL) 325 (65 FE) MG tablet Take 1 tablet (325 mg total) by mouth 2 (two) times daily. 60 tablet 1   . labetalol (NORMODYNE) 200 MG tablet Take 1 tablet (200 mg total) by mouth 2 (two) times daily. 60 tablet 3   . ondansetron (ZOFRAN ODT) 4 MG disintegrating tablet Take 1 tablet (4 mg total) by mouth every 6 (six) hours as needed for nausea. 20 tablet 1   . Prenatal MV-Min-FA-Omega-3 (PRENATAL GUMMIES/DHA & FA) 0.4-32.5 MG CHEW Chew 3 tablets by mouth daily. 90 tablet 12     Review of Systems  Constitutional: Negative for chills, diaphoresis, fatigue and fever.  Eyes: Negative for visual disturbance.  Respiratory: Negative for shortness of breath.   Cardiovascular: Negative for chest pain.  Gastrointestinal: Negative for abdominal pain, constipation, diarrhea, nausea and vomiting.  Genitourinary:  Positive for vaginal bleeding. Negative for dysuria, flank pain, frequency, pelvic pain, urgency and vaginal discharge.  Neurological: Negative for dizziness, weakness, light-headedness and headaches.   Physical Exam   Blood pressure 135/89, pulse 83, temperature 98.5 F (36.9 C), resp. rate 18, height 5\' 5"  (1.651 m), weight 87.5 kg, last menstrual period 05/08/2019, SpO2 100 %.  Patient Vitals for the past 24 hrs:  BP Temp Pulse Resp SpO2 Height Weight  11/21/19 1445 135/89 - 83 18 - - -  11/21/19 1430 125/76 - 91 - - - -  11/21/19 1415 131/85 - 88 - - - -  11/21/19 1400 (!) 138/97 - 85 - - - -  11/21/19 1345 130/78 - 94 - - - -  11/21/19 1330 (!) 135/91 - 94 - - - -  11/21/19 1315 (!) 134/97 - 94 - - - -  11/21/19 1300 (!) 123/97 - 94 - - - -  11/21/19 1245 (!) 134/91 - 77 - - - -  11/21/19 1228 (!) 155/96 98.5 F (36.9 C) 87 18 100 % 5\' 5"  (1.651 m) 87.5 kg   Physical Exam  Constitutional: She is oriented to person, place, and time. She appears well-developed and well-nourished. No distress.  HENT:  Head: Normocephalic and atraumatic.  Respiratory: Effort normal.  GI: Soft. She exhibits no distension and no mass. There is no abdominal tenderness. There is no rebound and no guarding.  Genitourinary: There is no rash, tenderness or lesion on the right labia. There is no rash, tenderness or lesion on the left labia. Cervix exhibits no motion tenderness, no discharge and no friability.    No vaginal discharge, tenderness or bleeding.  No tenderness or bleeding in the vagina.    Genitourinary Comments: Dilation: Closed Effacement (%): Thick Exam by:: Nugent NP  Minimal blood/mucus coming from external os, no active bleeding noted, blood lceared with single Fox swab.   Neurological: She is alert and oriented to person, place, and time.  Skin: Skin is warm and dry. She is not diaphoretic.  Psychiatric: She has a normal mood and affect. Her behavior is normal. Judgment and thought  content normal.   Results for orders placed or performed during the hospital encounter of 11/21/19 (from the past 24 hour(s))  Wet prep, genital     Status: Abnormal   Collection Time: 11/21/19 12:47 PM   Specimen: PATH Cytology Cervicovaginal Ancillary Only  Result Value Ref Range   Yeast Wet Prep HPF POC NONE SEEN NONE SEEN   Trich, Wet Prep NONE SEEN NONE SEEN   Clue  Cells Wet Prep HPF POC NONE SEEN NONE SEEN   WBC, Wet Prep HPF POC MODERATE (A) NONE SEEN   Sperm NONE SEEN   Urinalysis, Routine w reflex microscopic     Status: Abnormal   Collection Time: 11/21/19 12:52 PM  Result Value Ref Range   Color, Urine YELLOW YELLOW   APPearance HAZY (A) CLEAR   Specific Gravity, Urine 1.021 1.005 - 1.030   pH 6.0 5.0 - 8.0   Glucose, UA NEGATIVE NEGATIVE mg/dL   Hgb urine dipstick LARGE (A) NEGATIVE   Bilirubin Urine NEGATIVE NEGATIVE   Ketones, ur NEGATIVE NEGATIVE mg/dL   Protein, ur 100 (A) NEGATIVE mg/dL   Nitrite NEGATIVE NEGATIVE   Leukocytes,Ua NEGATIVE NEGATIVE   RBC / HPF 0-5 0 - 5 RBC/hpf   WBC, UA 0-5 0 - 5 WBC/hpf   Bacteria, UA RARE (A) NONE SEEN   Squamous Epithelial / LPF 0-5 0 - 5   Mucus PRESENT   Protein / creatinine ratio, urine     Status: Abnormal   Collection Time: 11/21/19 12:52 PM  Result Value Ref Range   Creatinine, Urine 197.98 mg/dL   Total Protein, Urine 84 mg/dL   Protein Creatinine Ratio 0.42 (H) 0.00 - 0.15 mg/mg[Cre]  CBC     Status: Abnormal   Collection Time: 11/21/19  1:22 PM  Result Value Ref Range   WBC 7.8 4.0 - 10.5 K/uL   RBC 4.36 3.87 - 5.11 MIL/uL   Hemoglobin 9.4 (L) 12.0 - 15.0 g/dL   HCT 31.5 (L) 36.0 - 46.0 %   MCV 72.2 (L) 80.0 - 100.0 fL   MCH 21.6 (L) 26.0 - 34.0 pg   MCHC 29.8 (L) 30.0 - 36.0 g/dL   RDW 20.3 (H) 11.5 - 15.5 %   Platelets 316 150 - 400 K/uL   nRBC 0.0 0.0 - 0.2 %  Comprehensive metabolic panel     Status: Abnormal   Collection Time: 11/21/19  1:22 PM  Result Value Ref Range   Sodium 134 (L) 135 - 145  mmol/L   Potassium 4.1 3.5 - 5.1 mmol/L   Chloride 105 98 - 111 mmol/L   CO2 22 22 - 32 mmol/L   Glucose, Bld 90 70 - 99 mg/dL   BUN 8 6 - 20 mg/dL   Creatinine, Ser 0.62 0.44 - 1.00 mg/dL   Calcium 9.3 8.9 - 10.3 mg/dL   Total Protein 7.1 6.5 - 8.1 g/dL   Albumin 2.8 (L) 3.5 - 5.0 g/dL   AST 23 15 - 41 U/L   ALT 22 0 - 44 U/L   Alkaline Phosphatase 71 38 - 126 U/L   Total Bilirubin 0.4 0.3 - 1.2 mg/dL   GFR calc non Af Amer >60 >60 mL/min   GFR calc Af Amer >60 >60 mL/min   Anion gap 7 5 - 15    MAU Course  Procedures  MDM -VB, RH positive, intercourse last night -anterior placenta -on exam no active bleeding noted, minimal bleeding from external os -WetPrep: WNL -GC/CT collected -Dilation: Closed Effacement (%): Thick Exam by:: Nugent NP  -preeclampsia work-up without symptoms -pt taking BP meds in MAU -UA: hazy/lg hgb/100PRO -CBC: H/H 9.4/31.5 (pt currently on iron), platelets 316 -CMP: WNL, AST/ALT 23/22 (were 13/9 at baseline) -PCr: 0.42 -BP trending down after medication  EFM: reactive with variables       -baseline: 140       -variability: moderate       -accels: present, 10x10       -  decels: present, variable @1320        -TOCO: irritability  -consulted with Dr. Rosana Hoes, pt OK to be discharged home with strict precautions -pt discharged to home in stable condition  Orders Placed This Encounter  Procedures  . Wet prep, genital    Standing Status:   Standing    Number of Occurrences:   1  . Urinalysis, Routine w reflex microscopic    Standing Status:   Standing    Number of Occurrences:   1  . CBC    Standing Status:   Standing    Number of Occurrences:   1  . Comprehensive metabolic panel    Standing Status:   Standing    Number of Occurrences:   1  . Protein / creatinine ratio, urine    Standing Status:   Standing    Number of Occurrences:   1  . Discharge patient    Order Specific Question:   Discharge disposition    Answer:   01-Home or Self  Care [1]    Order Specific Question:   Discharge patient date    Answer:   11/21/2019   No orders of the defined types were placed in this encounter.  Assessment and Plan   1. Chronic hypertension with superimposed pre-eclampsia   2. [redacted] weeks gestation of pregnancy   3. NST (non-stress test) reactive   4. Vaginal bleeding in pregnancy   5. Blood type, Rh positive   6. Anemia during pregnancy in third trimester    Allergies as of 11/21/2019   No Known Allergies     Medication List    TAKE these medications   aspirin EC 81 MG tablet Take 1 tablet (81 mg total) by mouth daily. Take after 12 weeks for prevention of preeclampsia later in pregnancy   Blood Pressure Cuff Misc 1 Device by Does not apply route once a week.   Comfort Fit Maternity Supp Lg Misc 1 Units by Does not apply route daily.   cyclobenzaprine 10 MG tablet Commonly known as: FLEXERIL Take 1 tablet (10 mg total) by mouth 2 (two) times daily as needed for muscle spasms.   Doxylamine-Pyridoxine 10-10 MG Tbec Commonly known as: Diclegis Take 2 tablets by mouth at bedtime. If symptoms persist, add one tablet in the morning and one in the afternoon   ferrous sulfate 325 (65 FE) MG tablet Commonly known as: FerrouSul Take 1 tablet (325 mg total) by mouth 2 (two) times daily.   labetalol 200 MG tablet Commonly known as: NORMODYNE Take 1 tablet (200 mg total) by mouth 2 (two) times daily.   ondansetron 4 MG disintegrating tablet Commonly known as: Zofran ODT Take 1 tablet (4 mg total) by mouth every 6 (six) hours as needed for nausea.   Prenatal Gummies/DHA & FA 0.4-32.5 MG Chew Chew 3 tablets by mouth daily.      -will call with culture results, if positive -pt alerted to new diagnosis of super-imposed PEC -pt strongly advised to take BP meds, iron as prescribed -pt strongly advised to keep all OB appointments, especially one coming up next week for BP check and symptom check -strict  preeclampsia/bleeding/DFM/return MAU precautions given -pt discharged to home in stable condition  Elmyra Ricks E Nugent 11/21/2019, 2:56 PM

## 2019-11-21 NOTE — MAU Note (Signed)
.  Danielle Short is a 33 y.o. at [redacted]w[redacted]d here in MAU reporting: vaginal spotting when she woke up this morning. Intercourse last night. Denies any pain  Onset of complaint: this morning Pain score: 0 There were no vitals filed for this visit.   FHT:145 Lab orders placed from triage:  UA

## 2019-11-24 ENCOUNTER — Other Ambulatory Visit (HOSPITAL_COMMUNITY): Payer: Self-pay | Admitting: *Deleted

## 2019-11-24 ENCOUNTER — Ambulatory Visit (HOSPITAL_COMMUNITY): Payer: Medicaid Other | Admitting: *Deleted

## 2019-11-24 ENCOUNTER — Encounter (HOSPITAL_COMMUNITY): Payer: Self-pay | Admitting: *Deleted

## 2019-11-24 ENCOUNTER — Ambulatory Visit (HOSPITAL_COMMUNITY): Payer: Medicaid Other

## 2019-11-24 ENCOUNTER — Other Ambulatory Visit: Payer: Self-pay

## 2019-11-24 ENCOUNTER — Ambulatory Visit (HOSPITAL_COMMUNITY)
Admission: RE | Admit: 2019-11-24 | Discharge: 2019-11-24 | Disposition: A | Discharge status: Home / Self Care | Payer: Medicaid Other | Source: Ambulatory Visit | Attending: Obstetrics and Gynecology | Admitting: Obstetrics and Gynecology

## 2019-11-24 DIAGNOSIS — O09293 Supervision of pregnancy with other poor reproductive or obstetric history, third trimester: Secondary | ICD-10-CM

## 2019-11-24 DIAGNOSIS — O10912 Unspecified pre-existing hypertension complicating pregnancy, second trimester: Secondary | ICD-10-CM | POA: Diagnosis present

## 2019-11-24 DIAGNOSIS — O3413 Maternal care for benign tumor of corpus uteri, third trimester: Secondary | ICD-10-CM

## 2019-11-24 DIAGNOSIS — O09213 Supervision of pregnancy with history of pre-term labor, third trimester: Secondary | ICD-10-CM

## 2019-11-24 DIAGNOSIS — O10919 Unspecified pre-existing hypertension complicating pregnancy, unspecified trimester: Secondary | ICD-10-CM | POA: Insufficient documentation

## 2019-11-24 DIAGNOSIS — Z362 Encounter for other antenatal screening follow-up: Secondary | ICD-10-CM

## 2019-11-24 DIAGNOSIS — D259 Leiomyoma of uterus, unspecified: Secondary | ICD-10-CM

## 2019-11-24 DIAGNOSIS — O0990 Supervision of high risk pregnancy, unspecified, unspecified trimester: Secondary | ICD-10-CM | POA: Diagnosis present

## 2019-11-24 DIAGNOSIS — Z3A28 28 weeks gestation of pregnancy: Secondary | ICD-10-CM

## 2019-11-24 DIAGNOSIS — O34219 Maternal care for unspecified type scar from previous cesarean delivery: Secondary | ICD-10-CM

## 2019-11-24 DIAGNOSIS — O10013 Pre-existing essential hypertension complicating pregnancy, third trimester: Secondary | ICD-10-CM

## 2019-11-24 LAB — GC/CHLAMYDIA PROBE AMP (~~LOC~~) NOT AT ARMC
Chlamydia: NEGATIVE
Comment: NEGATIVE
Comment: NORMAL
Neisseria Gonorrhea: NEGATIVE

## 2019-11-26 ENCOUNTER — Ambulatory Visit (INDEPENDENT_AMBULATORY_CARE_PROVIDER_SITE_OTHER): Payer: Medicaid Other | Admitting: Obstetrics and Gynecology

## 2019-11-26 ENCOUNTER — Encounter: Payer: Self-pay | Admitting: Obstetrics and Gynecology

## 2019-11-26 ENCOUNTER — Other Ambulatory Visit: Payer: Medicaid Other

## 2019-11-26 ENCOUNTER — Other Ambulatory Visit: Payer: Self-pay

## 2019-11-26 VITALS — BP 148/87 | HR 92 | Wt 194.0 lb

## 2019-11-26 DIAGNOSIS — D509 Iron deficiency anemia, unspecified: Secondary | ICD-10-CM

## 2019-11-26 DIAGNOSIS — Z3A28 28 weeks gestation of pregnancy: Secondary | ICD-10-CM

## 2019-11-26 DIAGNOSIS — O0993 Supervision of high risk pregnancy, unspecified, third trimester: Secondary | ICD-10-CM

## 2019-11-26 DIAGNOSIS — O99019 Anemia complicating pregnancy, unspecified trimester: Secondary | ICD-10-CM

## 2019-11-26 DIAGNOSIS — Z9851 Tubal ligation status: Secondary | ICD-10-CM | POA: Insufficient documentation

## 2019-11-26 DIAGNOSIS — O09299 Supervision of pregnancy with other poor reproductive or obstetric history, unspecified trimester: Secondary | ICD-10-CM

## 2019-11-26 DIAGNOSIS — O34219 Maternal care for unspecified type scar from previous cesarean delivery: Secondary | ICD-10-CM

## 2019-11-26 DIAGNOSIS — O10913 Unspecified pre-existing hypertension complicating pregnancy, third trimester: Secondary | ICD-10-CM

## 2019-11-26 DIAGNOSIS — O09293 Supervision of pregnancy with other poor reproductive or obstetric history, third trimester: Secondary | ICD-10-CM

## 2019-11-26 DIAGNOSIS — Z23 Encounter for immunization: Secondary | ICD-10-CM

## 2019-11-26 DIAGNOSIS — O0990 Supervision of high risk pregnancy, unspecified, unspecified trimester: Secondary | ICD-10-CM

## 2019-11-26 DIAGNOSIS — Z3009 Encounter for other general counseling and advice on contraception: Secondary | ICD-10-CM

## 2019-11-26 DIAGNOSIS — O10919 Unspecified pre-existing hypertension complicating pregnancy, unspecified trimester: Secondary | ICD-10-CM

## 2019-11-26 DIAGNOSIS — D259 Leiomyoma of uterus, unspecified: Secondary | ICD-10-CM

## 2019-11-26 NOTE — Progress Notes (Signed)
ROB?GTT.   TDAP given in LD, tolerated well.  Reports no problems today.

## 2019-11-26 NOTE — Progress Notes (Signed)
Subjective:  Danielle Short is a 34 y.o. [phone] at [redacted]w[redacted]d being seen today for ongoing prenatal care.  She is currently monitored for the following issues for this high-risk pregnancy and has Supervision of high risk pregnancy, antepartum; Chronic hypertension affecting pregnancy; Uterine myoma; Hx of cesarean section complicating pregnancy; Iron deficiency anemia during pregnancy; H/O pre-eclampsia in prior pregnancy, currently pregnant; and Unwanted fertility on their problem list.  Patient reports no complaints.  Contractions: Not present. Vag. Bleeding: None.  Movement: Present. Denies leaking of fluid.   The following portions of the patient's history were reviewed and updated as appropriate: allergies, current medications, past family history, past medical history, past social history, past surgical history and problem list. Problem list updated.  Objective:   Vitals:   11/26/19 0814  BP: (!) 148/87  Pulse: 92  Weight: 194 lb (88 kg)    Fetal Status: Fetal Heart Rate (bpm): 149   Movement: Present     General:  Alert, oriented and cooperative. Patient is in no acute distress.  Skin: Skin is warm and dry. No rash noted.   Cardiovascular: Normal heart rate noted  Respiratory: Normal respiratory effort, no problems with respiration noted  Abdomen: Soft, gravid, appropriate for gestational age. Pain/Pressure: Present     Pelvic:  Cervical exam deferred        Extremities: Normal range of motion.  Edema: None  Mental Status: Normal mood and affect. Normal behavior. Normal judgment and thought content.   Urinalysis:      Assessment and Plan:  Pregnancy: [id number] at [redacted]w[redacted]d  1. Supervision of high risk pregnancy, antepartum Stable 28 week labs today TD vaccine today  2. Chronic hypertension affecting pregnancy BP stable on current treatment, will continue Growth 14 % on 11/24/19 scan F/U scan in 4 weeks Start antenatal testing at 32 weeks   3. H/O pre-eclampsia in prior pregnancy,  currently pregnant See above  4. Hx of cesarean section complicating pregnancy For repeat at 39 weeks  5. Iron deficiency anemia during pregnancy CBC today Continue with PNV  6. Uterine leiomyoma, unspecified location Stable  7. Unwanted fertility BTL papers today  Preterm labor symptoms and general obstetric precautions including but not limited to vaginal bleeding, contractions, leaking of fluid and fetal movement were reviewed in detail with the patient. Please refer to After Visit Summary for other counseling recommendations.  Return in about 2 weeks (around 12/10/2019) for OB visit, virtual, MD provider.   Chancy Milroy, MD

## 2019-11-26 NOTE — Patient Instructions (Signed)
Third Trimester of Pregnancy The third trimester is from week 28 through week 40 (months 7 through 9). The third trimester is a time when the unborn baby (fetus) is growing rapidly. At the end of the ninth month, the fetus is about 20 inches in length and weighs 6-10 pounds. Body changes during your third trimester Your body will continue to go through many changes during pregnancy. The changes vary from woman to woman. During the third trimester:  Your weight will continue to increase. You can expect to gain 25-35 pounds (11-16 kg) by the end of the pregnancy.  You may begin to get stretch marks on your hips, abdomen, and breasts.  You may urinate more often because the fetus is moving lower into your pelvis and pressing on your bladder.  You may develop or continue to have heartburn. This is caused by increased hormones that slow down muscles in the digestive tract.  You may develop or continue to have constipation because increased hormones slow digestion and cause the muscles that push waste through your intestines to relax.  You may develop hemorrhoids. These are swollen veins (varicose veins) in the rectum that can itch or be painful.  You may develop swollen, bulging veins (varicose veins) in your legs.  You may have increased body aches in the pelvis, back, or thighs. This is due to weight gain and increased hormones that are relaxing your joints.  You may have changes in your hair. These can include thickening of your hair, rapid growth, and changes in texture. Some women also have hair loss during or after pregnancy, or hair that feels dry or thin. Your hair will most likely return to normal after your baby is born.  Your breasts will continue to grow and they will continue to become tender. A yellow fluid (colostrum) may leak from your breasts. This is the first milk you are producing for your baby.  Your belly button may stick out.  You may notice more swelling in your hands,  face, or ankles.  You may have increased tingling or numbness in your hands, arms, and legs. The skin on your belly may also feel numb.  You may feel short of breath because of your expanding uterus.  You may have more problems sleeping. This can be caused by the size of your belly, increased need to urinate, and an increase in your body's metabolism.  You may notice the fetus "dropping," or moving lower in your abdomen (lightening).  You may have increased vaginal discharge.  You may notice your joints feel loose and you may have pain around your pelvic bone. What to expect at prenatal visits You will have prenatal exams every 2 weeks until week 36. Then you will have weekly prenatal exams. During a routine prenatal visit:  You will be weighed to make sure you and the baby are growing normally.  Your blood pressure will be taken.  Your abdomen will be measured to track your baby's growth.  The fetal heartbeat will be listened to.  Any test results from the previous visit will be discussed.  You may have a cervical check near your due date to see if your cervix has softened or thinned (effaced).  You will be tested for Group B streptococcus. This happens between 35 and 37 weeks. Your health care provider may ask you:  What your birth plan is.  How you are feeling.  If you are feeling the baby move.  If you have had any abnormal   symptoms, such as leaking fluid, bleeding, severe headaches, or abdominal cramping.  If you are using any tobacco products, including cigarettes, chewing tobacco, and electronic cigarettes.  If you have any questions. Other tests or screenings that may be performed during your third trimester include:  Blood tests that check for low iron levels (anemia).  Fetal testing to check the health, activity level, and growth of the fetus. Testing is done if you have certain medical conditions or if there are problems during the pregnancy.  Nonstress test  (NST). This test checks the health of your baby to make sure there are no signs of problems, such as the baby not getting enough oxygen. During this test, a belt is placed around your belly. The baby is made to move, and its heart rate is monitored during movement. What is false labor? False labor is a condition in which you feel small, irregular tightenings of the muscles in the womb (contractions) that usually go away with rest, changing position, or drinking water. These are called Braxton Hicks contractions. Contractions may last for hours, days, or even weeks before true labor sets in. If contractions come at regular intervals, become more frequent, increase in intensity, or become painful, you should see your health care provider. What are the signs of labor?  Abdominal cramps.  Regular contractions that start at 10 minutes apart and become stronger and more frequent with time.  Contractions that start on the top of the uterus and spread down to the lower abdomen and back.  Increased pelvic pressure and dull back pain.  A watery or bloody mucus discharge that comes from the vagina.  Leaking of amniotic fluid. This is also known as your "water breaking." It could be a slow trickle or a gush. Let your health care provider know if it has a color or strange odor. If you have any of these signs, call your health care provider right away, even if it is before your due date. Follow these instructions at home: Medicines  Follow your health care provider's instructions regarding medicine use. Specific medicines may be either safe or unsafe to take during pregnancy.  Take a prenatal vitamin that contains at least 600 micrograms (mcg) of folic acid.  If you develop constipation, try taking a stool softener if your health care provider approves. Eating and drinking   Eat a balanced diet that includes fresh fruits and vegetables, whole grains, good sources of protein such as meat, eggs, or tofu,  and low-fat dairy. Your health care provider will help you determine the amount of weight gain that is right for you.  Avoid raw meat and uncooked cheese. These carry germs that can cause birth defects in the baby.  If you have low calcium intake from food, talk to your health care provider about whether you should take a daily calcium supplement.  Eat four or five small meals rather than three large meals a day.  Limit foods that are high in fat and processed sugars, such as fried and sweet foods.  To prevent constipation: ? Drink enough fluid to keep your urine clear or pale yellow. ? Eat foods that are high in fiber, such as fresh fruits and vegetables, whole grains, and beans. Activity  Exercise only as directed by your health care provider. Most women can continue their usual exercise routine during pregnancy. Try to exercise for 30 minutes at least 5 days a week. Stop exercising if you experience uterine contractions.  Avoid heavy lifting.  Do   not exercise in extreme heat or humidity, or at high altitudes.  Wear low-heel, comfortable shoes.  Practice good posture.  You may continue to have sex unless your health care provider tells you otherwise. Relieving pain and discomfort  Take frequent breaks and rest with your legs elevated if you have leg cramps or low back pain.  Take warm sitz baths to soothe any pain or discomfort caused by hemorrhoids. Use hemorrhoid cream if your health care provider approves.  Wear a good support bra to prevent discomfort from breast tenderness.  If you develop varicose veins: ? Wear support pantyhose or compression stockings as told by your healthcare provider. ? Elevate your feet for 15 minutes, 3-4 times a day. Prenatal care  Write down your questions. Take them to your prenatal visits.  Keep all your prenatal visits as told by your health care provider. This is important. Safety  Wear your seat belt at all times when driving.  Make  a list of emergency phone numbers, including numbers for family, friends, the hospital, and police and fire departments. General instructions  Avoid cat litter boxes and soil used by cats. These carry germs that can cause birth defects in the baby. If you have a cat, ask someone to clean the litter box for you.  Do not travel far distances unless it is absolutely necessary and only with the approval of your health care provider.  Do not use hot tubs, steam rooms, or saunas.  Do not drink alcohol.  Do not use any products that contain nicotine or tobacco, such as cigarettes and e-cigarettes. If you need help quitting, ask your health care provider.  Do not use any medicinal herbs or unprescribed drugs. These chemicals affect the formation and growth of the baby.  Do not douche or use tampons or scented sanitary pads.  Do not cross your legs for long periods of time.  To prepare for the arrival of your baby: ? Take prenatal classes to understand, practice, and ask questions about labor and delivery. ? Make a trial run to the hospital. ? Visit the hospital and tour the maternity area. ? Arrange for maternity or paternity leave through employers. ? Arrange for family and friends to take care of pets while you are in the hospital. ? Purchase a rear-facing car seat and make sure you know how to install it in your car. ? Pack your hospital bag. ? Prepare the baby's nursery. Make sure to remove all pillows and stuffed animals from the baby's crib to prevent suffocation.  Visit your dentist if you have not gone during your pregnancy. Use a soft toothbrush to brush your teeth and be gentle when you floss. Contact a health care provider if:  You are unsure if you are in labor or if your water has broken.  You become dizzy.  You have mild pelvic cramps, pelvic pressure, or nagging pain in your abdominal area.  You have lower back pain.  You have persistent nausea, vomiting, or  diarrhea.  You have an unusual or bad smelling vaginal discharge.  You have pain when you urinate. Get help right away if:  Your water breaks before 37 weeks.  You have regular contractions less than 5 minutes apart before 37 weeks.  You have a fever.  You are leaking fluid from your vagina.  You have spotting or bleeding from your vagina.  You have severe abdominal pain or cramping.  You have rapid weight loss or weight gain.  You have   shortness of breath with chest pain.  You notice sudden or extreme swelling of your face, hands, ankles, feet, or legs.  Your baby makes fewer than 10 movements in 2 hours.  You have severe headaches that do not go away when you take medicine.  You have vision changes. Summary  The third trimester is from week 28 through week 40, months 7 through 9. The third trimester is a time when the unborn baby (fetus) is growing rapidly.  During the third trimester, your discomfort may increase as you and your baby continue to gain weight. You may have abdominal, leg, and back pain, sleeping problems, and an increased need to urinate.  During the third trimester your breasts will keep growing and they will continue to become tender. A yellow fluid (colostrum) may leak from your breasts. This is the first milk you are producing for your baby.  False labor is a condition in which you feel small, irregular tightenings of the muscles in the womb (contractions) that eventually go away. These are called Braxton Hicks contractions. Contractions may last for hours, days, or even weeks before true labor sets in.  Signs of labor can include: abdominal cramps; regular contractions that start at 10 minutes apart and become stronger and more frequent with time; watery or bloody mucus discharge that comes from the vagina; increased pelvic pressure and dull back pain; and leaking of amniotic fluid. This information is not intended to replace advice given to you by your  health care provider. Make sure you discuss any questions you have with your health care provider. Document Revised: 02/27/2019 Document Reviewed: 12/12/2016 Elsevier Patient Education  2020 [employer]  

## 2019-11-27 LAB — GLUCOSE TOLERANCE, 2 HOURS W/ 1HR
Glucose, 1 hour: 143 mg/dL (ref 65–179)
Glucose, 2 hour: 107 mg/dL (ref 65–152)
Glucose, Fasting: 78 mg/dL (ref 65–91)

## 2019-11-27 LAB — HIV ANTIBODY (ROUTINE TESTING W REFLEX): HIV Screen 4th Generation wRfx: NONREACTIVE

## 2019-11-27 LAB — RPR: RPR Ser Ql: NONREACTIVE

## 2019-11-27 LAB — CBC
Hematocrit: 35.3 % (ref 34.0–46.6)
Hemoglobin: 9.9 g/dL — ABNORMAL LOW (ref 11.1–15.9)
MCH: 21 pg — ABNORMAL LOW (ref 26.6–33.0)
MCHC: 28 g/dL — ABNORMAL LOW (ref 31.5–35.7)
MCV: 75 fL — ABNORMAL LOW (ref 79–97)
NRBC: 1 % — ABNORMAL HIGH (ref 0–0)
Platelets: 319 10*3/uL (ref 150–450)
RBC: 4.71 x10E6/uL (ref 3.77–5.28)
RDW: 20.7 % — ABNORMAL HIGH (ref 11.7–15.4)
WBC: 9.9 10*3/uL (ref 3.4–10.8)

## 2019-12-10 ENCOUNTER — Telehealth (INDEPENDENT_AMBULATORY_CARE_PROVIDER_SITE_OTHER): Payer: Medicaid Other | Admitting: Obstetrics and Gynecology

## 2019-12-10 ENCOUNTER — Encounter: Payer: Self-pay | Admitting: Obstetrics and Gynecology

## 2019-12-10 VITALS — BP 145/95

## 2019-12-10 DIAGNOSIS — O10913 Unspecified pre-existing hypertension complicating pregnancy, third trimester: Secondary | ICD-10-CM

## 2019-12-10 DIAGNOSIS — O119 Pre-existing hypertension with pre-eclampsia, unspecified trimester: Secondary | ICD-10-CM | POA: Insufficient documentation

## 2019-12-10 DIAGNOSIS — O10919 Unspecified pre-existing hypertension complicating pregnancy, unspecified trimester: Secondary | ICD-10-CM

## 2019-12-10 DIAGNOSIS — Z3A30 30 weeks gestation of pregnancy: Secondary | ICD-10-CM

## 2019-12-10 DIAGNOSIS — O0990 Supervision of high risk pregnancy, unspecified, unspecified trimester: Secondary | ICD-10-CM

## 2019-12-10 DIAGNOSIS — Z3009 Encounter for other general counseling and advice on contraception: Secondary | ICD-10-CM

## 2019-12-10 DIAGNOSIS — O09293 Supervision of pregnancy with other poor reproductive or obstetric history, third trimester: Secondary | ICD-10-CM

## 2019-12-10 DIAGNOSIS — O34219 Maternal care for unspecified type scar from previous cesarean delivery: Secondary | ICD-10-CM

## 2019-12-10 DIAGNOSIS — O0993 Supervision of high risk pregnancy, unspecified, third trimester: Secondary | ICD-10-CM

## 2019-12-10 DIAGNOSIS — O113 Pre-existing hypertension with pre-eclampsia, third trimester: Secondary | ICD-10-CM

## 2019-12-10 DIAGNOSIS — O09299 Supervision of pregnancy with other poor reproductive or obstetric history, unspecified trimester: Secondary | ICD-10-CM

## 2019-12-10 HISTORY — DX: Pre-existing hypertension with pre-eclampsia, unspecified trimester: O11.9

## 2019-12-10 NOTE — Progress Notes (Signed)
   [place] VIRTUAL VIDEO VISIT ENCOUNTER NOTE  Provider location: [facility] [place] [facility]   I connected with Danielle Short on 12/10/19 at  8:45 AM EST by MyChart Video Encounter at home and verified that I am speaking with the correct person using two identifiers.   I discussed the limitations, risks, security and privacy concerns of performing an evaluation and management service virtually and the availability of in person appointments. I also discussed with the patient that there may be a patient responsible charge related to this service. The patient expressed understanding and agreed to proceed. Subjective:  Danielle Short is a 34 y.o. [phone] at [redacted]w[redacted]d being seen today for ongoing prenatal care.  She is currently monitored for the following issues for this high-risk pregnancy and has Supervision of high risk pregnancy, antepartum; Chronic hypertension affecting pregnancy; Uterine myoma; Hx of cesarean section complicating pregnancy; Iron deficiency anemia during pregnancy; H/O pre-eclampsia in prior pregnancy, currently pregnant; Unwanted fertility; and Pre-eclampsia added to pre-existing hypertension on their problem list.  Patient reports no complaints.  Contractions: Not present. Vag. Bleeding: None.  Movement: Present. Denies any leaking of fluid.   The following portions of the patient's history were reviewed and updated as appropriate: allergies, current medications, past family history, past medical history, past social history, past surgical history and problem list.   Objective:   Vitals:   12/10/19 0916  BP: (!) 145/95    Fetal Status:     Movement: Present     General:  Alert, oriented and cooperative. Patient is in no acute distress.  Respiratory: Normal respiratory effort, no problems with respiration noted  Mental Status: Normal mood and affect. Normal behavior. Normal judgment and thought content.  Rest of physical exam deferred due to  type of encounter  Assessment and Plan:  Pregnancy: [id number] at [redacted]w[redacted]d  1. Supervision of high risk pregnancy, antepartum Doing well, no issues  2. Chronic hypertension affecting pregnancy Has not taken meds yet today Cont labetalol 200 mg BID To start antenatal testing @ 32 weeks  3. Hx of cesarean section complicating pregnancy RCS scheduled today  4. H/O pre-eclampsia in prior pregnancy, currently pregnant Cont baby ASA  5. Unwanted fertility For BTL  6. Pre-eclampsia added to pre-existing hypertension Needs RCS at 37 weeks  Preterm labor symptoms and general obstetric precautions including but not limited to vaginal bleeding, contractions, leaking of fluid and fetal movement were reviewed in detail with the patient. I discussed the assessment and treatment plan with the patient. The patient was provided an opportunity to ask questions and all were answered. The patient agreed with the plan and demonstrated an understanding of the instructions. The patient was advised to call back or seek an in-person office evaluation/go to MAU at [facility] for any urgent or concerning symptoms. Please refer to After Visit Summary for other counseling recommendations.   I provided 15 minutes of face-to-face time during this encounter.  Return in about 10 days (around 12/20/2019) for high OB, in person, needs to sign BTL papers.  Future Appointments  Date Time Provider [place]  12/22/2019  7:45 AM WH-MFC [place] 2 WH-MFCUS MFC-US  12/22/2019  7:50 AM [place], [place] for [place], [place]

## 2019-12-10 NOTE — Progress Notes (Signed)
S/w pt for virtual visit. Pt reports fetal movement and denies pain. Pt states that she cannot find her BP cuff at the moment.

## 2019-12-15 ENCOUNTER — Inpatient Hospital Stay (HOSPITAL_COMMUNITY)
Admission: AD | Admit: 2019-12-15 | Discharge: 2019-12-16 | Disposition: A | Discharge status: Home / Self Care | Payer: Medicaid Other | Source: Ambulatory Visit | Attending: Obstetrics & Gynecology | Admitting: Obstetrics & Gynecology

## 2019-12-15 ENCOUNTER — Encounter (HOSPITAL_COMMUNITY): Payer: Self-pay | Admitting: Obstetrics & Gynecology

## 2019-12-15 DIAGNOSIS — Z7982 Long term (current) use of aspirin: Secondary | ICD-10-CM | POA: Diagnosis not present

## 2019-12-15 DIAGNOSIS — Z3A31 31 weeks gestation of pregnancy: Secondary | ICD-10-CM | POA: Insufficient documentation

## 2019-12-15 DIAGNOSIS — O119 Pre-existing hypertension with pre-eclampsia, unspecified trimester: Secondary | ICD-10-CM

## 2019-12-15 DIAGNOSIS — Z87891 Personal history of nicotine dependence: Secondary | ICD-10-CM | POA: Insufficient documentation

## 2019-12-15 DIAGNOSIS — O10919 Unspecified pre-existing hypertension complicating pregnancy, unspecified trimester: Secondary | ICD-10-CM

## 2019-12-15 DIAGNOSIS — O113 Pre-existing hypertension with pre-eclampsia, third trimester: Secondary | ICD-10-CM

## 2019-12-15 DIAGNOSIS — O34219 Maternal care for unspecified type scar from previous cesarean delivery: Secondary | ICD-10-CM | POA: Diagnosis not present

## 2019-12-15 DIAGNOSIS — O10013 Pre-existing essential hypertension complicating pregnancy, third trimester: Secondary | ICD-10-CM | POA: Insufficient documentation

## 2019-12-15 DIAGNOSIS — O0990 Supervision of high risk pregnancy, unspecified, unspecified trimester: Secondary | ICD-10-CM

## 2019-12-15 DIAGNOSIS — D259 Leiomyoma of uterus, unspecified: Secondary | ICD-10-CM

## 2019-12-15 DIAGNOSIS — O10913 Unspecified pre-existing hypertension complicating pregnancy, third trimester: Secondary | ICD-10-CM

## 2019-12-15 LAB — CBC
HCT: 36.4 % (ref 36.0–46.0)
HCT: 36.9 % (ref 36.0–46.0)
Hemoglobin: 11.2 g/dL — ABNORMAL LOW (ref 12.0–15.0)
Hemoglobin: 11.2 g/dL — ABNORMAL LOW (ref 12.0–15.0)
MCH: 25.3 pg — ABNORMAL LOW (ref 26.0–34.0)
MCH: 25.7 pg — ABNORMAL LOW (ref 26.0–34.0)
MCHC: 30.4 g/dL (ref 30.0–36.0)
MCHC: 30.8 g/dL (ref 30.0–36.0)
MCV: 83.5 fL (ref 80.0–100.0)
MCV: 83.7 fL (ref 80.0–100.0)
Platelets: 218 10*3/uL (ref 150–400)
Platelets: 229 10*3/uL (ref 150–400)
RBC: 4.35 MIL/uL (ref 3.87–5.11)
RBC: 4.42 MIL/uL (ref 3.87–5.11)
WBC: 7.2 10*3/uL (ref 4.0–10.5)
WBC: 7.2 10*3/uL (ref 4.0–10.5)
nRBC: 0.3 % — ABNORMAL HIGH (ref 0.0–0.2)
nRBC: 0.4 % — ABNORMAL HIGH (ref 0.0–0.2)

## 2019-12-15 NOTE — MAU Note (Signed)
PT SAYS BP HIGH AT HOME - 160/106  AT 905PM.    10PM-160/84.  TAKES LABETALOL BID 200 MG .

## 2019-12-15 NOTE — MAU Provider Note (Signed)
History     CSN: [id number]  Arrival date and time: 12/15/19 2241   First Provider Initiated Contact with Patient 12/15/19 2327      Chief Complaint  Patient presents with  . Hypertension   HPI Saragrace Sproles is a 34 y.o. [phone] at [redacted]w[redacted]d who presents to MAU with chief complaint of severe range blood pressure on her home cuff in setting of Chronic Hypertension with Superimposed Preeclampsia. She endorses taking her evening dose of Labetalol 200 mg between 7 and 8pm. She then obtained a bp reading of 160/106 at 905pm and 160/84 at 10pm. She denies headache, visual disturbances, RUQ/epigastric pain, new onset swelling or weight gain. She also denies vaginal bleeding, leaking of fluid, decreased fetal movement, fever, falls, or recent illness.    OB History    Gravida  4   Para  2   Term  1   Preterm  1   AB  1   Living  1     SAB      TAB  1   Ectopic      Multiple      Live Births  2        Obstetric Comments  Son passed away 04-29-10        Past Medical History:  Diagnosis Date  . Anemia   . Hypertension during pregnancy  . Pregnancy induced hypertension   . Uterine fibroids affecting pregnancy     Past Surgical History:  Procedure Laterality Date  . CESAREAN SECTION      Family History  Problem Relation Age of Onset  . Diabetes Mother   . Hypertension Mother   . Kidney disease Mother   . Cancer Father     Social History   Tobacco Use  . Smoking status: Former Smoker    Types: Cigars    Quit date: 06/25/2019    Years since quitting: 0.4  . Smokeless tobacco: Never Used  Substance Use Topics  . Alcohol use: No  . Drug use: No    Allergies: No Known Allergies  Medications Prior to Admission  Medication Sig Dispense Refill Last Dose  . aspirin EC 81 MG tablet Take 1 tablet (81 mg total) by mouth daily. Take after 12 weeks for prevention of preeclampsia later in pregnancy 300 tablet 0 12/15/2019 at Unknown time  . Doxylamine-Pyridoxine  (DICLEGIS) 10-10 MG TBEC Take 2 tablets by mouth at bedtime. If symptoms persist, add one tablet in the morning and one in the afternoon 100 tablet 5 12/15/2019 at Unknown time  . ferrous sulfate (FERROUSUL) 325 (65 FE) MG tablet Take 1 tablet (325 mg total) by mouth 2 (two) times daily. 60 tablet 1 12/15/2019 at Unknown time  . labetalol (NORMODYNE) 200 MG tablet Take 1 tablet (200 mg total) by mouth 2 (two) times daily. 60 tablet 3 12/15/2019 at Unknown time  . Prenatal MV-Min-FA-Omega-3 (PRENATAL GUMMIES/DHA & FA) 0.4-32.5 MG CHEW Chew 3 tablets by mouth daily. 90 tablet 12 12/15/2019 at Unknown time  . Blood Pressure Monitoring (BLOOD PRESSURE CUFF) MISC 1 Device by Does not apply route once a week. 1 each 0   . cyclobenzaprine (FLEXERIL) 10 MG tablet Take 1 tablet (10 mg total) by mouth 2 (two) times daily as needed for muscle spasms. (Patient not taking: Reported on 11/24/2019) 20 tablet 0   . Elastic Bandages & Supports (COMFORT FIT MATERNITY SUPP LG) MISC 1 Units by Does not apply route daily. 1 each 0   . ondansetron (  ZOFRAN ODT) 4 MG disintegrating tablet Take 1 tablet (4 mg total) by mouth every 6 (six) hours as needed for nausea. (Patient not taking: Reported on 11/24/2019) 20 tablet 1     Review of Systems  Constitutional: Negative for chills, fatigue and fever.  Eyes: Negative for visual disturbance.  Respiratory: Negative for shortness of breath.   Cardiovascular: Negative for palpitations.  Gastrointestinal: Negative for abdominal pain.  Genitourinary: Negative for dysuria and vaginal bleeding.  Musculoskeletal: Negative for back pain.  Neurological: Negative for headaches.  All other systems reviewed and are negative.  Physical Exam   Blood pressure (!) 147/91, pulse 82, temperature 99.3 F (37.4 C), temperature source Oral, resp. rate 18, height 5' 4.5" (1.638 m), weight 91.9 kg, last menstrual period 05/08/2019.  Physical Exam  Nursing note and vitals reviewed. Constitutional:  She is oriented to person, place, and time. She appears well-developed and well-nourished.  Cardiovascular: Normal rate.  Respiratory: Effort normal and breath sounds normal.  GI: She exhibits no distension. There is no abdominal tenderness. There is no rebound and no guarding.  Gravid  Neurological: She is alert and oriented to person, place, and time.  Skin: Skin is warm and dry.  Psychiatric: She has a normal mood and affect. Her behavior is normal. Judgment and thought content normal.    MAU Course/MDM  Procedures  --Reactive tracing: baseline 145, mod variability, positive accels, concern for decel x 1 at 0008 --[place]: quiet --BPP 8/8 --P:Cr increased from 0.42 on 11/21/2019 to 0.93 tonight. No severe range, no severe symptoms throughout evaluation in MAU then severe range at discharge, patient remains otherwise asymptomatic. Reviewed with Dr. Elonda Husky who advises increase in Labetalol frequency and dosage as below --Close follow-up already coordinated: Next [facility] 12/18/2019, MFM 12/22/2019 --Labetalol 400 mg given at [place]  Patient Vitals for the past 24 hrs:  BP Temp Temp src Pulse Resp Height Weight  12/16/19 0249 (!) 166/102 -- -- 62 -- -- --  12/16/19 0141 -- -- -- 62 -- -- --  12/16/19 0031 (!) 145/90 -- -- 72 -- -- --  12/16/19 0016 140/76 -- -- 73 -- -- --  12/16/19 0001 (!) 143/94 -- -- 75 -- -- --  12/15/19 2346 (!) 134/93 -- -- 85 -- -- --  12/15/19 2331 135/84 -- -- 87 -- -- --  12/15/19 2317 (!) 147/91 -- -- 82 -- -- --  12/15/19 2256 (!) 147/92 99.3 F (37.4 C) Oral 87 18 5' 4.5" (1.638 m) 91.9 kg   Results for orders placed or performed during the hospital encounter of 12/15/19 (from the past 24 hour(s))  Protein / creatinine ratio, urine     Status: Abnormal   Collection Time: 12/15/19 11:08 PM  Result Value Ref Range   Creatinine, Urine 207.65 mg/dL   Total Protein, Urine 194 mg/dL   Protein Creatinine Ratio 0.93 (H) 0.00 - 0.15 mg/mg[Cre]  CBC     Status:  Abnormal   Collection Time: 12/15/19 11:12 PM  Result Value Ref Range   WBC 7.2 4.0 - 10.5 K/uL   RBC 4.42 3.87 - 5.11 MIL/uL   Hemoglobin 11.2 (L) 12.0 - 15.0 g/dL   HCT 36.9 36.0 - 46.0 %   MCV 83.5 80.0 - 100.0 fL   MCH 25.3 (L) 26.0 - 34.0 pg   MCHC 30.4 30.0 - 36.0 g/dL   Platelets 218 150 - 400 K/uL   nRBC 0.3 (H) 0.0 - 0.2 %  Comprehensive metabolic panel  Status: Abnormal   Collection Time: 12/15/19 11:12 PM  Result Value Ref Range   Sodium 138 135 - 145 mmol/L   Potassium 4.1 3.5 - 5.1 mmol/L   Chloride 108 98 - 111 mmol/L   CO2 21 (L) 22 - 32 mmol/L   Glucose, Bld 97 70 - 99 mg/dL   BUN 12 6 - 20 mg/dL   Creatinine, Ser 0.66 0.44 - 1.00 mg/dL   Calcium 9.2 8.9 - 10.3 mg/dL   Total Protein 6.2 (L) 6.5 - 8.1 g/dL   Albumin 2.9 (L) 3.5 - 5.0 g/dL   AST 33 15 - 41 U/L   ALT 24 0 - 44 U/L   Alkaline Phosphatase 70 38 - 126 U/L   Total Bilirubin 0.3 0.3 - 1.2 mg/dL   GFR calc non Af Amer >60 >60 mL/min   GFR calc Af Amer >60 >60 mL/min   Anion gap 9 5 - 15  CBC     Status: Abnormal   Collection Time: 12/15/19 11:12 PM  Result Value Ref Range   WBC 7.2 4.0 - 10.5 K/uL   RBC 4.35 3.87 - 5.11 MIL/uL   Hemoglobin 11.2 (L) 12.0 - 15.0 g/dL   HCT 36.4 36.0 - 46.0 %   MCV 83.7 80.0 - 100.0 fL   MCH 25.7 (L) 26.0 - 34.0 pg   MCHC 30.8 30.0 - 36.0 g/dL   RDW Not Measured 11.5 - 15.5 %   Platelets 229 150 - 400 K/uL   nRBC 0.4 (H) 0.0 - 0.2 %   Meds ordered this encounter  Medications  . lactated ringers bolus 500 mL  . labetalol (NORMODYNE) tablet 400 mg  . labetalol (NORMODYNE) 200 MG tablet    Sig: Take 2 tablets (400 mg total) by mouth 3 (three) times daily.    Dispense:  60 tablet    Refill:  3    Order Specific Question:   Supervising Provider    Answer:   Florian Buff [[id number]]    Assessment and Plan  --34 y.o. [id number] at [redacted]w[redacted]d  --Reactive tracing, BPP 8/8 --CHTN with Superimposed Preeclampsia, new regimen as above --Discharge home in stable condition  with precautions  Darlina Rumpf, CNM 12/16/2019, 3:24 AM

## 2019-12-16 ENCOUNTER — Other Ambulatory Visit: Payer: Self-pay | Admitting: Obstetrics & Gynecology

## 2019-12-16 ENCOUNTER — Inpatient Hospital Stay (HOSPITAL_BASED_OUTPATIENT_CLINIC_OR_DEPARTMENT_OTHER): Payer: Medicaid Other

## 2019-12-16 DIAGNOSIS — O113 Pre-existing hypertension with pre-eclampsia, third trimester: Secondary | ICD-10-CM

## 2019-12-16 DIAGNOSIS — O10013 Pre-existing essential hypertension complicating pregnancy, third trimester: Secondary | ICD-10-CM

## 2019-12-16 DIAGNOSIS — Z3A31 31 weeks gestation of pregnancy: Secondary | ICD-10-CM

## 2019-12-16 DIAGNOSIS — O368930 Maternal care for other specified fetal problems, third trimester, not applicable or unspecified: Secondary | ICD-10-CM

## 2019-12-16 DIAGNOSIS — O10913 Unspecified pre-existing hypertension complicating pregnancy, third trimester: Secondary | ICD-10-CM | POA: Diagnosis not present

## 2019-12-16 DIAGNOSIS — O34219 Maternal care for unspecified type scar from previous cesarean delivery: Secondary | ICD-10-CM

## 2019-12-16 LAB — PROTEIN / CREATININE RATIO, URINE
Creatinine, Urine: 207.65 mg/dL
Protein Creatinine Ratio: 0.93 mg/mg{Cre} — ABNORMAL HIGH (ref 0.00–0.15)
Total Protein, Urine: 194 mg/dL

## 2019-12-16 LAB — COMPREHENSIVE METABOLIC PANEL
ALT: 24 U/L (ref 0–44)
AST: 33 U/L (ref 15–41)
Albumin: 2.9 g/dL — ABNORMAL LOW (ref 3.5–5.0)
Alkaline Phosphatase: 70 U/L (ref 38–126)
Anion gap: 9 (ref 5–15)
BUN: 12 mg/dL (ref 6–20)
CO2: 21 mmol/L — ABNORMAL LOW (ref 22–32)
Calcium: 9.2 mg/dL (ref 8.9–10.3)
Chloride: 108 mmol/L (ref 98–111)
Creatinine, Ser: 0.66 mg/dL (ref 0.44–1.00)
GFR calc Af Amer: >60 mL/min (ref >60)
GFR calc non Af Amer: >60 mL/min (ref >60)
Glucose, Bld: 97 mg/dL (ref 70–99)
Potassium: 4.1 mmol/L (ref 3.5–5.1)
Sodium: 138 mmol/L (ref 135–145)
Total Bilirubin: 0.3 mg/dL (ref 0.3–1.2)
Total Protein: 6.2 g/dL — ABNORMAL LOW (ref 6.5–8.1)

## 2019-12-16 MED ORDER — LABETALOL HCL 200 MG PO TABS: 400.0000 mg | ORAL_TABLET | Freq: Three times a day (TID) | ORAL | 3 refills | Status: DC

## 2019-12-16 MED ORDER — LABETALOL HCL 100 MG PO TABS
400.0000 mg | ORAL_TABLET | Freq: Once | ORAL | Status: AC
  Administered 2019-12-16: 02:00:00 400 mg via ORAL
  Filled 2019-12-16: qty 4

## 2019-12-16 MED ORDER — LACTATED RINGERS IV BOLUS
500.0000 mL | Freq: Once | INTRAVENOUS | Status: AC
  Administered 2019-12-16: 500 mL via INTRAVENOUS

## 2019-12-16 NOTE — Discharge Instructions (Signed)
Preeclampsia and Eclampsia Preeclampsia is a serious condition that may develop during pregnancy. This condition causes high blood pressure and increased protein in your urine along with other symptoms, such as headaches and vision changes. These symptoms may develop as the condition gets worse. Preeclampsia may occur at 20 weeks of pregnancy or later. Diagnosing and treating preeclampsia early is very important. If not treated early, it can cause serious problems for you and your baby. One problem it can lead to is eclampsia. Eclampsia is a condition that causes muscle jerking or shaking (convulsions or seizures) and other serious problems for the mother. During pregnancy, delivering your baby may be the best treatment for preeclampsia or eclampsia. For most women, preeclampsia and eclampsia symptoms go away after giving birth. In rare cases, a woman may develop preeclampsia after giving birth (postpartum preeclampsia). This usually occurs within 48 hours after childbirth but may occur up to 6 weeks after giving birth. What are the causes? The cause of preeclampsia is not known. What increases the risk? The following risk factors make you more likely to develop preeclampsia:  Being pregnant for the first time.  Having had preeclampsia during a past pregnancy.  Having a family history of preeclampsia.  Having high blood pressure.  Being pregnant with more than one baby.  Being 35 or older.  Being African-American.  Having kidney disease or diabetes.  Having medical conditions such as lupus or blood diseases.  Being very overweight (obese). What are the signs or symptoms? The most common symptoms are:  Severe headaches.  Vision problems, such as blurred or double vision.  Abdominal pain, especially upper abdominal pain. Other symptoms that may develop as the condition gets worse include:  Sudden weight gain.  Sudden swelling of the hands, face, legs, and feet.  Severe nausea  and vomiting.  Numbness in the face, arms, legs, and feet.  Dizziness.  Urinating less than usual.  Slurred speech.  Convulsions or seizures. How is this diagnosed? There are no screening tests for preeclampsia. Your health care provider will ask you about symptoms and check for signs of preeclampsia during your prenatal visits. You may also have tests that include:  Checking your blood pressure.  Urine tests to check for protein. Your health care provider will check for this at every prenatal visit.  Blood tests.  Monitoring your baby's heart rate.  Ultrasound. How is this treated? You and your health care provider will determine the treatment approach that is best for you. Treatment may include:  Having more frequent prenatal exams to check for signs of preeclampsia, if you have an increased risk for preeclampsia.  Medicine to lower your blood pressure.  Staying in the hospital, if your condition is severe. There, treatment will focus on controlling your blood pressure and the amount of fluids in your body (fluid retention).  Taking medicine (magnesium sulfate) to prevent seizures. This may be given as an injection or through an IV.  Taking a low-dose aspirin during your pregnancy.  Delivering your baby early. You may have your labor started with medicine (induced), or you may have a cesarean delivery. Follow these instructions at home: Eating and drinking   Drink enough fluid to keep your urine pale yellow.  Avoid caffeine. Lifestyle  Do not use any products that contain nicotine or tobacco, such as cigarettes and e-cigarettes. If you need help quitting, ask your health care provider.  Do not use alcohol or drugs.  Avoid stress as much as possible. Rest and get   plenty of sleep. General instructions  Take over-the-counter and prescription medicines only as told by your health care provider.  When lying down, lie on your left side. This keeps pressure off your  major blood vessels.  When sitting or lying down, raise (elevate) your feet. Try putting some pillows underneath your lower legs.  Exercise regularly. Ask your health care provider what kinds of exercise are best for you.  Keep all follow-up and prenatal visits as told by your health care provider. This is important. How is this prevented? There is no known way of preventing preeclampsia or eclampsia from developing. However, to lower your risk of complications and detect problems early:  Get regular prenatal care. Your health care provider may be able to diagnose and treat the condition early.  Maintain a healthy weight. Ask your health care provider for help managing weight gain during pregnancy.  Work with your health care provider to manage any long-term (chronic) health conditions you have, such as diabetes or kidney problems.  You may have tests of your blood pressure and kidney function after giving birth.  Your health care provider may have you take low-dose aspirin during your next pregnancy. Contact a health care provider if:  You have symptoms that your health care provider told you may require more treatment or monitoring, such as: ? Headaches. ? Nausea or vomiting. ? Abdominal pain. ? Dizziness. ? Light-headedness. Get help right away if:  You have severe: ? Abdominal pain. ? Headaches that do not get better. ? Dizziness. ? Vision problems. ? Confusion. ? Nausea or vomiting.  You have any of the following: ? A seizure. ? Sudden, rapid weight gain. ? Sudden swelling in your hands, ankles, or face. ? Trouble moving any part of your body. ? Numbness in any part of your body. ? Trouble speaking. ? Abnormal bleeding.  You faint. Summary  Preeclampsia is a serious condition that may develop during pregnancy.  This condition causes high blood pressure and increased protein in your urine along with other symptoms, such as headaches and vision  changes.  Diagnosing and treating preeclampsia early is very important. If not treated early, it can cause serious problems for you and your baby.  Get help right away if you have symptoms that your health care provider told you to watch for. This information is not intended to replace advice given to you by your health care provider. Make sure you discuss any questions you have with your health care provider. Document Revised: 07/09/2018 Document Reviewed: 06/12/2016 Elsevier Patient Education  2020 [employer]  

## 2019-12-16 NOTE — Progress Notes (Signed)
Orders for surgery 

## 2019-12-17 ENCOUNTER — Telehealth: Payer: Self-pay

## 2019-12-17 NOTE — Telephone Encounter (Signed)
Return call to pt regarding message on triage vm Pt answered however Not able to hear anything  Pt has scheduled appt tomorrow in office . I see pt was seen in MAU on yesterday regarding B/P.  Will attempt to call pt back

## 2019-12-18 ENCOUNTER — Other Ambulatory Visit: Payer: Self-pay

## 2019-12-18 ENCOUNTER — Encounter: Payer: Self-pay | Admitting: Obstetrics and Gynecology

## 2019-12-18 ENCOUNTER — Ambulatory Visit (INDEPENDENT_AMBULATORY_CARE_PROVIDER_SITE_OTHER): Payer: Medicaid Other | Admitting: Obstetrics and Gynecology

## 2019-12-18 VITALS — BP 143/93 | HR 81 | Wt 203.1 lb

## 2019-12-18 DIAGNOSIS — Z3A32 32 weeks gestation of pregnancy: Secondary | ICD-10-CM

## 2019-12-18 DIAGNOSIS — O09299 Supervision of pregnancy with other poor reproductive or obstetric history, unspecified trimester: Secondary | ICD-10-CM

## 2019-12-18 DIAGNOSIS — O113 Pre-existing hypertension with pre-eclampsia, third trimester: Secondary | ICD-10-CM

## 2019-12-18 DIAGNOSIS — O119 Pre-existing hypertension with pre-eclampsia, unspecified trimester: Secondary | ICD-10-CM

## 2019-12-18 DIAGNOSIS — O0993 Supervision of high risk pregnancy, unspecified, third trimester: Secondary | ICD-10-CM

## 2019-12-18 DIAGNOSIS — O0990 Supervision of high risk pregnancy, unspecified, unspecified trimester: Secondary | ICD-10-CM

## 2019-12-18 DIAGNOSIS — O34219 Maternal care for unspecified type scar from previous cesarean delivery: Secondary | ICD-10-CM

## 2019-12-18 DIAGNOSIS — O09293 Supervision of pregnancy with other poor reproductive or obstetric history, third trimester: Secondary | ICD-10-CM

## 2019-12-18 DIAGNOSIS — O10919 Unspecified pre-existing hypertension complicating pregnancy, unspecified trimester: Secondary | ICD-10-CM

## 2019-12-18 DIAGNOSIS — O10913 Unspecified pre-existing hypertension complicating pregnancy, third trimester: Secondary | ICD-10-CM

## 2019-12-18 DIAGNOSIS — Z3009 Encounter for other general counseling and advice on contraception: Secondary | ICD-10-CM

## 2019-12-18 NOTE — Progress Notes (Signed)
   PRENATAL VISIT NOTE  Subjective:  Danielle Short is a 34 y.o. [phone] at [redacted]w[redacted]d being seen today for ongoing prenatal care.  She is currently monitored for the following issues for this high-risk pregnancy and has Supervision of high risk pregnancy, antepartum; Chronic hypertension affecting pregnancy; Uterine myoma; Hx of cesarean section complicating pregnancy; Iron deficiency anemia during pregnancy; H/O pre-eclampsia in prior pregnancy, currently pregnant; Unwanted fertility; and Pre-eclampsia added to pre-existing hypertension on their problem list.  Patient reports no complaints.  Contractions: Irritability. Vag. Bleeding: None.  Movement: Present. Denies leaking of fluid.   The following portions of the patient's history were reviewed and updated as appropriate: allergies, current medications, past family history, past medical history, past social history, past surgical history and problem list.   Objective:   Vitals:   12/18/19 0910  BP: (!) 143/93  Pulse: 81  Weight: 203 lb 1.6 oz (92.1 kg)    Fetal Status: Fetal Heart Rate (bpm): 140   Movement: Present     General:  Alert, oriented and cooperative. Patient is in no acute distress.  Skin: Skin is warm and dry. No rash noted.   Cardiovascular: Normal heart rate noted  Respiratory: Normal respiratory effort, no problems with respiration noted  Abdomen: Soft, gravid, appropriate for gestational age.  Pain/Pressure: Present     Pelvic: Cervical exam deferred        Extremities: Normal range of motion.  Edema: Trace  Mental Status: Normal mood and affect. Normal behavior. Normal judgment and thought content.   Assessment and Plan:  Pregnancy: [id number] at [redacted]w[redacted]d  1. Supervision of high risk pregnancy, antepartum  2. Hx of cesarean section complicating pregnancy Scheduled for RCS  3. H/O pre-eclampsia in prior pregnancy, currently pregnant  4. Unwanted fertility For BTL  5. Chronic hypertension affecting  pregnancy Increased to 400 mg TID Has f/u BPP 1/26  6. Pre-eclampsia added to pre-existing hypertension RCS scheduled for 37 weeks  Preterm labor symptoms and general obstetric precautions including but not limited to vaginal bleeding, contractions, leaking of fluid and fetal movement were reviewed in detail with the patient. Please refer to After Visit Summary for other counseling recommendations.   Return in about 2 weeks (around 01/01/2020) for virtual, high OB.  Future Appointments  Date Time Provider [place]  12/22/2019  7:45 AM WH-MFC [place] 2 WH-MFCUS MFC-US  12/22/2019  7:50 AM WH-MFC NURSE WH-MFC MFC-US    Sloan Leiter, MD

## 2019-12-18 NOTE — Progress Notes (Signed)
Pt presents for ROB. Pt has no concerns today.

## 2019-12-22 ENCOUNTER — Ambulatory Visit (HOSPITAL_COMMUNITY): Payer: Medicaid Other | Admitting: *Deleted

## 2019-12-22 ENCOUNTER — Telehealth: Payer: Self-pay

## 2019-12-22 ENCOUNTER — Inpatient Hospital Stay (HOSPITAL_COMMUNITY)
Admission: AD | Admit: 2019-12-22 | Discharge: 2019-12-30 | DRG: 785 | Disposition: A | Discharge status: Home / Self Care | Payer: Medicaid Other | Attending: Obstetrics and Gynecology | Admitting: Obstetrics and Gynecology

## 2019-12-22 ENCOUNTER — Encounter (HOSPITAL_COMMUNITY): Payer: Self-pay

## 2019-12-22 ENCOUNTER — Ambulatory Visit (HOSPITAL_BASED_OUTPATIENT_CLINIC_OR_DEPARTMENT_OTHER)
Admission: RE | Admit: 2019-12-22 | Discharge: 2019-12-22 | Disposition: A | Discharge status: Home / Self Care | Payer: Medicaid Other | Source: Ambulatory Visit | Attending: Obstetrics and Gynecology | Admitting: Obstetrics and Gynecology

## 2019-12-22 ENCOUNTER — Ambulatory Visit (HOSPITAL_COMMUNITY): Payer: Medicaid Other

## 2019-12-22 ENCOUNTER — Other Ambulatory Visit (HOSPITAL_COMMUNITY): Payer: Self-pay | Admitting: Obstetrics

## 2019-12-22 ENCOUNTER — Encounter (HOSPITAL_COMMUNITY): Payer: Self-pay | Admitting: Obstetrics and Gynecology

## 2019-12-22 ENCOUNTER — Other Ambulatory Visit (HOSPITAL_COMMUNITY): Payer: Self-pay | Admitting: *Deleted

## 2019-12-22 ENCOUNTER — Other Ambulatory Visit: Payer: Self-pay

## 2019-12-22 VITALS — BP 152/97 | HR 72

## 2019-12-22 DIAGNOSIS — Z3A32 32 weeks gestation of pregnancy: Secondary | ICD-10-CM

## 2019-12-22 DIAGNOSIS — Z362 Encounter for other antenatal screening follow-up: Secondary | ICD-10-CM | POA: Diagnosis not present

## 2019-12-22 DIAGNOSIS — Z364 Encounter for antenatal screening for fetal growth retardation: Secondary | ICD-10-CM

## 2019-12-22 DIAGNOSIS — Z20822 Contact with and (suspected) exposure to covid-19: Secondary | ICD-10-CM | POA: Diagnosis present

## 2019-12-22 DIAGNOSIS — O9902 Anemia complicating childbirth: Secondary | ICD-10-CM | POA: Diagnosis present

## 2019-12-22 DIAGNOSIS — O365930 Maternal care for other known or suspected poor fetal growth, third trimester, not applicable or unspecified: Secondary | ICD-10-CM | POA: Diagnosis present

## 2019-12-22 DIAGNOSIS — O10919 Unspecified pre-existing hypertension complicating pregnancy, unspecified trimester: Secondary | ICD-10-CM | POA: Diagnosis present

## 2019-12-22 DIAGNOSIS — O1002 Pre-existing essential hypertension complicating childbirth: Principal | ICD-10-CM | POA: Diagnosis present

## 2019-12-22 DIAGNOSIS — O34219 Maternal care for unspecified type scar from previous cesarean delivery: Secondary | ICD-10-CM | POA: Insufficient documentation

## 2019-12-22 DIAGNOSIS — Z87891 Personal history of nicotine dependence: Secondary | ICD-10-CM | POA: Diagnosis not present

## 2019-12-22 DIAGNOSIS — O365990 Maternal care for other known or suspected poor fetal growth, unspecified trimester, not applicable or unspecified: Secondary | ICD-10-CM | POA: Insufficient documentation

## 2019-12-22 DIAGNOSIS — Z98891 History of uterine scar from previous surgery: Secondary | ICD-10-CM

## 2019-12-22 DIAGNOSIS — O0990 Supervision of high risk pregnancy, unspecified, unspecified trimester: Secondary | ICD-10-CM | POA: Insufficient documentation

## 2019-12-22 DIAGNOSIS — D259 Leiomyoma of uterus, unspecified: Secondary | ICD-10-CM | POA: Diagnosis present

## 2019-12-22 DIAGNOSIS — O34211 Maternal care for low transverse scar from previous cesarean delivery: Secondary | ICD-10-CM | POA: Diagnosis present

## 2019-12-22 DIAGNOSIS — Z3A33 33 weeks gestation of pregnancy: Secondary | ICD-10-CM | POA: Diagnosis not present

## 2019-12-22 DIAGNOSIS — O113 Pre-existing hypertension with pre-eclampsia, third trimester: Secondary | ICD-10-CM | POA: Diagnosis not present

## 2019-12-22 DIAGNOSIS — O3413 Maternal care for benign tumor of corpus uteri, third trimester: Secondary | ICD-10-CM | POA: Diagnosis present

## 2019-12-22 DIAGNOSIS — D509 Iron deficiency anemia, unspecified: Secondary | ICD-10-CM | POA: Diagnosis present

## 2019-12-22 DIAGNOSIS — O119 Pre-existing hypertension with pre-eclampsia, unspecified trimester: Secondary | ICD-10-CM

## 2019-12-22 DIAGNOSIS — O114 Pre-existing hypertension with pre-eclampsia, complicating childbirth: Secondary | ICD-10-CM | POA: Diagnosis present

## 2019-12-22 DIAGNOSIS — Z302 Encounter for sterilization: Secondary | ICD-10-CM | POA: Diagnosis not present

## 2019-12-22 DIAGNOSIS — O10913 Unspecified pre-existing hypertension complicating pregnancy, third trimester: Secondary | ICD-10-CM | POA: Diagnosis not present

## 2019-12-22 DIAGNOSIS — Z9851 Tubal ligation status: Secondary | ICD-10-CM

## 2019-12-22 DIAGNOSIS — O09299 Supervision of pregnancy with other poor reproductive or obstetric history, unspecified trimester: Secondary | ICD-10-CM

## 2019-12-22 HISTORY — DX: Pre-existing hypertension with pre-eclampsia, unspecified trimester: O11.9

## 2019-12-22 LAB — CBC WITH DIFFERENTIAL/PLATELET
Abs Immature Granulocytes: 0 10*3/uL (ref 0.00–0.07)
Basophils Absolute: 0 10*3/uL (ref 0.0–0.1)
Basophils Relative: 0 %
Eosinophils Absolute: 0 10*3/uL (ref 0.0–0.5)
Eosinophils Relative: 0 %
HCT: 43.8 % (ref 36.0–46.0)
Hemoglobin: 13.1 g/dL (ref 12.0–15.0)
Lymphocytes Relative: 2 %
Lymphs Abs: 0.2 10*3/uL — ABNORMAL LOW (ref 0.7–4.0)
MCH: 26.2 pg (ref 26.0–34.0)
MCHC: 29.9 g/dL — ABNORMAL LOW (ref 30.0–36.0)
MCV: 87.6 fL (ref 80.0–100.0)
Monocytes Absolute: 0 10*3/uL — ABNORMAL LOW (ref 0.1–1.0)
Monocytes Relative: 0 %
Neutro Abs: 8.3 10*3/uL — ABNORMAL HIGH (ref 1.7–7.7)
Neutrophils Relative %: 98 %
Platelets: 240 10*3/uL (ref 150–400)
RBC: 5 MIL/uL (ref 3.87–5.11)
WBC: 8.5 10*3/uL (ref 4.0–10.5)
nRBC: 0 /100 WBC
nRBC: 0.2 % (ref 0.0–0.2)

## 2019-12-22 LAB — COMPREHENSIVE METABOLIC PANEL
ALT: 18 U/L (ref 0–44)
AST: 25 U/L (ref 15–41)
Albumin: 3.2 g/dL — ABNORMAL LOW (ref 3.5–5.0)
Alkaline Phosphatase: 82 U/L (ref 38–126)
Anion gap: 10 (ref 5–15)
BUN: 12 mg/dL (ref 6–20)
CO2: 21 mmol/L — ABNORMAL LOW (ref 22–32)
Calcium: 9.7 mg/dL (ref 8.9–10.3)
Chloride: 104 mmol/L (ref 98–111)
Creatinine, Ser: 0.77 mg/dL (ref 0.44–1.00)
GFR calc Af Amer: >60 mL/min (ref >60)
GFR calc non Af Amer: >60 mL/min (ref >60)
Glucose, Bld: 110 mg/dL — ABNORMAL HIGH (ref 70–99)
Potassium: 4.6 mmol/L (ref 3.5–5.1)
Sodium: 135 mmol/L (ref 135–145)
Total Bilirubin: 0.5 mg/dL (ref 0.3–1.2)
Total Protein: 7.6 g/dL (ref 6.5–8.1)

## 2019-12-22 LAB — URINALYSIS, ROUTINE W REFLEX MICROSCOPIC
Bilirubin Urine: NEGATIVE
Glucose, UA: NEGATIVE mg/dL
Hgb urine dipstick: NEGATIVE
Ketones, ur: NEGATIVE mg/dL
Leukocytes,Ua: NEGATIVE
Nitrite: NEGATIVE
Protein, ur: 100 mg/dL — AB
Specific Gravity, Urine: 1.02 (ref 1.005–1.030)
pH: 6 (ref 5.0–8.0)

## 2019-12-22 LAB — TYPE AND SCREEN
ABO/RH(D): A POS
Antibody Screen: NEGATIVE

## 2019-12-22 LAB — PROTEIN / CREATININE RATIO, URINE
Creatinine, Urine: 110.55 mg/dL
Protein Creatinine Ratio: 1.67 mg/mg{Cre} — ABNORMAL HIGH (ref 0.00–0.15)
Total Protein, Urine: 185 mg/dL

## 2019-12-22 LAB — ABO/RH: ABO/RH(D): A POS

## 2019-12-22 MED ORDER — CALCIUM CARBONATE ANTACID 500 MG PO CHEW: 2.0000 | CHEWABLE_TABLET | ORAL | Status: DC | PRN

## 2019-12-22 MED ORDER — LABETALOL HCL 100 MG PO TABS: 400.0000 mg | ORAL_TABLET | Freq: Three times a day (TID) | ORAL | Status: DC

## 2019-12-22 MED ORDER — ONDANSETRON 4 MG PO TBDP
4.0000 mg | ORAL_TABLET | Freq: Four times a day (QID) | ORAL | Status: DC | PRN
  Administered 2019-12-25 – 2019-12-26 (×2): 4 mg via ORAL
  Filled 2019-12-22 (×2): qty 1

## 2019-12-22 MED ORDER — LABETALOL HCL 5 MG/ML IV SOLN
80.0000 mg | INTRAVENOUS | Status: DC | PRN
  Administered 2019-12-25: 80 mg via INTRAVENOUS
  Filled 2019-12-22: qty 16

## 2019-12-22 MED ORDER — DOCUSATE SODIUM 100 MG PO CAPS
100.0000 mg | ORAL_CAPSULE | Freq: Every day | ORAL | Status: DC
  Administered 2019-12-25 – 2019-12-27 (×3): 100 mg via ORAL
  Filled 2019-12-22 (×3): qty 1

## 2019-12-22 MED ORDER — LABETALOL HCL 200 MG PO TABS
400.0000 mg | ORAL_TABLET | Freq: Three times a day (TID) | ORAL | Status: DC
  Administered 2019-12-22 – 2019-12-23 (×2): 400 mg via ORAL
  Filled 2019-12-22 (×2): qty 2

## 2019-12-22 MED ORDER — LABETALOL HCL 5 MG/ML IV SOLN
20.0000 mg | INTRAVENOUS | Status: DC | PRN
  Administered 2019-12-25 (×2): 20 mg via INTRAVENOUS
  Filled 2019-12-22 (×2): qty 4

## 2019-12-22 MED ORDER — BETAMETHASONE SOD PHOS & ACET 6 (3-3) MG/ML IJ SUSP
12.0000 mg | Freq: Once | INTRAMUSCULAR | Status: AC
  Administered 2019-12-23: 12 mg via INTRAMUSCULAR
  Filled 2019-12-22: qty 5

## 2019-12-22 MED ORDER — ACETAMINOPHEN 325 MG PO TABS
650.0000 mg | ORAL_TABLET | ORAL | Status: DC | PRN
  Administered 2019-12-24 – 2019-12-26 (×2): 650 mg via ORAL
  Filled 2019-12-22 (×2): qty 2

## 2019-12-22 MED ORDER — LABETALOL HCL 5 MG/ML IV SOLN
40.0000 mg | INTRAVENOUS | Status: DC | PRN
  Administered 2019-12-25: 40 mg via INTRAVENOUS
  Filled 2019-12-22 (×2): qty 8

## 2019-12-22 MED ORDER — PRENATAL MULTIVITAMIN CH
1.0000 | ORAL_TABLET | Freq: Every day | ORAL | Status: DC
  Administered 2019-12-23 – 2019-12-26 (×3): 1 via ORAL
  Filled 2019-12-22 (×3): qty 1

## 2019-12-22 MED ORDER — ZOLPIDEM TARTRATE 5 MG PO TABS: 5.0000 mg | ORAL_TABLET | Freq: Every evening | ORAL | Status: DC | PRN

## 2019-12-22 MED ORDER — ASPIRIN EC 81 MG PO TBEC
81.0000 mg | DELAYED_RELEASE_TABLET | Freq: Every day | ORAL | Status: DC
  Administered 2019-12-23 – 2019-12-27 (×5): 81 mg via ORAL
  Filled 2019-12-22 (×5): qty 1

## 2019-12-22 MED ORDER — BETAMETHASONE SOD PHOS & ACET 6 (3-3) MG/ML IJ SUSP
12.0000 mg | Freq: Once | INTRAMUSCULAR | Status: AC
  Administered 2019-12-22: 12 mg via INTRAMUSCULAR

## 2019-12-22 NOTE — Telephone Encounter (Signed)
I spoke with her again. Her question was regarding work. I advised pt to speak with her job and see what they need from [place] paperwork wise if/when she does become inpatient and cannot work any longer.

## 2019-12-22 NOTE — Procedures (Signed)
Danielle Short 28-Dec-1985 [redacted]w[redacted]d  Fetus A Non-Stress Test Interpretation for 12/22/19  Indication: Chronic Hypertenstion, Unsatisfactory UA Dopplers  Fetal Heart Rate A Mode: External Baseline Rate (A): 140 bpm Variability: Moderate Accelerations: 15 x 15 Decelerations: Variable(X 1)  Uterine Activity Mode: Palpation, Toco Contraction Frequency (min): Occas. UI Contraction Quality: Mild Resting Tone Palpated: Relaxed Resting Time: Adequate  Interpretation (Fetal Testing) Nonstress Test Interpretation: Reactive Comments: EFM tracing reviewed by Dr. Donalee Citrin

## 2019-12-22 NOTE — Telephone Encounter (Signed)
Pt called and would like to know what her next steps are regarding testing results at MFM this morning.

## 2019-12-22 NOTE — MAU Provider Note (Signed)
History     CSN: [id number]  Arrival date and time: 12/22/19 1603   None     Chief Complaint  Patient presents with  . Hypertension   HPI   Danielle Short is a 34 y.o. female [phone] @ [redacted]w[redacted]d here in MAU for admission. Hx of chronic HTN with super-imposed PEC diagnosed on 11/21/2019, severe IUGR and C-section. She was seen by MFM this morning for BPP, NST and dopplers. Dopplers today were abnormal; Amniotic fluid is normal and good fetal activity seen.  Umbilical artery Doppler showed intermittent absent end-diastolic flow.  Antenatal testing is, otherwise, reassuring. NST is reactive.  BPP 10/10. The patient complaints of an on-going HA; frontal that she says is 2/10. Her HA is no different than what it has been in the last few weeks.  She declines the need for medication for treatment at this time. She has good fetal movement and is agreeable for admission now.   OB History    Gravida  4   Para  2   Term  1   Preterm  1   AB  1   Living  1     SAB      TAB  1   Ectopic      Multiple      Live Births  2        Obstetric Comments  Son passed away 05/15/2010        Past Medical History:  Diagnosis Date  . Anemia   . Hypertension during pregnancy  . Pregnancy induced hypertension   . Uterine fibroids affecting pregnancy     Past Surgical History:  Procedure Laterality Date  . CESAREAN SECTION      Family History  Problem Relation Age of Onset  . Diabetes Mother   . Hypertension Mother   . Kidney disease Mother   . Cancer Father     Social History   Tobacco Use  . Smoking status: Former Smoker    Types: Cigars    Quit date: 06/25/2019    Years since quitting: 0.4  . Smokeless tobacco: Never Used  Substance Use Topics  . Alcohol use: No  . Drug use: No    Allergies: No Known Allergies  Medications Prior to Admission  Medication Sig Dispense Refill Last Dose  . aspirin EC 81 MG tablet Take 1 tablet (81 mg total) by mouth daily. Take after 12  weeks for prevention of preeclampsia later in pregnancy 300 tablet 0 12/22/2019 at Unknown time  . Doxylamine-Pyridoxine (DICLEGIS) 10-10 MG TBEC Take 2 tablets by mouth at bedtime. If symptoms persist, add one tablet in the morning and one in the afternoon 100 tablet 5 12/22/2019 at Unknown time  . ferrous sulfate (FERROUSUL) 325 (65 FE) MG tablet Take 1 tablet (325 mg total) by mouth 2 (two) times daily. 60 tablet 1 12/22/2019 at Unknown time  . labetalol (NORMODYNE) 200 MG tablet Take 2 tablets (400 mg total) by mouth 3 (three) times daily. 60 tablet 3 12/22/2019 at Unknown time  . Prenatal MV-Min-FA-Omega-3 (PRENATAL GUMMIES/DHA & FA) 0.4-32.5 MG CHEW Chew 3 tablets by mouth daily. 90 tablet 12 12/22/2019 at Unknown time  . Blood Pressure Monitoring (BLOOD PRESSURE CUFF) MISC 1 Device by Does not apply route once a week. 1 each 0   . Elastic Bandages & Supports (COMFORT FIT MATERNITY SUPP LG) MISC 1 Units by Does not apply route daily. 1 each 0   . ondansetron (ZOFRAN ODT) 4 MG disintegrating  tablet Take 1 tablet (4 mg total) by mouth every 6 (six) hours as needed for nausea. (Patient not taking: Reported on 12/18/2019) 20 tablet 1    No results found for this or any previous visit (from the past 48 hour(s)).  Review of Systems  Eyes: Negative for visual disturbance.  Gastrointestinal: Negative for abdominal pain.  Neurological: Positive for headaches.   Physical Exam   Blood pressure (!) 142/95, pulse 73, temperature 98.3 F (36.8 C), resp. rate 18, last menstrual period 05/08/2019.   Patient Vitals for the past 24 hrs:  BP Temp Pulse Resp  12/22/19 1645 (!) 142/95 -- 73 --  12/22/19 1641 (!) 144/99 98.3 F (36.8 C) 83 18    Physical Exam  Constitutional: She is oriented to person, place, and time. She appears well-developed and well-nourished. No distress.  HENT:  Head: Normocephalic.  Eyes: Pupils are equal, round, and reactive to light.  Musculoskeletal:        General: Normal range of  motion.     Cervical back: Neck supple.  Neurological: She is alert and oriented to person, place, and time. She has normal reflexes.  Negative clonus.   Skin: Skin is warm. She is not diaphoretic.  Psychiatric: Her behavior is normal.   Fetal Tracing: Baseline: 125 bpm Variability: Moderate  Accelerations: 15x15 Decelerations: None Toco: None  MAU Course  Procedures  None  MDM  PIH labs collected in MAU Patient declines tylenol for her HA; frontal 2/10. Discussed patient with Dr. Roselie Awkward, discussed MFM recommendations for admission to ante with likely delivery at 34 weeks BMZ dose # 2 tomorrow.   Assessment and Plan   A:  1. Pre-eclampsia added to pre-existing hypertension   2. Chronic hypertension with superimposed preeclampsia   3. [redacted] weeks gestation of pregnancy     P:  Admit to Ante  BMZ tomorrow Patient aware and agreeable with the plan of care. [place] labs pending   Lyric Hoar, Artist Pais, NP 12/22/2019 5:35 PM

## 2019-12-22 NOTE — H&P (Signed)
[name] is a 34 y.o. female [phone] @ [redacted]w[redacted]d here in MAU for admission. Hx of chronic HTN with super-imposed PEC diagnosed on 11/21/2019, severe IUGR and C-section. She was seen by MFM this morning for BPP, NST and dopplers. Dopplers today were abnormal; Amniotic fluid is normal and good fetal activity seen.  Umbilical artery Doppler showed intermittent absent end-diastolic flow.  Antenatal testing is, otherwise, reassuring. NST is reactive.  BPP 10/10. The patient complaints of an on-going HA; frontal that she says is 2/10. Her HA is no different than what it has been in the last few weeks.  She declines the need for medication for treatment at this time. She has good fetal movement and is agreeable for admission now.   OB History    Gravida  4   Para  2   Term  1   Preterm  1   AB  1   Living  1     SAB      TAB  1   Ectopic      Multiple      Live Births  2        Obstetric Comments  Son passed away 05-15-2010       Past Medical History:  Diagnosis Date  . Anemia   . Hypertension during pregnancy  . Pregnancy induced hypertension   . Uterine fibroids affecting pregnancy    Past Surgical History:  Procedure Laterality Date  . CESAREAN SECTION     Family History: family history includes Cancer in her father; Diabetes in her mother; Hypertension in her mother; Kidney disease in her mother. Social History:  reports that she quit smoking about 5 months ago. Her smoking use included cigars. She has never used smokeless tobacco. She reports that she does not drink alcohol or use drugs.   OB History    Gravida  4   Para  2   Term  1   Preterm  1   AB  1   Living  1     SAB      TAB  1   Ectopic      Multiple      Live Births  2        Obstetric Comments  Son passed away 05-15-10        Past Medical History:  Diagnosis Date  . Anemia   . Hypertension during pregnancy  . Pregnancy induced hypertension   . Uterine fibroids affecting  pregnancy     Past Surgical History:  Procedure Laterality Date  . CESAREAN SECTION      Family History  Problem Relation Age of Onset  . Diabetes Mother   . Hypertension Mother   . Kidney disease Mother   . Cancer Father     Social History   Tobacco Use  . Smoking status: Former Smoker    Types: Cigars    Quit date: 06/25/2019    Years since quitting: 0.4  . Smokeless tobacco: Never Used  Substance Use Topics  . Alcohol use: No  . Drug use: No    Allergies: No Known Allergies  Medications Prior to Admission  Medication Sig Dispense Refill Last Dose  . aspirin EC 81 MG tablet Take 1 tablet (81 mg total) by mouth daily. Take after 12 weeks for prevention of preeclampsia later in pregnancy 300 tablet 0 12/22/2019 at Unknown time  . Doxylamine-Pyridoxine (DICLEGIS) 10-10 MG TBEC Take 2 tablets by mouth at bedtime. If symptoms persist,  add one tablet in the morning and one in the afternoon 100 tablet 5 12/22/2019 at Unknown time  . ferrous sulfate (FERROUSUL) 325 (65 FE) MG tablet Take 1 tablet (325 mg total) by mouth 2 (two) times daily. 60 tablet 1 12/22/2019 at Unknown time  . labetalol (NORMODYNE) 200 MG tablet Take 2 tablets (400 mg total) by mouth 3 (three) times daily. 60 tablet 3 12/22/2019 at Unknown time  . Prenatal MV-Min-FA-Omega-3 (PRENATAL GUMMIES/DHA & FA) 0.4-32.5 MG CHEW Chew 3 tablets by mouth daily. 90 tablet 12 12/22/2019 at Unknown time  . Blood Pressure Monitoring (BLOOD PRESSURE CUFF) MISC 1 Device by Does not apply route once a week. 1 each 0   . Elastic Bandages & Supports (COMFORT FIT MATERNITY SUPP LG) MISC 1 Units by Does not apply route daily. 1 each 0   . ondansetron (ZOFRAN ODT) 4 MG disintegrating tablet Take 1 tablet (4 mg total) by mouth every 6 (six) hours as needed for nausea. (Patient not taking: Reported on 12/18/2019) 20 tablet 1    No results found for this or any previous visit (from the past 48 hour(s)).  Review of Systems  Eyes: Negative for  visual disturbance.  Gastrointestinal: Negative for abdominal pain.  Neurological: Positive for headaches.   Physical Exam   Blood pressure (!) 142/95, pulse 73, temperature 98.3 F (36.8 C), resp. rate 18, last menstrual period 05/08/2019.   Patient Vitals for the past 24 hrs:  BP Temp Pulse Resp  12/22/19 1645 (!) 142/95 - 73 -  12/22/19 1641 (!) 144/99 98.3 F (36.8 C) 83 18    Physical Exam  Constitutional: She is oriented to person, place, and time. She appears well-developed and well-nourished. No distress.  HENT:  Head: Normocephalic.  Eyes: Pupils are equal, round, and reactive to light.  Musculoskeletal:        General: Normal range of motion.     Cervical back: Neck supple.  Neurological: She is alert and oriented to person, place, and time. She has normal reflexes.  Negative clonus.   Skin: Skin is warm. She is not diaphoretic.  Psychiatric: Her behavior is normal.   Fetal Tracing: Baseline: 125 bpm Variability: Moderate  Accelerations: 15x15 Decelerations: None Toco: None  MAU Course  Procedures  None  MDM  PIH labs collected in MAU Patient declines tylenol for her HA; frontal 2/10. Discussed patient with Dr. Roselie Awkward, discussed MFM recommendations for admission to ante with likely delivery at 34 weeks BMZ dose # 2 tomorrow.   Assessment and Plan   A:  1. Pre-eclampsia added to pre-existing hypertension   2. Chronic hypertension with superimposed preeclampsia   3. [redacted] weeks gestation of pregnancy     P:  Admit to Ante  BMZ tomorrow Patient aware and agreeable with the plan of care. [place] labs pending   Suhaas Agena, Artist Pais, NP 12/22/2019 5:35 PM

## 2019-12-22 NOTE — MAU Note (Signed)
Pt sent from MFM for elevated b/p and decreased doppler flow. Pt reports slight headache but denies any visual changes or abd pain. Good fetal movement felt.

## 2019-12-22 NOTE — Consult Note (Signed)
Maternal-Fetal Medicine  Name: Danielle Short  MRN: [id number] Referring Provider: Vivien Rota, MD  Patient with chronic hypertension return for fetal growth assessment and antenatal testing.  She takes labetalol 400 mg 3 times a day.  Doses of labetalol was increased at her prenatal visit last week.  Patient reports she noted systolic blood pressures over 160 mmHg at home last week.  Her blood pressure prenatal visit on 12/18/2019 was 143/93 mmHg.  Today her blood pressures are 138/94 and 152/97 mmHg.  Patient has a diagnosis of chronic hypertension with superimposed preeclampsia.  Labs (last week) including platelets and liver enzymes are within normal range.  Urine protein creatinine ratio is increased (0.93).  Patient does not have symptoms of severe headache or right upper quadrant pain or vaginal bleeding.  She reports occasional "floaters" in her vision.  Obstetric history significant for 2 previous cesarean deliveries.   On today's ultrasound, the estimated fetal weight is at the 6 percentile.  Interval weight gain is 504 g.  Head circumference measurement is at between -2 1-1 SD (no evidence of microcephaly).  Amniotic fluid is normal and good fetal activity seen.  Umbilical artery Doppler showed intermittent absent end-diastolic flow.  Antenatal testing is, otherwise, reassuring. NST is reactive.  BPP 10/10.  I discussed the following with the patient: Fetal growth restriction I explained ultrasound findings suggestive of fetal growth restriction.  I discussed the significance of umbilical artery Doppler study that showed severe fetal growth restriction.  Most likely cause is chronic hypertension with superimposed preeclampsia.  I counseled her on the importance of frequent antenatal testing and delivery decisions will be based on abnormal Doppler studies.  If persistent or intermittent absent end-diastolic flow is seen, we would recommend delivery at [redacted] weeks gestation.  I discussed the  benefit of antenatal corticosteroids for fetal pulmonary maturity.  Patient agreed to take betamethasone.  She received first dose of betamethasone today and will be returning tomorrow for her second dose.  Chronic hypertension with superimposed preeclampsia I discussed possible adverse maternal outcomes from uncontrolled blood pressure and preeclampsia.  I discussed symptoms of severe features of preeclampsia.  I offered her admission for management till, possibly, delivery. I discussed the importance of good blood pressure control to prevent adverse fetal and maternal outcomes.  Recommendations: -BPP and UA Doppler studies on Monday/Thursday. -Betamethasone second dose tomorrow. -Patient will discuss with the family and decide on inpatient management. -She will be checking blood pressures at home at least twice daily and understands the parameters.  Consultation including face-to-face counseling: 30 minutes.

## 2019-12-23 ENCOUNTER — Ambulatory Visit (HOSPITAL_COMMUNITY): Payer: Medicaid Other | Attending: Obstetrics and Gynecology

## 2019-12-23 ENCOUNTER — Encounter (HOSPITAL_COMMUNITY): Payer: Self-pay

## 2019-12-23 LAB — SARS CORONAVIRUS 2 (TAT 6-24 HRS): SARS Coronavirus 2: NEGATIVE

## 2019-12-23 MED ORDER — LABETALOL HCL 200 MG PO TABS
400.0000 mg | ORAL_TABLET | Freq: Once | ORAL | Status: AC
  Administered 2019-12-23: 400 mg via ORAL
  Filled 2019-12-23: qty 2

## 2019-12-23 MED ORDER — LABETALOL HCL 200 MG PO TABS
800.0000 mg | ORAL_TABLET | Freq: Two times a day (BID) | ORAL | Status: DC
  Administered 2019-12-23 – 2019-12-24 (×3): 800 mg via ORAL
  Filled 2019-12-23 (×3): qty 4

## 2019-12-23 NOTE — Progress Notes (Addendum)
[place]) NOTE  Danielle Short is a 34 y.o. [id number] at [redacted]w[redacted]d by LMP who is admitted for [facility], SI preeclampsia and IUGR.   Fetal presentation is cephalic. Length of Stay:  1  Days  Subjective:  Patient reports the fetal movement as active. Patient reports uterine contraction  activity as rare. Patient reports  vaginal bleeding as none. Patient describes fluid per vagina as None.  Vitals:  Blood pressure (!) 154/100, pulse 86, temperature 98.3 F (36.8 C), temperature source Oral, resp. rate 19, height 5' 4.5" (1.638 m), weight 90.4 kg, last menstrual period 05/08/2019, SpO2 100 %. Physical Examination:  General appearance - alert, well appearing, and in no distress Heart - normal rate and regular rhythm Abdomen - soft, nontender, nondistended Fundal Height:  size equals dates Cervical Exam: Not evaluated. Extremities: extremities normal, atraumatic, no cyanosis or edema and Homans sign is negative, no sign of DVT with DTRs 2+ bilaterally Membranes:intact  Fetal Monitoring:  Baseline: 145 bpm, Variability: Good {> 6 bpm), Accelerations: Reactive and Decelerations: Absent  Labs:  Results for orders placed or performed during the hospital encounter of 12/22/19 (from the past 24 hour(s))  Protein / creatinine ratio, urine   Collection Time: 12/22/19  4:48 PM  Result Value Ref Range   Creatinine, Urine 110.55 mg/dL   Total Protein, Urine 185 mg/dL   Protein Creatinine Ratio 1.67 (H) 0.00 - 0.15 mg/mg[Cre]  Urinalysis, Routine w reflex microscopic   Collection Time: 12/22/19  5:04 PM  Result Value Ref Range   Color, Urine YELLOW YELLOW   APPearance CLEAR CLEAR   Specific Gravity, Urine 1.020 1.005 - 1.030   pH 6.0 5.0 - 8.0   Glucose, UA NEGATIVE NEGATIVE mg/dL   Hgb urine dipstick NEGATIVE NEGATIVE   Bilirubin Urine NEGATIVE NEGATIVE   Ketones, ur NEGATIVE NEGATIVE mg/dL   Protein, ur 100 (A) NEGATIVE mg/dL   Nitrite NEGATIVE NEGATIVE    Leukocytes,Ua NEGATIVE NEGATIVE   RBC / HPF 0-5 0 - 5 RBC/hpf   WBC, UA 0-5 0 - 5 WBC/hpf   Bacteria, UA RARE (A) NONE SEEN   Squamous Epithelial / LPF 6-10 0 - 5   Mucus PRESENT   CBC with Differential/Platelet   Collection Time: 12/22/19  5:23 PM  Result Value Ref Range   WBC 8.5 4.0 - 10.5 K/uL   RBC 5.00 3.87 - 5.11 MIL/uL   Hemoglobin 13.1 12.0 - 15.0 g/dL   HCT 43.8 36.0 - 46.0 %   MCV 87.6 80.0 - 100.0 fL   MCH 26.2 26.0 - 34.0 pg   MCHC 29.9 (L) 30.0 - 36.0 g/dL   RDW Not Measured 11.5 - 15.5 %   Platelets 240 150 - 400 K/uL   nRBC 0.2 0.0 - 0.2 %   Neutrophils Relative % 98 %   Neutro Abs 8.3 (H) 1.7 - 7.7 K/uL   Lymphocytes Relative 2 %   Lymphs Abs 0.2 (L) 0.7 - 4.0 K/uL   Monocytes Relative 0 %   Monocytes Absolute 0.0 (L) 0.1 - 1.0 K/uL   Eosinophils Relative 0 %   Eosinophils Absolute 0.0 0.0 - 0.5 K/uL   Basophils Relative 0 %   Basophils Absolute 0.0 0.0 - 0.1 K/uL   nRBC 0 0 /100 WBC   Abs Immature Granulocytes 0.00 0.00 - 0.07 K/uL   Polychromasia PRESENT   Comprehensive metabolic panel   Collection Time: 12/22/19  5:23 PM  Result Value Ref Range   Sodium 135 135 - 145  mmol/L   Potassium 4.6 3.5 - 5.1 mmol/L   Chloride 104 98 - 111 mmol/L   CO2 21 (L) 22 - 32 mmol/L   Glucose, Bld 110 (H) 70 - 99 mg/dL   BUN 12 6 - 20 mg/dL   Creatinine, Ser 0.77 0.44 - 1.00 mg/dL   Calcium 9.7 8.9 - 10.3 mg/dL   Total Protein 7.6 6.5 - 8.1 g/dL   Albumin 3.2 (L) 3.5 - 5.0 g/dL   AST 25 15 - 41 U/L   ALT 18 0 - 44 U/L   Alkaline Phosphatase 82 38 - 126 U/L   Total Bilirubin 0.5 0.3 - 1.2 mg/dL   GFR calc non Af Amer >60 >60 mL/min   GFR calc Af Amer >60 >60 mL/min   Anion gap 10 5 - 15  ABO/Rh   Collection Time: 12/22/19  5:24 PM  Result Value Ref Range   ABO/RH(D)      A POS Performed at [facility] [address], [place], [place] [address]   Type and screen [place]   Collection Time: 12/22/19  5:35 PM  Result Value Ref  Range   ABO/RH(D) A POS    Antibody Screen NEG    Sample Expiration      12/25/2019,2359 Performed at [facility], [place] [address] [place], [place] [address]   SARS CORONAVIRUS 2 (TAT 6-24 HRS) Nasopharyngeal Nasopharyngeal Swab   Collection Time: 12/22/19  5:46 PM   Specimen: Nasopharyngeal Swab  Result Value Ref Range   SARS Coronavirus 2 NEGATIVE NEGATIVE     Medications:  Scheduled . aspirin EC  81 mg Oral Daily  . betamethasone acetate-betamethasone sodium phosphate  12 mg Intramuscular Once  . docusate sodium  100 mg Oral Daily  . labetalol  400 mg Oral Q8H  . prenatal multivitamin  1 tablet Oral Q1200   I have reviewed the patient's current medications.  ASSESSMENT: Patient Active Problem List   Diagnosis Date Noted  . Chronic hypertension with superimposed preeclampsia 12/22/2019  . Pregnancy affected by fetal growth restriction   . Pre-eclampsia added to pre-existing hypertension 12/10/2019  . Unwanted fertility 11/26/2019  . H/O pre-eclampsia in prior pregnancy, currently pregnant 08/20/2019  . Iron deficiency anemia during pregnancy 07/24/2019  . Chronic hypertension affecting pregnancy 07/23/2019  . 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 Hx of cesarean section complicating pregnancy [id number]  . Supervision of high risk pregnancy, antepartum 07/08/2019    PLAN: BPP and UA Doppler studies on Monday/Thursday.  -Betamethasone second dose today Labetalol 800 mg Q12 Emeterio Reeve 12/23/2019,7:15 AM

## 2019-12-24 DIAGNOSIS — Z3A32 32 weeks gestation of pregnancy: Secondary | ICD-10-CM

## 2019-12-24 DIAGNOSIS — O365930 Maternal care for other known or suspected poor fetal growth, third trimester, not applicable or unspecified: Secondary | ICD-10-CM

## 2019-12-24 DIAGNOSIS — O113 Pre-existing hypertension with pre-eclampsia, third trimester: Secondary | ICD-10-CM

## 2019-12-24 LAB — CULTURE, BETA STREP (GROUP B ONLY)

## 2019-12-24 MED ORDER — LACTATED RINGERS IV SOLN: INTRAVENOUS | Status: DC

## 2019-12-24 MED ORDER — LACTATED RINGERS IV BOLUS
500.0000 mL | Freq: Once | INTRAVENOUS | Status: AC
  Administered 2019-12-24: 500 mL via INTRAVENOUS

## 2019-12-24 NOTE — Progress Notes (Signed)
Patient ID: Danielle Short, female   DOB: Jun 09, 1986, 34 y.o.   MRN: [id number] [place] ANTEPARTUM(COMPREHENSIVE) NOTE  Danielle Short is a 34 y.o. [id number] at [redacted]w[redacted]d by LMP who is admitted for [facility], SI preeclampsia and IUGR.   Fetal presentation is cephalic. Length of Stay:  2  Days  Subjective: Patient reports feeling well and is without complaints. She denies HA, visual changes, RUQ/epigastric pain Patient reports the fetal movement as active. Patient reports uterine contraction  activity as rare. Patient reports  vaginal bleeding as none. Patient describes fluid per vagina as None.  Vitals:  Blood pressure (!) 141/87, pulse 74, temperature 99.5 F (37.5 C), temperature source Oral, resp. rate 16, height 5' 4.5" (1.638 m), weight 90.4 kg, last menstrual period 05/08/2019, SpO2 95 %. Physical Examination:  General appearance - alert, well appearing, and in no distress Heart - normal rate and regular rhythm Abdomen - soft, nontender, nondistended Fundal Height:  size equals dates Cervical Exam: Not evaluated. Extremities: extremities normal, atraumatic, no cyanosis or edema and Homans sign is negative, no sign of DVT with DTRs 2+ bilaterally Membranes:intact  Fetal Monitoring:  Baseline: 140 bpm, Variability: Good {> 6 bpm), Accelerations: Reactive and Decelerations: Absent  Labs:  No results found for this or any previous visit (from the past 24 hour(s)).   Medications:  Scheduled . aspirin EC  81 mg Oral Daily  . docusate sodium  100 mg Oral Daily  . labetalol  800 mg Oral Q12H  . prenatal multivitamin  1 tablet Oral Q1200   I have reviewed the patient's current medications.  ASSESSMENT: Patient Active Problem List   Diagnosis Date Noted  . Chronic hypertension with superimposed preeclampsia 12/22/2019  . Pregnancy affected by fetal growth restriction   . Pre-eclampsia added to pre-existing hypertension 12/10/2019  . Unwanted fertility 11/26/2019  . H/O  pre-eclampsia in prior pregnancy, currently pregnant 08/20/2019  . Iron deficiency anemia during pregnancy 07/24/2019  . Chronic hypertension affecting pregnancy 07/23/2019  . 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 Hx of cesarean section complicating pregnancy [id number]  . Supervision of high risk pregnancy, antepartum 07/08/2019    PLAN: BPP and UA Doppler studies on Monday/Thursday.  - Betamethasone course completed today  - BP better controlled on labetalol 800 BID  - Delivery for worsening maternal fetal/status or by 34 weeks  - Patient with previous c-section x 2 and will be scheduled for a repeat c-section  - Continue antepartum care  Danielle Short 12/24/2019,9:13 AM

## 2019-12-25 ENCOUNTER — Inpatient Hospital Stay (HOSPITAL_COMMUNITY): Payer: Medicaid Other

## 2019-12-25 ENCOUNTER — Ambulatory Visit (HOSPITAL_COMMUNITY): Payer: Medicaid Other

## 2019-12-25 DIAGNOSIS — O10913 Unspecified pre-existing hypertension complicating pregnancy, third trimester: Secondary | ICD-10-CM

## 2019-12-25 DIAGNOSIS — Z3A33 33 weeks gestation of pregnancy: Secondary | ICD-10-CM

## 2019-12-25 DIAGNOSIS — O34219 Maternal care for unspecified type scar from previous cesarean delivery: Secondary | ICD-10-CM

## 2019-12-25 DIAGNOSIS — O113 Pre-existing hypertension with pre-eclampsia, third trimester: Secondary | ICD-10-CM

## 2019-12-25 DIAGNOSIS — O365930 Maternal care for other known or suspected poor fetal growth, third trimester, not applicable or unspecified: Secondary | ICD-10-CM

## 2019-12-25 DIAGNOSIS — Z364 Encounter for antenatal screening for fetal growth retardation: Secondary | ICD-10-CM

## 2019-12-25 MED ORDER — LABETALOL HCL 200 MG PO TABS
800.0000 mg | ORAL_TABLET | Freq: Three times a day (TID) | ORAL | Status: DC
  Administered 2019-12-25 – 2019-12-29 (×12): 800 mg via ORAL
  Filled 2019-12-25 (×13): qty 4

## 2019-12-25 MED ORDER — HYDRALAZINE HCL 20 MG/ML IJ SOLN
5.0000 mg | INTRAMUSCULAR | Status: AC
  Administered 2019-12-25: 5 mg via INTRAVENOUS
  Filled 2019-12-25: qty 1

## 2019-12-25 NOTE — Progress Notes (Signed)
Patient ID: Danielle Short, female   DOB: [birth year], 34 y.o.   MRN: [id number] [place] ANTEPARTUM(COMPREHENSIVE) NOTE  Chyrel Larocque is a 34 y.o. [id number] at [redacted]w[redacted]d by LMP who is admitted for [facility], SI preeclampsia and IUGR.   Fetal presentation is cephalic. Length of Stay:  3  Days  Subjective: Patient reports feeling well and is without complaints. She denies HA, visual changes, RUQ/epigastric pain Patient reports the fetal movement as active. Patient reports uterine contraction  activity as rare. Patient reports  vaginal bleeding as none. Patient describes fluid per vagina as None.  Vitals:  Blood pressure (!) 156/104, pulse 64, temperature 98.3 F (36.8 C), temperature source Oral, resp. rate 18, height 5' 4.5" (1.638 m), weight 90.4 kg, last menstrual period 05/08/2019, SpO2 100 %. Physical Examination:  General appearance - alert, well appearing, and in no distress Heart - normal rate and regular rhythm Abdomen - soft, nontender, nondistended Fundal Height:  size equals dates Cervical Exam: Not evaluated. Extremities: extremities normal, atraumatic, no cyanosis or edema and Homans sign is negative, no sign of DVT with DTRs 2+ bilaterally Membranes:intact  Fetal Monitoring:  Baseline: 140 bpm, Variability: Good {> 6 bpm), Accelerations: Reactive and Decelerations: variable decels and 2 prolonged decels in the past 12 hours with spontaneous recovery    Labs:  No results found for this or any previous visit (from the past 24 hour(s)).   Medications:  Scheduled . aspirin EC  81 mg Oral Daily  . docusate sodium  100 mg Oral Daily  . labetalol  800 mg Oral Q8H  . prenatal multivitamin  1 tablet Oral Q1200   I have reviewed the patient's current medications.  ASSESSMENT: Patient Active Problem List   Diagnosis Date Noted  . Chronic hypertension with superimposed preeclampsia 12/22/2019  . Pregnancy affected by fetal growth restriction   . Pre-eclampsia added to  pre-existing hypertension 12/10/2019  . Unwanted fertility 11/26/2019  . H/O pre-eclampsia in prior pregnancy, currently pregnant 08/20/2019  . Iron deficiency anemia during pregnancy 07/24/2019  . Chronic hypertension affecting pregnancy 07/23/2019  . 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 Hx of cesarean section complicating pregnancy [id number]  . Supervision of high risk pregnancy, antepartum 07/08/2019    PLAN: BPP and UA Doppler today.  - Betamethasone course completed today  - Patient received IV antihypertensive overnight to control severe range BP. Will increase labetalol to 800 TID  - FHT with prolonged decels now recovered. Will continue close monitoring for now but keep NPO   - Delivery for worsening maternal fetal/status or by 34 weeks  - Patient with previous c-section x 2 and will be scheduled for a repeat c-section  - Continue antepartum care  Julianne Chamberlin 12/25/2019,10:18 AM

## 2019-12-26 LAB — TYPE AND SCREEN
ABO/RH(D): A POS
Antibody Screen: NEGATIVE

## 2019-12-26 MED ORDER — SODIUM CHLORIDE 0.9% FLUSH
3.0000 mL | Freq: Two times a day (BID) | INTRAVENOUS | Status: DC
  Administered 2019-12-26: 3 mL via INTRAVENOUS

## 2019-12-26 MED ORDER — NIFEDIPINE ER OSMOTIC RELEASE 30 MG PO TB24
30.0000 mg | ORAL_TABLET | Freq: Two times a day (BID) | ORAL | Status: DC
  Administered 2019-12-26 – 2019-12-27 (×3): 30 mg via ORAL
  Filled 2019-12-26 (×3): qty 1

## 2019-12-26 NOTE — Progress Notes (Signed)
Patient's BP 190/107 at 2234, recheck 181/113 at 2249. Patient recieved 20mg  IV labetalol at 2253. RN rechecked BP 10 minutes later at 2301 and BP was 154/104. Will continue to monitor.

## 2019-12-26 NOTE — Progress Notes (Signed)
Patient ID: Danielle Short, female   DOB: 06-May-1986, 34 y.o.   MRN: [id number] [place] ANTEPARTUM(COMPREHENSIVE) NOTE  Danielle Short is a 34 y.o. [id number] at [redacted]w[redacted]d by LMP who is admitted for [facility], SI severe preeclampsia,  IUGR  and abnormal dopplers.   Fetal presentation is cephalic.  Length of Stay:  4  Days  Subjective: Patient is without complaints. She denies HA, visual changes, RUQ/epigastric pain Patient reports the fetal movement as active. Patient reports uterine contraction  activity as rare. Patient reports  vaginal bleeding as none. Patient describes fluid per vagina as none.  Vitals:  Blood pressure (!) 141/103, pulse 70, temperature 98.3 F (36.8 C), temperature source Oral, resp. rate 18, height 5' 4.5" (1.638 m), weight 90.4 kg, last menstrual period 05/08/2019, SpO2 99 %. Physical Examination:  General appearance - alert, well appearing, and in no distress Heart - normal rate and regular rhythm Abdomen - soft, nontender, nondistended Fundal Height:  size equals dates Cervical Exam: Not evaluated. Extremities: extremities normal, atraumatic, no cyanosis or edema and Homans sign is negative, no sign of DVT with DTRs 2+ bilaterally Membranes:intact  Fetal Monitoring:  Baseline: 140 bpm, Variability: Good {> 6 bpm), Accelerations: Reactive and Decelerations: variable decels   Labs:  No results found for this or any previous visit (from the past 24 hour(s)).  Imaging: [place] MFM FETAL BPP WO NON STRESS  Result Date: 12/25/2019 ----------------------------------------------------------------------  OBSTETRICS REPORT                       (Signed Final 12/25/2019 10:53 am) ---------------------------------------------------------------------- Patient Info  ID #:       [id number]                          D.O.B.:  1986-04-06 (33 yrs)  Name:       Danielle Short                  Visit Date: 12/25/2019 09:28 am ----------------------------------------------------------------------  Performed By  Performed By:     Novella Rob        Ref. Address:     [address]  [place]  Attending:        Tama High MD        Secondary Phy.:   [facility] MAU/Triage  Referred By:      [facility]             [facility]         [facility] ---------------------------------------------------------------------- Orders   #  Description                          Code         Ordered By   1  [place] MFM UA CORD DOPPLER               76820.02     [place]   2  [place] MFM FETAL BPP WO NON              76819.01     RAVI [facility]      STRESS  ----------------------------------------------------------------------   #  Order #                    Accession #                 Episode #   1  [id number]                  [id number]   2  [id number]                  [id number]                  [id number]  ---------------------------------------------------------------------- Indications   Hypertension - Chronic with superimposed       O11.9 O10.919   preeclampsia   Maternal care for known or suspected poor      O36.5930   fetal growth, third trimester, not applicable or   unspecified IUGR   Previous cesarean delivery, antepartum x 2     O34.219   Poor obstetric history: Previous preterm       O09.219   delivery, antepartum (27 weeks)   Poor obstetric history: Previous               O09.299   preeclampsia / eclampsia/gestational HTN   Uterine fibroids affecting pregnancy in third  O34.13, D25.9   trimester, antepartum   [redacted] weeks gestation of pregnancy                Z3A.33  ---------------------------------------------------------------------- Vital  Signs  Weight (lb): 199                               Height:        5'4"  BMI:         34.15 ---------------------------------------------------------------------- Fetal Evaluation  Num Of Fetuses:         1  Fetal Heart Rate(bpm):  148  Cardiac Activity:       Observed  Presentation:  Cephalic  Placenta:               Anterior  Amniotic Fluid  AFI FV:      Within normal limits  AFI Sum(cm)     %Tile       Largest Pocket(cm)  9.95            17          4.5  RUQ(cm)       RLQ(cm)       LUQ(cm)        LLQ(cm)  3.4           2.05          4.5            0 ---------------------------------------------------------------------- Biophysical Evaluation  Amniotic F.V:   Within normal limits       F. Tone:        Observed  F. Movement:    Observed                   Score:          6/8  F. Breathing:   Not Observed ---------------------------------------------------------------------- OB History  Gravidity:    4         Term:   1        Prem:   1        SAB:   0  TOP:          1       Ectopic:  0        Living: 1 ---------------------------------------------------------------------- Gestational Age  LMP:           33w 0d        Date:  05/08/19                 EDD:   02/12/20  Best:          33w 0d     Det. By:  LMP  (05/08/19)          EDD:   02/12/20 ---------------------------------------------------------------------- Anatomy  Heart:                 Appears normal         Stomach:                Appears normal, left                         (4CH, axis, and                                sided                         situs)  RVOT:                  Appears normal         Kidneys:                Appear normal  LVOT:                  Appears normal         Bladder:                Appears normal  Diaphragm:             Appears normal ----------------------------------------------------------------------  Doppler - Fetal Vessels  Umbilical Artery   S/D     %tile                                            ADFV    RDFV   7.58    > 97.5                                              Yes      No ---------------------------------------------------------------------- Cervix Uterus Adnexa  Cervix  Not visualized (advanced GA >24wks) ---------------------------------------------------------------------- Myomas   Site                     L(cm)      W(cm)      D(cm)      Location   Anterior                 4.8        4.4        5.4   Right                    5.9        5.8        6.3   Fundus                   5.3        3.9        5.4  ----------------------------------------------------------------------   Blood Flow                 RI        PI       Comments  ---------------------------------------------------------------------- Impression  Patient was admitted with diagnosis of superimposed  preeclampsia.  She completed a course of antenatal corticosteroids.  Amniotic fluid is normal and good fetal activity is seen. Fetal  breathing movements did not meet the criteria (BPP).  Umbilical artery (UA) Doppler showed intermittent absent end-  diastolic flow. BPP 6/8.  I reviewed the NST, which is reactive.  Cephalic presentation. ---------------------------------------------------------------------- Recommendations  -BPP and UA Doppler on Mon-Thu.  -If NST is nonreactive, repeat BPP.  -Because of absent end-diastolic flow on UA Doppler, I  recommend delivery at 34 weeks. ----------------------------------------------------------------------                  Tama High, MD Electronically Signed Final Report   12/25/2019 10:53 am ----------------------------------------------------------------------  [place] MFM UA CORD DOPPLER  Result Date: 12/25/2019 ----------------------------------------------------------------------  OBSTETRICS REPORT                       (Signed Final 12/25/2019 10:53 am) ---------------------------------------------------------------------- Patient Info  ID #:       [id number]                          D.O.B.:  07-Nov-1986  (33 yrs)  Name:       Danielle Short                  Visit Date: 12/25/2019 09:28 am ---------------------------------------------------------------------- Performed By  Performed By:     Novella Rob        Ref. Address:  [address]                                                             [place]  Attending:        Tama High MD        Secondary Phy.:   [facility] MAU/Triage  Referred By:      [place]             Location[facility]         [facility] ---------------------------------------------------------------------- Orders   #  Description                          Code         Ordered By   1  [place] MFM UA CORD DOPPLER               76820.02     [place]   2  [place] MFM FETAL BPP WO NON              76819.01     RAVI [facility]      STRESS  ----------------------------------------------------------------------   #  Order #                    Accession #                 Episode #   1  [id number]                  [id number]   2  [id number]                  [id number]  ---------------------------------------------------------------------- Indications   Hypertension - Chronic with superimposed       O11.9 O10.919   preeclampsia   Maternal care for known or suspected poor      O36.5930   fetal growth, third trimester, not applicable or   unspecified IUGR   Previous cesarean delivery, antepartum x 2     O34.219   Poor obstetric history: Previous preterm       O09.219   delivery, antepartum (27 weeks)   Poor obstetric history: Previous               O09.299   preeclampsia / eclampsia/gestational HTN   Uterine fibroids affecting pregnancy in third  O34.13, D25.9    trimester, antepartum   [redacted] weeks gestation of pregnancy                Z3A.33  ---------------------------------------------------------------------- Vital Signs  Weight (lb): 199                               Height:        5'4"  BMI:         34.15 ---------------------------------------------------------------------- Fetal Evaluation  Num Of Fetuses:         1  Fetal Heart Rate(bpm):  148  Cardiac Activity:       Observed  Presentation:           Cephalic  Placenta:               Anterior  Amniotic Fluid  AFI FV:      Within normal limits  AFI Sum(cm)     %Tile       Largest Pocket(cm)  9.95            17          4.5  RUQ(cm)       RLQ(cm)       LUQ(cm)        LLQ(cm)  3.4           2.05          4.5            0 ---------------------------------------------------------------------- Biophysical Evaluation  Amniotic F.V:   Within normal limits       F. Tone:        Observed  F. Movement:    Observed                   Score:          6/8  F. Breathing:   Not Observed ---------------------------------------------------------------------- OB History  Gravidity:    4         Term:   1        Prem:   1        SAB:   0  TOP:          1       Ectopic:  0        Living: 1 ---------------------------------------------------------------------- Gestational Age  LMP:           33w 0d        Date:  05/08/19                 EDD:   02/12/20  Best:  33w 0d     Det. By:  LMP  (05/08/19)          EDD:   02/12/20 ---------------------------------------------------------------------- Anatomy  Heart:                 Appears normal         Stomach:                Appears normal, left                         (4CH, axis, and                                sided                         situs)  RVOT:                  Appears normal         Kidneys:                Appear normal  LVOT:                  Appears normal         Bladder:                Appears normal  Diaphragm:             Appears normal  ---------------------------------------------------------------------- Doppler - Fetal Vessels  Umbilical Artery   S/D     %tile                                            ADFV    RDFV  7.58    > 97.5                                              Yes      No ---------------------------------------------------------------------- Cervix Uterus Adnexa  Cervix  Not visualized (advanced GA >24wks) ---------------------------------------------------------------------- Myomas   Site                     L(cm)      W(cm)      D(cm)      Location   Anterior                 4.8        4.4        5.4   Right                    5.9        5.8        6.3   Fundus                   5.3        3.9        5.4  ----------------------------------------------------------------------   Blood Flow                 RI        PI       Comments  ----------------------------------------------------------------------  Impression  Patient was admitted with diagnosis of superimposed  preeclampsia.  She completed a course of antenatal corticosteroids.  Amniotic fluid is normal and good fetal activity is seen. Fetal  breathing movements did not meet the criteria (BPP).  Umbilical artery (UA) Doppler showed intermittent absent end-  diastolic flow. BPP 6/8.  I reviewed the NST, which is reactive.  Cephalic presentation. ---------------------------------------------------------------------- Recommendations  -BPP and UA Doppler on Mon-Thu.  -If NST is nonreactive, repeat BPP.  -Because of absent end-diastolic flow on UA Doppler, I  recommend delivery at 34 weeks. ----------------------------------------------------------------------                  Tama High, MD Electronically Signed Final Report   12/25/2019 10:53 am ----------------------------------------------------------------------    Medications:  Scheduled . aspirin EC  81 mg Oral Daily  . docusate sodium  100 mg Oral Daily  . labetalol  800 mg Oral Q8H  . NIFEdipine  30 mg Oral BID   . prenatal multivitamin  1 tablet Oral Q1200   I have reviewed the patient's current medications.  ASSESSMENT: Patient Active Problem List   Diagnosis Date Noted  . Chronic hypertension with superimposed preeclampsia 12/22/2019  . Pregnancy affected by fetal growth restriction   . Pre-eclampsia added to pre-existing hypertension 12/10/2019  . Unwanted fertility 11/26/2019  . H/O pre-eclampsia in prior pregnancy, currently pregnant 08/20/2019  . Iron deficiency anemia during pregnancy 07/24/2019  . Chronic hypertension affecting pregnancy 07/23/2019  . 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 Hx of cesarean section complicating pregnancy [id number]  . Supervision of high risk pregnancy, antepartum 07/08/2019    PLAN:  - BPP and UA Doppler stable yesterday, continue checking Mon/Thurs  - Betamethasone course completed yesterday  - Patient had severe range BP, continue labetalol 800 TID and added Procardia XL 30 mg po bid  - Will continue close monitoring of FHR tracing  - Delivery for worsening maternal fetal/status or by 34 weeks  - Patient with previous c-section x 2 and will be scheduled for a repeat c-section  - Continue antepartum care   Verita Schneiders, MD 12/26/2019,11:07 AM

## 2019-12-26 NOTE — Plan of Care (Signed)
  Problem: Education: Goal: Knowledge of disease or condition will improve Outcome: Progressing   Problem: Education: Goal: Knowledge of disease or condition will improve Outcome: Progressing   

## 2019-12-27 ENCOUNTER — Inpatient Hospital Stay (HOSPITAL_COMMUNITY): Payer: Medicaid Other | Admitting: Anesthesiology

## 2019-12-27 ENCOUNTER — Encounter (HOSPITAL_COMMUNITY): Payer: Self-pay | Admitting: Obstetrics & Gynecology

## 2019-12-27 ENCOUNTER — Encounter (HOSPITAL_COMMUNITY)
Admission: AD | Disposition: A | Discharge status: Home / Self Care | Payer: Self-pay | Source: Home / Self Care | Attending: Obstetrics & Gynecology

## 2019-12-27 DIAGNOSIS — O114 Pre-existing hypertension with pre-eclampsia, complicating childbirth: Secondary | ICD-10-CM

## 2019-12-27 DIAGNOSIS — O1002 Pre-existing essential hypertension complicating childbirth: Secondary | ICD-10-CM

## 2019-12-27 DIAGNOSIS — Z3A33 33 weeks gestation of pregnancy: Secondary | ICD-10-CM

## 2019-12-27 DIAGNOSIS — O34211 Maternal care for low transverse scar from previous cesarean delivery: Secondary | ICD-10-CM

## 2019-12-27 DIAGNOSIS — Z98891 History of uterine scar from previous surgery: Secondary | ICD-10-CM

## 2019-12-27 LAB — CBC
HCT: 41.9 % (ref 36.0–46.0)
Hemoglobin: 12.8 g/dL (ref 12.0–15.0)
MCH: 26.8 pg (ref 26.0–34.0)
MCHC: 30.5 g/dL (ref 30.0–36.0)
MCV: 87.8 fL (ref 80.0–100.0)
Platelets: 211 10*3/uL (ref 150–400)
RBC: 4.77 MIL/uL (ref 3.87–5.11)
WBC: 7.3 10*3/uL (ref 4.0–10.5)
nRBC: 0 % (ref 0.0–0.2)

## 2019-12-27 LAB — COMPREHENSIVE METABOLIC PANEL
ALT: 16 U/L (ref 0–44)
AST: 17 U/L (ref 15–41)
Albumin: 3 g/dL — ABNORMAL LOW (ref 3.5–5.0)
Alkaline Phosphatase: 79 U/L (ref 38–126)
Anion gap: 10 (ref 5–15)
BUN: 9 mg/dL (ref 6–20)
CO2: 24 mmol/L (ref 22–32)
Calcium: 9.4 mg/dL (ref 8.9–10.3)
Chloride: 103 mmol/L (ref 98–111)
Creatinine, Ser: 0.78 mg/dL (ref 0.44–1.00)
GFR calc Af Amer: >60 mL/min (ref >60)
GFR calc non Af Amer: >60 mL/min (ref >60)
Glucose, Bld: 83 mg/dL (ref 70–99)
Potassium: 3.9 mmol/L (ref 3.5–5.1)
Sodium: 137 mmol/L (ref 135–145)
Total Bilirubin: 0.5 mg/dL (ref 0.3–1.2)
Total Protein: 6.5 g/dL (ref 6.5–8.1)

## 2019-12-27 SURGERY — Surgical Case
Anesthesia: Spinal

## 2019-12-27 MED ORDER — OXYCODONE HCL 5 MG/5ML PO SOLN: 5.0000 mg | Freq: Once | ORAL | Status: DC | PRN

## 2019-12-27 MED ORDER — MEPERIDINE HCL 25 MG/ML IJ SOLN: 6.2500 mg | INTRAMUSCULAR | Status: DC | PRN

## 2019-12-27 MED ORDER — KETOROLAC TROMETHAMINE 30 MG/ML IJ SOLN
30.0000 mg | Freq: Four times a day (QID) | INTRAMUSCULAR | Status: DC
  Administered 2019-12-27 – 2019-12-28 (×3): 30 mg via INTRAVENOUS
  Filled 2019-12-27 (×3): qty 1

## 2019-12-27 MED ORDER — LABETALOL HCL 5 MG/ML IV SOLN: 80.0000 mg | INTRAVENOUS | Status: DC | PRN

## 2019-12-27 MED ORDER — PHENYLEPHRINE HCL-NACL 20-0.9 MG/250ML-% IV SOLN
INTRAVENOUS | Status: AC
  Filled 2019-12-27: qty 250

## 2019-12-27 MED ORDER — MAGNESIUM SULFATE BOLUS VIA INFUSION
4.0000 g | Freq: Once | INTRAVENOUS | Status: AC
  Administered 2019-12-27: 4 g via INTRAVENOUS
  Filled 2019-12-27: qty 1000

## 2019-12-27 MED ORDER — PRENATAL MULTIVITAMIN CH
1.0000 | ORAL_TABLET | Freq: Every day | ORAL | Status: DC
  Administered 2019-12-28 – 2019-12-29 (×2): 1 via ORAL
  Filled 2019-12-27 (×3): qty 1

## 2019-12-27 MED ORDER — PHENYLEPHRINE HCL-NACL 20-0.9 MG/250ML-% IV SOLN
INTRAVENOUS | Status: DC | PRN
  Administered 2019-12-27: 20 ug/min via INTRAVENOUS

## 2019-12-27 MED ORDER — SIMETHICONE 80 MG PO CHEW
80.0000 mg | CHEWABLE_TABLET | Freq: Three times a day (TID) | ORAL | Status: DC
  Administered 2019-12-27 – 2019-12-28 (×4): 80 mg via ORAL
  Filled 2019-12-27 (×5): qty 1

## 2019-12-27 MED ORDER — SOD CITRATE-CITRIC ACID 500-334 MG/5ML PO SOLN
30.0000 mL | ORAL | Status: AC
  Administered 2019-12-27: 30 mL via ORAL
  Filled 2019-12-27: qty 30

## 2019-12-27 MED ORDER — SENNOSIDES-DOCUSATE SODIUM 8.6-50 MG PO TABS: 2.0000 | ORAL_TABLET | Freq: Every evening | ORAL | Status: DC | PRN

## 2019-12-27 MED ORDER — LACTATED RINGERS IV SOLN: INTRAVENOUS | Status: AC

## 2019-12-27 MED ORDER — CEFAZOLIN SODIUM-DEXTROSE 2-4 GM/100ML-% IV SOLN
INTRAVENOUS | Status: AC
  Filled 2019-12-27: qty 100

## 2019-12-27 MED ORDER — CEFAZOLIN SODIUM-DEXTROSE 2-4 GM/100ML-% IV SOLN: 2.0000 g | INTRAVENOUS | Status: DC

## 2019-12-27 MED ORDER — SIMETHICONE 80 MG PO CHEW
80.0000 mg | CHEWABLE_TABLET | ORAL | Status: DC
  Filled 2019-12-27: qty 1

## 2019-12-27 MED ORDER — FENTANYL CITRATE (PF) 100 MCG/2ML IJ SOLN: 25.0000 ug | INTRAMUSCULAR | Status: DC | PRN

## 2019-12-27 MED ORDER — SIMETHICONE 80 MG PO CHEW: 80.0000 mg | CHEWABLE_TABLET | ORAL | Status: DC | PRN

## 2019-12-27 MED ORDER — ONDANSETRON HCL 4 MG/2ML IJ SOLN
INTRAMUSCULAR | Status: DC | PRN
  Administered 2019-12-27: 4 mg via INTRAVENOUS

## 2019-12-27 MED ORDER — DIBUCAINE (PERIANAL) 1 % EX OINT
1.0000 "application " | TOPICAL_OINTMENT | CUTANEOUS | Status: DC | PRN
  Filled 2019-12-27: qty 28

## 2019-12-27 MED ORDER — IBUPROFEN 800 MG PO TABS
800.0000 mg | ORAL_TABLET | Freq: Three times a day (TID) | ORAL | Status: DC
  Filled 2019-12-27: qty 1

## 2019-12-27 MED ORDER — OXYTOCIN 40 UNITS IN NORMAL SALINE INFUSION - SIMPLE MED
2.5000 [IU]/h | INTRAVENOUS | Status: AC
  Administered 2019-12-27: 2.5 [IU]/h via INTRAVENOUS

## 2019-12-27 MED ORDER — COCONUT OIL OIL: 1.0000 "application " | TOPICAL_OIL | Status: DC | PRN

## 2019-12-27 MED ORDER — ACETAMINOPHEN 325 MG PO TABS: 325.0000 mg | ORAL_TABLET | ORAL | Status: DC | PRN

## 2019-12-27 MED ORDER — OXYTOCIN 40 UNITS IN NORMAL SALINE INFUSION - SIMPLE MED
INTRAVENOUS | Status: DC | PRN
  Administered 2019-12-27: 40 [IU] via INTRAVENOUS

## 2019-12-27 MED ORDER — ENOXAPARIN SODIUM 40 MG/0.4ML ~~LOC~~ SOLN
40.0000 mg | SUBCUTANEOUS | Status: DC
  Administered 2019-12-28 – 2019-12-29 (×2): 40 mg via SUBCUTANEOUS
  Filled 2019-12-27 (×2): qty 0.4

## 2019-12-27 MED ORDER — ONDANSETRON HCL 4 MG/2ML IJ SOLN
INTRAMUSCULAR | Status: AC
  Filled 2019-12-27: qty 2

## 2019-12-27 MED ORDER — LABETALOL HCL 5 MG/ML IV SOLN: 20.0000 mg | INTRAVENOUS | Status: DC | PRN

## 2019-12-27 MED ORDER — FENTANYL CITRATE (PF) 100 MCG/2ML IJ SOLN
INTRAMUSCULAR | Status: AC
  Filled 2019-12-27: qty 2

## 2019-12-27 MED ORDER — HYDRALAZINE HCL 10 MG PO TABS: 10.0000 mg | ORAL_TABLET | Freq: Three times a day (TID) | ORAL | Status: DC | PRN

## 2019-12-27 MED ORDER — BUPIVACAINE IN DEXTROSE 0.75-8.25 % IT SOLN
INTRATHECAL | Status: DC | PRN
  Administered 2019-12-27: 1.5 mL via INTRATHECAL

## 2019-12-27 MED ORDER — HYDROCODONE-ACETAMINOPHEN 5-325 MG PO TABS
1.0000 | ORAL_TABLET | ORAL | Status: DC | PRN
  Administered 2019-12-29 (×3): 2 via ORAL
  Administered 2019-12-30: 1 via ORAL
  Filled 2019-12-27 (×4): qty 2
  Filled 2019-12-27: qty 1

## 2019-12-27 MED ORDER — LACTATED RINGERS IV SOLN: INTRAVENOUS | Status: DC | PRN

## 2019-12-27 MED ORDER — CEFAZOLIN SODIUM-DEXTROSE 2-3 GM-%(50ML) IV SOLR
INTRAVENOUS | Status: DC | PRN
  Administered 2019-12-27: 2 g via INTRAVENOUS

## 2019-12-27 MED ORDER — MORPHINE SULFATE (PF) 0.5 MG/ML IJ SOLN
INTRAMUSCULAR | Status: AC
  Filled 2019-12-27: qty 10

## 2019-12-27 MED ORDER — PROMETHAZINE HCL 25 MG/ML IJ SOLN
12.5000 mg | Freq: Four times a day (QID) | INTRAMUSCULAR | Status: DC | PRN
  Administered 2019-12-27: 12.5 mg via INTRAVENOUS
  Filled 2019-12-27: qty 1

## 2019-12-27 MED ORDER — OXYTOCIN 40 UNITS IN NORMAL SALINE INFUSION - SIMPLE MED
INTRAVENOUS | Status: AC
  Filled 2019-12-27: qty 1000

## 2019-12-27 MED ORDER — WITCH HAZEL-GLYCERIN EX PADS: 1.0000 "application " | MEDICATED_PAD | CUTANEOUS | Status: DC | PRN

## 2019-12-27 MED ORDER — MAGNESIUM SULFATE 40 GM/1000ML IV SOLN
2.0000 g/h | INTRAVENOUS | Status: DC
  Filled 2019-12-27: qty 1000

## 2019-12-27 MED ORDER — MENTHOL 3 MG MT LOZG: 1.0000 | LOZENGE | OROMUCOSAL | Status: DC | PRN

## 2019-12-27 MED ORDER — DIPHENHYDRAMINE HCL 25 MG PO CAPS: 25.0000 mg | ORAL_CAPSULE | Freq: Four times a day (QID) | ORAL | Status: DC | PRN

## 2019-12-27 MED ORDER — SENNOSIDES-DOCUSATE SODIUM 8.6-50 MG PO TABS: 2.0000 | ORAL_TABLET | ORAL | Status: DC

## 2019-12-27 MED ORDER — ONDANSETRON HCL 4 MG/2ML IJ SOLN
4.0000 mg | Freq: Once | INTRAMUSCULAR | Status: AC | PRN
  Administered 2019-12-27: 4 mg via INTRAVENOUS

## 2019-12-27 MED ORDER — SODIUM CHLORIDE 0.9 % IR SOLN
Status: DC | PRN
  Administered 2019-12-27: 1000 mL

## 2019-12-27 MED ORDER — OXYCODONE HCL 5 MG PO TABS: 5.0000 mg | ORAL_TABLET | Freq: Once | ORAL | Status: DC | PRN

## 2019-12-27 MED ORDER — LABETALOL HCL 5 MG/ML IV SOLN: 40.0000 mg | INTRAVENOUS | Status: DC | PRN

## 2019-12-27 MED ORDER — MAGNESIUM SULFATE 40 GM/1000ML IV SOLN
2.0000 g/h | INTRAVENOUS | Status: AC
  Administered 2019-12-27 – 2019-12-28 (×2): 2 g/h via INTRAVENOUS
  Filled 2019-12-27: qty 1000

## 2019-12-27 MED ORDER — PHENYLEPHRINE HCL (PRESSORS) 10 MG/ML IV SOLN
INTRAVENOUS | Status: DC | PRN
  Administered 2019-12-27: 80 ug via INTRAVENOUS
  Administered 2019-12-27: 40 ug via INTRAVENOUS

## 2019-12-27 MED ORDER — MORPHINE SULFATE (PF) 0.5 MG/ML IJ SOLN
INTRAMUSCULAR | Status: DC | PRN
  Administered 2019-12-27: .15 mg via INTRATHECAL

## 2019-12-27 MED ORDER — FENTANYL CITRATE (PF) 100 MCG/2ML IJ SOLN
INTRAMUSCULAR | Status: DC | PRN
  Administered 2019-12-27: 15 ug via INTRATHECAL

## 2019-12-27 MED ORDER — POLYETHYLENE GLYCOL 3350 17 G PO PACK
17.0000 g | PACK | Freq: Every day | ORAL | Status: DC
  Administered 2019-12-28: 17 g via ORAL
  Filled 2019-12-27: qty 1

## 2019-12-27 MED ORDER — FENTANYL CITRATE (PF) 100 MCG/2ML IJ SOLN
INTRAMUSCULAR | Status: DC | PRN
  Administered 2019-12-27: 50 ug via INTRAVENOUS

## 2019-12-27 MED ORDER — ACETAMINOPHEN 160 MG/5ML PO SOLN: 325.0000 mg | ORAL | Status: DC | PRN

## 2019-12-27 SURGICAL SUPPLY — 43 items
APL SKNCLS STERI-STRIP NONHPOA (GAUZE/BANDAGES/DRESSINGS) ×1
BENZOIN TINCTURE PRP APPL 2/3 (GAUZE/BANDAGES/DRESSINGS) ×3 IMPLANT
CANISTER SUCT 3000ML PPV (MISCELLANEOUS) ×3 IMPLANT
CHLORAPREP W/TINT 26ML (MISCELLANEOUS) ×3 IMPLANT
CLIP FILSHIE TUBAL LIGA STRL (Clip) ×2 IMPLANT
CLOSURE STERI STRIP 1/2 X4 (GAUZE/BANDAGES/DRESSINGS) ×1 IMPLANT
CLOSURE WOUND 1/2 X4 (GAUZE/BANDAGES/DRESSINGS) ×1
DRSG OPSITE POSTOP 4X10 (GAUZE/BANDAGES/DRESSINGS) ×3 IMPLANT
ELECT REM PT RETURN 9FT ADLT (ELECTROSURGICAL) ×3
ELECTRODE REM PT RTRN 9FT ADLT (ELECTROSURGICAL) ×1 IMPLANT
EXTRACTOR VACUUM KIWI (MISCELLANEOUS) ×3 IMPLANT
GAUZE SPONGE 4X4 12PLY STRL LF (GAUZE/BANDAGES/DRESSINGS) ×4 IMPLANT
GLOVE BIOGEL PI IND STRL 7.0 (GLOVE) ×2 IMPLANT
GLOVE BIOGEL PI INDICATOR 7.0 (GLOVE) ×4
GLOVE INDICATOR 7.5 STRL GRN (GLOVE) ×3 IMPLANT
GLOVE SKINSENSE NS SZ7.0 (GLOVE) ×2
GLOVE SKINSENSE STRL SZ7.0 (GLOVE) ×1 IMPLANT
GOWN STRL REUS W/ TWL LRG LVL3 (GOWN DISPOSABLE) ×2 IMPLANT
GOWN STRL REUS W/ TWL XL LVL3 (GOWN DISPOSABLE) ×1 IMPLANT
GOWN STRL REUS W/TWL LRG LVL3 (GOWN DISPOSABLE) ×6
GOWN STRL REUS W/TWL XL LVL3 (GOWN DISPOSABLE) ×3
NS IRRIG 1000ML POUR BTL (IV SOLUTION) ×3 IMPLANT
PACK C SECTION WH (CUSTOM PROCEDURE TRAY) ×3 IMPLANT
PAD ABD 7.5X8 STRL (GAUZE/BANDAGES/DRESSINGS) ×3 IMPLANT
PAD OB MATERNITY 4.3X12.25 (PERSONAL CARE ITEMS) ×3 IMPLANT
PAD PREP 24X48 CUFFED NSTRL (MISCELLANEOUS) ×3 IMPLANT
PENCIL SMOKE EVAC W/HOLSTER (ELECTROSURGICAL) ×3 IMPLANT
RTRCTR C-SECT PINK 25CM LRG (MISCELLANEOUS) ×2 IMPLANT
STRIP CLOSURE SKIN 1/2X4 (GAUZE/BANDAGES/DRESSINGS) ×2 IMPLANT
SUT MNCRL 0 VIOLET CTX 36 (SUTURE) ×2 IMPLANT
SUT MON AB 4-0 PS1 27 (SUTURE) ×3 IMPLANT
SUT MONOCRYL 0 CTX 36 (SUTURE) ×4
SUT PLAIN 0 NONE (SUTURE) IMPLANT
SUT PLAIN 2 0 XLH (SUTURE) ×3 IMPLANT
SUT VIC AB 0 CT1 27 (SUTURE) ×6
SUT VIC AB 0 CT1 27XBRD ANBCTR (SUTURE) ×2 IMPLANT
SUT VIC AB 3-0 CT1 27 (SUTURE) ×3
SUT VIC AB 3-0 CT1 TAPERPNT 27 (SUTURE) ×1 IMPLANT
SUT VIC AB 4-0 KS 27 (SUTURE) ×2 IMPLANT
SUT VICRYL 2 0 18  TIES (SUTURE)
SUT VICRYL 2 0 18 TIES (SUTURE) IMPLANT
TOWEL OR 17X24 6PK STRL BLUE (TOWEL DISPOSABLE) ×6 IMPLANT
WATER STERILE IRR 1000ML POUR (IV SOLUTION) ×3 IMPLANT

## 2019-12-27 NOTE — Progress Notes (Signed)
OB Note  Patient with persistent low grade HA, no other s/s of pre-eclampsia.  Patient Vitals for the past 24 hrs:  BP Temp Temp src Pulse Resp SpO2  12/27/19 0830 -- -- -- -- -- 99 %  12/27/19 0829 (!) 159/109 98.4 F (36.9 C) Oral 72 20 --  12/27/19 0514 (!) 154/90 98.1 F (36.7 C) Oral 72 18 100 %  12/26/19 2008 (!) 138/94 98.6 F (37 C) Oral 78 18 100 %  12/26/19 1722 (!) 153/104 98.6 F (37 C) Oral 65 18 100 %  12/26/19 1307 (!) 152/95 98.6 F (37 C) Oral 73 18 99 %  12/26/19 1305 (!) 150/97 -- -- 82 -- --   NAD CTAB Normal s1 and s2, no MRGs 3+ brachial  A/P: pt stable D/w her that given persistent HA and maxed out labetalol and near max on procardia with severe FGR that I recommend moving towards delivery today, which she is amenable to. D/w her and she desires BTL r/b/a d/w her. Will start Mg and can proceed to OR when labs are back.   Durene Romans MD Attending [facility] for [facility] [employer] [facility] (Faculty Practice) 12/27/2019 Time: 0900

## 2019-12-27 NOTE — Op Note (Signed)
Operative Note   SURGERY DATE: 12/27/2019  PRE-OP DIAGNOSIS:  *Pregnancy at [redacted]w[redacted]d * Chronic hypertension with superimposed pre-eclampsia with severe features * History of two prior cesareans * Undesired fertility  POST-OP DIAGNOSIS: The same   PROCEDURE: Repeat low transverse cesarean section via pfannenstiel skin incision with double layer uterine closure; bilateral tubal sterilization with Filshie clips  SURGEON: Surgeon(s) and Role:    Aletha Halim, MD - Primary  ASSISTANT: Clarnce Flock MD/MPH - OB Fellow  ANESTHESIA: spinal  ESTIMATED BLOOD LOSS: 235 cc  DRAINS: 100 mL UOP via indwelling foley  TOTAL IV FLUIDS: 800 mL crystalloid  VTE PROPHYLAXIS: SCDs to bilateral lower extremities  ANTIBIOTICS: Two grams of Cefazolin were given., within 1 hour of skin incision  SPECIMENS: Placenta to pathology  COMPLICATIONS: None  INDICATIONS: Danielle Short is a 34 year old [id number] at [redacted]w[redacted]d who was admitted to antepartum service on 12/22/2019 with cHTN and superimposed pre-eclampsia with severe features, IUGR, and abnormal dopplers. She was given a course of betamethasone. On day of surgery patient was at nearly max doses of anti-hypertensives with ongoing elevated blood pressures as well as persistent headache, and patient was taken for repeat cesarean due to the above indications.   FINDINGS: Significant intra-abdominal adhesions were noted. Multi-fibroid uterus, normal tubes and ovaries. Clear, scant amniotic fluid, cephalic female infant, weight 1520 gm, APGARs 9/9, intact placenta.  PROCEDURE IN DETAIL: The patient was taken to the operating room where anesthesia was administered and normal fetal heart tones were confirmed. She was then prepped and draped in the normal fashion in the dorsal supine position with a leftward tilt.  After a time out was performed, a pfannensteil skin incision was made with the scalpel and carried through to the underlying layer of fascia. The fascia  was then incised at the midline and this incision was extended laterally with the mayo scissors. Attention was turned to the superior aspect of the fascial incision which was grasped with the kocher clamps x 2, tented up and the rectus muscles were dissected off with the scalpel. In a similar fashion the inferior aspect of the fascial incision was grasped with the kocher clamps, tented up and the rectus muscles dissected off with the mayo scissors. The rectus muscles were then separated in the midline and the peritoneum was entered bluntly. At this point significant adhesions were encountered that were lysed with bovie. Once adequate visualization had been achieved the Alexis retraction was inserted.   A low transverse hysterotomy was made with the scalpel until the endometrial cavity was breached and the amniotic sac ruptured with the Allis clamp, yielding scant clear amniotic fluid. This incision was extended bluntly and the infant's head, shoulders and body were delivered atraumatically.The cord was clamped x 2 and cut, and the infant was handed to the awaiting pediatricians, after delayed cord clamping was done.  The placenta was then gradually expressed from the uterus and then the uterus was cleared of all clots and debris. The hysterotomy was repaired with a running suture of 0 Monocryl. A second imbricating layer of 0 Monocryl suture was then placed with excellent hemostasis.   The left Fallopian tube was then identified by tracing out to the fimbraie, grasped with the Babcock clamps. An avascular midsection of the tube approximately 3-4cm from the cornua was grasped with the babcock clamps and the filshie clip was applied, taking care to incorporate the entire tube.  Attention was then turned to the right fallopian tube after confirmation by  tracing the tube out to the fimbriae. The same procedure was then performed on the right Fallopian tube, with excellent hemostasis was noted from both BTL  sites.  The Alexis retraction was then removed, and the hysterotomy and all operative sites were reinspected and excellent hemostasis was noted after irrigation and suction of the abdomen with warm saline.  The fascia was reapproximated with 0 Vicryl in a simple running fashion bilaterally. The subcutaneous layer was then reapproximated with interrupted sutures of 2-0 plain gut, and the skin was then closed with 4-0 Vicryl, in a subcuticular fashion.  The patient  tolerated the procedure well. Sponge, lap, needle, and instrument counts were correct x 2. The patient was transferred to the recovery room awake, alert and breathing independently in stable condition.  Augustin Coupe, [place] for [employer] Fish farm manager)

## 2019-12-27 NOTE — Discharge Summary (Signed)
Postpartum Discharge Summary     Patient Name: Danielle Short DOB: 1986-01-31 MRN: [id number]  Date of admission: 12/22/2019 Delivering Provider: Aletha Halim   Date of discharge: 12/30/2019  Admitting diagnosis: Chronic hypertension with superimposed preeclampsia [O11.9] Previous cesarean section [Z98.891] Intrauterine pregnancy: [redacted]w[redacted]d    Secondary diagnosis:  Active Problems:   Supervision of high risk pregnancy, antepartum   Chronic hypertension affecting pregnancy   Hx of cesarean section complicating pregnancy   H/O pre-eclampsia in prior pregnancy, currently pregnant   H/O tubal ligation   Pregnancy affected by fetal growth restriction   Chronic hypertension with superimposed preeclampsia   Previous cesarean section  Additional problems:None     Discharge diagnosis: Preterm Pregnancy Delivered and CHTN with superimposed preeclampsia                                                                                                Post partum procedures:postpartum tubal ligation with Filshie clips  Augmentation:  n/a  Complications: None  Hospital course:  Sceduled C/S   34y.o. yo [id number] 33w2das admitted to the hospital 12/22/2019 for scheduled cesarean section with the following indication: MoLillar Biancas a [age]ear old G3P1111 at 3232w2do was admitted to antepartum service on 12/22/2019 with cHTN and superimposed pre-eclampsia with severe features, IUGR, and abnormal dopplers. She was given a course of betamethasone. On day of surgery patient was at nearly max doses of anti-hypertensives with ongoing elevated blood pressures as well as persistent headache, and patient was taken for repeat cesarean due to severe pre-eclampsia.  Membrane Rupture Time/Date: 11:20 AM ,12/27/2019   Patient delivered a Viable infant.12/27/2019  Details of operation can be found in separate operative note.  Pateint had an uncomplicated postpartum course.  She is ambulating, tolerating a regular  diet, passing flatus, and urinating well. BP was controlled on Labetalol, she did not want this changed to a once a a day medication (this was tried on POD#2-3) Patient is discharged home in stable condition on  12/31/19        Delivery time: 11:21 AM   Magnesium Sulfate received: Yes BMZ received: Yes Rhophylac:N/A MMR:N/A Transfusion:No  Physical exam  Vitals:   12/30/19 0531 12/30/19 0810 12/30/19 0907 12/30/19 1208  BP: (!) 155/100 (!) 153/100 (!) 150/88 (!) 138/94  Pulse: 68 72 97 81  Resp:  18  18  Temp:  98.4 F (36.9 C)  99 F (37.2 C)  TempSrc:  Oral  Oral  SpO2:  100%  98%  Weight:      Height:       General: alert, cooperative and no distress Lochia: appropriate Uterine Fundus: firm Incision: Healing well with no significant drainage, No significant erythema, Dressing is clean, dry, and intact DVT Evaluation: No evidence of DVT seen on physical exam. Negative Homan's sign. No cords or calf tenderness. Labs: Lab Results  Component Value Date   WBC 8.3 12/28/2019   HGB 11.5 (L) 12/28/2019   HCT 37.7 12/28/2019   MCV 89.8 12/28/2019   PLT 192 12/28/2019   CMP Latest Ref Rng &  Units 12/28/2019  Glucose 70 - 99 mg/dL 90  BUN 6 - 20 mg/dL 17  Creatinine 0.44 - 1.00 mg/dL 0.75  Sodium 135 - 145 mmol/L 135  Potassium 3.5 - 5.1 mmol/L 4.3  Chloride 98 - 111 mmol/L 101  CO2 22 - 32 mmol/L 25  Calcium 8.9 - 10.3 mg/dL 8.1(L)  Total Protein 6.5 - 8.1 g/dL 5.9(L)  Total Bilirubin 0.3 - 1.2 mg/dL 0.3  Alkaline Phos 38 - 126 U/L 72  AST 15 - 41 U/L 32  ALT 0 - 44 U/L 16    Discharge instruction: per After Visit Summary and "Baby and Me Booklet".  After visit meds:  Allergies as of 12/30/2019   No Known Allergies      Medication List     STOP taking these medications    aspirin EC 81 MG tablet   Comfort Fit Maternity Supp Lg Misc   Doxylamine-Pyridoxine 10-10 MG Tbec Commonly known as: Diclegis   ferrous sulfate 325 (65 FE) MG tablet Commonly known  as: FerrouSul   ondansetron 4 MG disintegrating tablet Commonly known as: Zofran ODT       TAKE these medications    Blood Pressure Cuff Misc 1 Device by Does not apply route once a week.   HYDROcodone-acetaminophen 5-325 MG tablet Commonly known as: NORCO/VICODIN Take 1 tablet by mouth every 4 (four) hours as needed for moderate pain or severe pain.   ibuprofen 800 MG tablet Commonly known as: ADVIL Take 1 tablet (800 mg total) by mouth every 8 (eight) hours as needed.   labetalol 200 MG tablet Commonly known as: NORMODYNE Take 4 tablets (800 mg total) by mouth 3 (three) times daily. What changed: how much to take   Prenatal Gummies/DHA & FA 0.4-32.5 MG Chew Chew 3 tablets by mouth daily.   senna-docusate 8.6-50 MG tablet Commonly known as: Senokot-S Take 1 tablet by mouth 2 (two) times daily as needed for mild constipation.        Diet: routine diet  Activity: Advance as tolerated. Pelvic rest for 6 weeks.   Outpatient follow up:4 weeks Follow up Appt: Future Appointments  Date Time Provider [place]  01/02/2020 10:30 AM [place] None  01/09/2020 11:00 AM [place] None  01/23/2020  9:45 AM Constant, Peggy, MD [facility] None    Newborn Data: Live born female  Birth Weight: 3 lb 5.6 oz (1520 g) APGAR: 9, 9  Newborn Delivery   Birth date/time: 12/27/2019 11:21:00 Delivery type: C-Section, Low Transverse Trial of labor: No C-section categorization: Repeat      Baby Feeding: Breast Disposition:NICU   12/31/2019 Verita Schneiders, MD

## 2019-12-27 NOTE — Anesthesia Postprocedure Evaluation (Signed)
Anesthesia Post Note  Patient: Danielle Short  Procedure(s) Performed: CESAREAN SECTION WITH BILATERAL TUBAL LIGATION (N/A )     Patient location during evaluation: PACU Anesthesia Type: Spinal Level of consciousness: oriented and awake and alert Pain management: pain level controlled Vital Signs Assessment: post-procedure vital signs reviewed and stable Respiratory status: spontaneous breathing, respiratory function stable and patient connected to nasal cannula oxygen Cardiovascular status: blood pressure returned to baseline and stable Postop Assessment: no headache, no backache and no apparent nausea or vomiting Anesthetic complications: no    Last Vitals:  Vitals:   12/27/19 1501 12/27/19 1700  BP: 114/83 126/84  Pulse: 67 90  Resp: 16 16  Temp: (!) 36.4 C   SpO2:  98%    Last Pain:  Vitals:   12/27/19 1501  TempSrc: Oral  PainSc:                  Danielle Short

## 2019-12-27 NOTE — Plan of Care (Signed)
  Problem: Education: °Goal: Knowledge of disease or condition will improve °Outcome: Completed/Met °Goal: Knowledge of the prescribed therapeutic regimen will improve °Outcome: Completed/Met °Goal: Individualized Educational Video(s) °Outcome: Completed/Met °  °Problem: Clinical Measurements: °Goal: Complications related to the disease process, condition or treatment will be avoided or minimized °Outcome: Completed/Met °  °

## 2019-12-27 NOTE — Transfer of Care (Signed)
Immediate Anesthesia Transfer of Care Note  Patient: Danielle Short  Procedure(s) Performed: CESAREAN SECTION WITH BILATERAL TUBAL LIGATION (N/A )  Patient Location: PACU  Anesthesia Type:Spinal  Level of Consciousness: awake and alert   Airway & Oxygen Therapy: Patient Spontanous Breathing  Post-op Assessment: Report given to RN  Post vital signs: Reviewed  Last Vitals:  Vitals Value Taken Time  BP 125/93 12/27/19 1219  Temp    Pulse 62 12/27/19 1223  Resp 12 12/27/19 1223  SpO2 100 % 12/27/19 1223  Vitals shown include unvalidated device data.  Last Pain:  Vitals:   12/27/19 0958  TempSrc: Oral  PainSc:       Patients Stated Pain Goal: 3 ([id number])  Complications: No apparent anesthesia complications

## 2019-12-27 NOTE — Anesthesia Postprocedure Evaluation (Deleted)
Anesthesia Post Note  Patient: Danielle Short  Procedure(s) Performed: CESAREAN SECTION WITH BILATERAL TUBAL LIGATION (N/A )     Patient location during evaluation: Mother Baby Anesthesia Type: Epidural Level of consciousness: awake and alert, oriented and patient cooperative Pain management: pain level controlled Vital Signs Assessment: post-procedure vital signs reviewed and stable Respiratory status: spontaneous breathing Cardiovascular status: stable Postop Assessment: no headache, epidural receding, patient able to bend at knees and no signs of nausea or vomiting Anesthetic complications: no Comments: Pt. States she is walking.  Pain score 3.     Last Vitals:  Vitals:   12/27/19 0830 12/27/19 0958  BP:  118/81  Pulse:  84  Resp:  18  Temp:  36.9 C  SpO2: 99%     Last Pain:  Vitals:   12/27/19 0958  TempSrc: Oral  PainSc:    Pain Goal: Patients Stated Pain Goal: 3 (12/24/19 0755)                 Rico Sheehan

## 2019-12-27 NOTE — Anesthesia Procedure Notes (Addendum)
Spinal  Patient location during procedure: OR Start time: 12/27/2019 10:41 AM End time: 12/27/2019 10:43 AM Staffing Anesthesiologist: Janeece Riggers, MD Preanesthetic Checklist Completed: patient identified, IV checked, site marked, risks and benefits discussed, surgical consent, monitors and equipment checked, pre-op evaluation and timeout performed Spinal Block Patient position: sitting Prep: DuraPrep Patient monitoring: heart rate, cardiac monitor, continuous pulse ox and blood pressure Approach: midline Location: L3-4 Injection technique: single-shot Needle Needle type: Sprotte  Needle gauge: 24 G Needle length: 9 cm Assessment Sensory level: T4

## 2019-12-27 NOTE — Progress Notes (Signed)
NICU Charge RN Mickel Baas aware of decision for pt.to go to c/s today. Discussed during 0900 rounds. Women[facility] Allena Napoleon aware as well.

## 2019-12-27 NOTE — Progress Notes (Addendum)
Patient ID: Danielle Short, female   DOB: 26-Jan-1986, 33 y.o.   MRN: [id number]    33 yo G4P111 at 33.2 weeks admitted with CHTN, SI severe pre eclampsia, IUGR, and abnormal dopplers.  LOS: 5 days  S. She had a relatively severe headache last night. She was given tylenol and the level of the pain decreased but it is still present this morning. She denies RUQ pain, visual changes. She reports good FM, denies VB, ROM, or contractions.  O. BPs 154/90 now, 153/107 last night on max doses of procardia and labetalol NST- reassuring Heart- rrr Lungs- CTAB Abd- benign, gravid DTR- 2+ and equal, brisk No edema  A/P. 33.[redacted] weeks ega with severe pre eclampsia- s/p BMZ, now with headache I will repeat labs this morning but I have told her that if there are abnormalities or her headache worsens, that her scheduled RLTCS and BTL may be moved earlier than 01-01-20.

## 2019-12-27 NOTE — Anesthesia Preprocedure Evaluation (Signed)
Anesthesia Evaluation  Patient identified by MRN, date of birth, ID band Patient awake    Reviewed: Allergy & Precautions, H&P , NPO status , Patient's Chart, lab work & pertinent test results, reviewed documented beta blocker date and time   Airway Mallampati: II  TM Distance: >3 FB Neck ROM: full    Dental no notable dental hx.    Pulmonary neg pulmonary ROS, former smoker,    Pulmonary exam normal breath sounds clear to auscultation       Cardiovascular hypertension, Pt. on medications and Pt. on home beta blockers negative cardio ROS Normal cardiovascular exam Rhythm:regular Rate:Normal     Neuro/Psych negative neurological ROS  negative psych ROS   GI/Hepatic negative GI ROS, Neg liver ROS,   Endo/Other  negative endocrine ROS  Renal/GU negative Renal ROS  negative genitourinary   Musculoskeletal   Abdominal (+) + obese,   Peds  Hematology  (+) Blood dyscrasia, anemia ,   Anesthesia Other Findings   Reproductive/Obstetrics (+) Pregnancy                             Anesthesia Physical Anesthesia Plan  ASA: III and emergent  Anesthesia Plan: Spinal   Post-op Pain Management:    Induction:   PONV Risk Score and Plan: 2 and Scopolamine patch - Pre-op  Airway Management Planned:   Additional Equipment:   Intra-op Plan:   Post-operative Plan:   Informed Consent: I have reviewed the patients History and Physical, chart, labs and discussed the procedure including the risks, benefits and alternatives for the proposed anesthesia with the patient or authorized representative who has indicated his/her understanding and acceptance.       Plan Discussed with: Anesthesiologist  Anesthesia Plan Comments:         Anesthesia Quick Evaluation

## 2019-12-28 LAB — COMPREHENSIVE METABOLIC PANEL
ALT: 16 U/L (ref 0–44)
AST: 32 U/L (ref 15–41)
Albumin: 2.6 g/dL — ABNORMAL LOW (ref 3.5–5.0)
Alkaline Phosphatase: 72 U/L (ref 38–126)
Anion gap: 9 (ref 5–15)
BUN: 17 mg/dL (ref 6–20)
CO2: 25 mmol/L (ref 22–32)
Calcium: 8.1 mg/dL — ABNORMAL LOW (ref 8.9–10.3)
Chloride: 101 mmol/L (ref 98–111)
Creatinine, Ser: 0.75 mg/dL (ref 0.44–1.00)
GFR calc Af Amer: >60 mL/min (ref >60)
GFR calc non Af Amer: >60 mL/min (ref >60)
Glucose, Bld: 90 mg/dL (ref 70–99)
Potassium: 4.3 mmol/L (ref 3.5–5.1)
Sodium: 135 mmol/L (ref 135–145)
Total Bilirubin: 0.3 mg/dL (ref 0.3–1.2)
Total Protein: 5.9 g/dL — ABNORMAL LOW (ref 6.5–8.1)

## 2019-12-28 LAB — CBC
HCT: 37.7 % (ref 36.0–46.0)
Hemoglobin: 11.5 g/dL — ABNORMAL LOW (ref 12.0–15.0)
MCH: 27.4 pg (ref 26.0–34.0)
MCHC: 30.5 g/dL (ref 30.0–36.0)
MCV: 89.8 fL (ref 80.0–100.0)
Platelets: 192 10*3/uL (ref 150–400)
RBC: 4.2 MIL/uL (ref 3.87–5.11)
WBC: 8.3 10*3/uL (ref 4.0–10.5)
nRBC: 0 % (ref 0.0–0.2)

## 2019-12-28 MED ORDER — IBUPROFEN 100 MG/5ML PO SUSP
800.0000 mg | Freq: Three times a day (TID) | ORAL | Status: DC
  Administered 2019-12-28 – 2019-12-30 (×6): 800 mg via ORAL
  Filled 2019-12-28 (×6): qty 40

## 2019-12-28 NOTE — Progress Notes (Signed)
Patient screened out for psychosocial assessment since none of the following apply: °Psychosocial stressors documented in mother or baby's chart °Gestation less than 32 weeks °Code at delivery  °Infant with anomalies °Please contact the Clinical Social Worker if specific needs arise, by MOB's request, or if MOB scores greater than 9/yes to question 10 on Edinburgh Postpartum Depression Screen. ° °Andree Golphin Boyd-Gilyard, MSW, LCSW °Clinical Social Work °[phone] °  °

## 2019-12-28 NOTE — Lactation Note (Signed)
This note was copied from a baby's chart. Lactation Consultation Note  Patient Name: Danielle Short Kennemer [id number] Date: 12/28/2019 Reason for consult: Initial assessment;1st time breastfeeding;NICU baby;Infant < 6lbs;Preterm <34wks  LC in to visit with P3 Mom of 33wk2d infant in the NICU.  Mom was set up with a DEBP by her RN and she states she has pumped 3 times so far.   Mom had poor experience with first 2 babies and didn't really every breastfeed.  Mom really wants to this time.    Mom doesn't have a DEBP at home.  Mom has Medicaid and doesn't have [place].  She will call to inquire tomorrow (Monday). Mom aware of pump rental program M-F in the gift shop. Mom also knows that babies room has a DEBP for her to use.  Reviewed breast massage and hand expression. And encouraged Mom to do this before and after pumping.  Unable to express colostrum currently.   Assisted Mom to pump and observed flanges, 24 mm flanges are a good fit for Mom currently.   Reviewed disassembling pump parts, washing, rinsing and air drying parts in separate bin provided.   Encouraged STS with baby in the NICU.  Encouraged pumping >8 times per 24 hrs. Lactation brochure left in room, and NICU booklet given to Mom and encouraged her to review information. Mom very appreciative of assistance.  To call for assistance from her RN prn.   Interventions Interventions: Breast feeding basics reviewed;Skin to skin;Breast massage;Hand express;Support pillows;DEBP  Lactation Tools Discussed/Used Tools: Pump Breast pump type: Double-Electric Breast Pump WIC Program: No Pump Review: Setup, frequency, and cleaning;Milk Storage Initiated by:: [place] RN Date initiated:: 12/27/19   Consult Status Consult Status: Follow-up Date: 12/29/19 Follow-up type: In-patient    Broadus John 12/28/2019, 9:18 AM

## 2019-12-28 NOTE — Progress Notes (Signed)
Daily Postpartum Note  Admission Date: 12/22/2019 Current Date: 12/28/2019 7:11 AM  Danielle Short is a 34 y.o. [id number] POD#1 pLTCS @ 73/2 for worsened severe pre-eclampsia, admitted for severe pre-eclampsia.  Pregnancy complicated by: Patient Active Problem List   Diagnosis Date Noted  . Previous cesarean section 12/27/2019  . Chronic hypertension with superimposed preeclampsia 12/22/2019  . Pregnancy affected by fetal growth restriction   . Pre-eclampsia added to pre-existing hypertension 12/10/2019  . H/O tubal ligation 11/26/2019  . H/O pre-eclampsia in prior pregnancy, currently pregnant 08/20/2019  . Iron deficiency anemia during pregnancy 07/24/2019  . Chronic hypertension affecting pregnancy 07/23/2019  . 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 Hx of cesarean section complicating pregnancy [id number]  . Supervision of high risk pregnancy, antepartum 07/08/2019    Overnight/24hr events:  none  Subjective:  No s/s of Mg toxicity, pt states she feels good, +flatus, taking good PO and pain medications working.   Objective:     Current Vital Signs 24h Vital Sign Ranges  T 97.7 F (36.5 C) Temp  Avg: 97.9 F (36.6 C)  Min: 97.3 F (36.3 C)  Max: 98.5 F (36.9 C)  BP 108/73 BP  Min: 108/73  Max: 159/109  HR 71 Pulse  Avg: 71  Min: 61  Max: 90  RR 18 Resp  Avg: 17.1  Min: 14  Max: 20  SaO2 97 % Room Air SpO2  Avg: 97.5 %  Min: 94 %  Max: 100 %       24 Hour I/O Current Shift I/O  Time Ins Outs 02/06 0701 - 02/07 0700 In: 4142.6 [P.O.:1080; I.V.:3062.6] Out: 2120 [Urine:1875] No intake/output data recorded.   Patient Vitals for the past 24 hrs:  BP Temp Temp src Pulse Resp SpO2  12/28/19 0655 -- -- -- -- 18 --  12/28/19 0600 -- -- -- -- 17 --  12/28/19 0500 -- -- -- -- 16 --  12/28/19 0400 -- -- -- -- 17 --  12/28/19 0339 108/73 97.7 F (36.5 C) Oral 71 18 97 %  12/28/19 0200 -- -- -- -- 17 --  12/28/19 0057 111/67 98.1 F (36.7 C) Oral 79 18 100 %  12/28/19 0000 -- -- -- --  18 --  12/27/19 2300 -- -- -- -- 17 --  12/27/19 2200 -- -- -- -- 17 --  12/27/19 2100 -- -- -- -- 18 --  12/27/19 1953 128/78 98.4 F (36.9 C) Oral 78 18 99 %  12/27/19 1759 -- -- -- -- 18 --  12/27/19 1700 126/84 -- -- 90 16 98 %  12/27/19 1600 -- -- -- -- 17 --  12/27/19 1501 114/83 (!) 97.5 F (36.4 C) Oral 67 16 --  12/27/19 1356 (!) 132/92 (!) 97.5 F (36.4 C) Oral -- -- --  12/27/19 1336 (!) 131/92 -- -- 63 15 96 %  12/27/19 1330 -- -- -- 69 16 100 %  12/27/19 1317 (!) 143/104 -- -- 61 16 98 %  12/27/19 1300 (!) 132/108 -- -- 61 14 98 %  12/27/19 1245 (!) 131/92 -- -- 67 20 94 %  12/27/19 1230 (!) 125/96 -- -- 61 15 94 %  12/27/19 1221 -- (!) 97.3 F (36.3 C) Oral -- 17 --  12/27/19 1220 (!) 125/93 -- -- -- -- --  12/27/19 0958 118/81 98.5 F (36.9 C) Oral 84 18 --  12/27/19 0830 -- -- -- -- -- 99 %  12/27/19 0829 (!) 159/109 98.4 F (36.9 C) Oral 72 20 --  UOP:/ >125mL/hr  Physical exam: General: Well nourished, well developed female in no acute distress. Abdomen: +BS, mild-moderately distended, c/d/i dressing, approp ttp, firm fundus below the umbilicus Cardiovascular: S1, S2 normal, no murmur, rub or gallop, regular rate and rhythm Respiratory: CTAB Extremities: no clubbing, cyanosis or edema Skin: Warm and dry.   Medications: Current Facility-Administered Medications  Medication Dose Route Frequency Provider Last Rate Last Admin  . coconut oil  1 application Topical PRN Clarnce Flock, MD      . witch hazel-glycerin (TUCKS) pad 1 application  1 application Topical PRN Dione Plover, Annice Needy, MD       And  . dibucaine (NUPERCAINAL) 1 % rectal ointment 1 application  1 application Rectal PRN Clarnce Flock, MD      . diphenhydrAMINE (BENADRYL) capsule 25 mg  25 mg Oral Q6H PRN Clarnce Flock, MD      . enoxaparin (LOVENOX) injection 40 mg  40 mg Subcutaneous Q24H Clarnce Flock, MD      . labetalol (NORMODYNE) injection 20 mg  20 mg Intravenous PRN  Clarnce Flock, MD       And  . labetalol (NORMODYNE) injection 40 mg  40 mg Intravenous PRN Clarnce Flock, MD       And  . labetalol (NORMODYNE) injection 80 mg  80 mg Intravenous PRN Clarnce Flock, MD       And  . hydrALAZINE (APRESOLINE) tablet 10 mg  10 mg Oral Q8H PRN Clarnce Flock, MD      . HYDROcodone-acetaminophen (NORCO/VICODIN) 5-325 MG per tablet 1-2 tablet  1-2 tablet Oral Q4H PRN Clarnce Flock, MD      . ketorolac (TORADOL) 30 MG/ML injection 30 mg  30 mg Intravenous Q6H Clarnce Flock, MD   30 mg at 12/28/19 0330   Followed by  . ibuprofen (ADVIL) tablet 800 mg  800 mg Oral Q8H Clarnce Flock, MD      . labetalol (NORMODYNE) tablet 800 mg  800 mg Oral Q8H Clarnce Flock, MD   800 mg at 12/28/19 [id number]  . lactated ringers infusion   Intravenous Continuous Aletha Halim, MD 75 mL/hr at 12/28/19 0700 Rate Verify at 12/28/19 0700  . magnesium sulfate 40 grams in SWI 1000 mL OB infusion  2 g/hr Intravenous Continuous Aletha Halim, MD 50 mL/hr at 12/28/19 0700 2 g/hr at 12/28/19 0700  . menthol-cetylpyridinium (CEPACOL) lozenge 3 mg  1 lozenge Oral Q2H PRN Clarnce Flock, MD      . polyethylene glycol (MIRALAX / GLYCOLAX) packet 17 g  17 g Oral Daily Aletha Halim, MD      . prenatal multivitamin tablet 1 tablet  1 tablet Oral Q1200 Clarnce Flock, MD   Stopped at 12/27/19 1551  . promethazine (PHENERGAN) injection 12.5 mg  12.5 mg Intravenous Q6H PRN Janeece Riggers, MD   12.5 mg at 12/27/19 1556  . senna-docusate (Senokot-S) tablet 2 tablet  2 tablet Oral QHS PRN Aletha Halim, MD      . simethicone (MYLICON) chewable tablet 80 mg  80 mg Oral TID PC Clarnce Flock, MD   80 mg at 12/27/19 1743    Labs:  Recent Labs  Lab 12/22/19 1723 12/27/19 0824  WBC 8.5 7.3  HGB 13.1 12.8  HCT 43.8 41.9  PLT 240 211    Recent Labs  Lab 12/22/19 1723 12/27/19 0824 12/28/19 0541  NA 135 137 135  K 4.6 3.9 4.3  CL  104 103 101   CO2 21* 24 25  BUN 12 9 17   CREATININE 0.77 0.78 0.75  CALCIUM 9.7 9.4 8.1*  PROT 7.6 6.5 5.9*  BILITOT 0.5 0.5 0.3  ALKPHOS 82 79 72  ALT 18 16 16   AST 25 17 32  GLUCOSE 110* 83 90     Radiology: none  Assessment & Plan:  Pt doing well *PP: routine care. Breast. BTL. A POS. F/u AM CBC *cHTN with severe pre-eclampsia: doing well on no meds. Watch BPs once off Mg later this morning *PPx: SCDs, lovenox *FEN/GI: regular diet *Dispo: likely pod#3  Durene Romans. MD Attending Center for [facility])

## 2019-12-29 ENCOUNTER — Ambulatory Visit (HOSPITAL_COMMUNITY): Payer: Medicaid Other

## 2019-12-29 MED ORDER — ENALAPRIL MALEATE 5 MG PO TABS
20.0000 mg | ORAL_TABLET | Freq: Every day | ORAL | Status: DC
  Administered 2019-12-29: 20 mg via ORAL
  Filled 2019-12-29: qty 4

## 2019-12-29 NOTE — Plan of Care (Signed)
  Problem: Activity: Goal: Ability to tolerate increased activity will improve Outcome: Progressing   

## 2019-12-29 NOTE — Progress Notes (Signed)
Subjective: Postpartum Day 2: Cesarean Delivery d/t prior c section and CHTN with superimposed severe PEC  Danielle Short has no complaints this morning. Ambulating and voiding without problems. Tolerating diet. Pain controlled. Denies HA or visual changes.  Objective: Vital signs in last 24 hours: Temp:  [98.4 F (36.9 C)-99.2 F (37.3 C)] 98.4 F (36.9 C) (02/08 0520) Pulse Rate:  [69-86] 76 (02/08 0520) Resp:  [17-18] 18 (02/08 0520) BP: (114-141)/(70-83) 132/83 (02/08 0520) SpO2:  [97 %-100 %] 100 % (02/08 0520)  Physical Exam:  General: alert Lochia: appropriate Uterine Fundus: firm Incision: drsg in place DVT Evaluation: No evidence of DVT seen on physical exam.  Recent Labs    12/27/19 0824 12/28/19 0541  HGB 12.8 11.5*  HCT 41.9 37.7    Assessment/Plan: POD # 2 RLTCS/BTL CHTN with SISPEC  Doing well. S/P magnesium. BP stable on labetalol. Continue with current management. Plan discharge home tomorrow.       Chancy Milroy 12/29/2019, 7:02 AM

## 2019-12-29 NOTE — Lactation Note (Addendum)
This note was copied from a baby's chart. Lactation Consultation Note  Patient Name: Danielle Short [id number] Date: 12/29/2019   Infant is 64 hrs old. I saw Mom in the hallway outside of her room & she inquired about getting a pump from [facility]. I informed Mom about our [facility] loaner program & she seemed interested. I placed the paperwork at her bedside.  Mom says she has been pumping around the clock since 0200 yesterday, but has not yet gotten anything. I reassured Mom & praised her for her efforts. Mom is on her way to the NICU with her pump parts to pump at infant's bedside now.   Mom is on labetalol 800mg  tid.  Matthias Hughs [facility] 12/29/2019, 9:07 AM

## 2019-12-30 LAB — SURGICAL PATHOLOGY

## 2019-12-30 MED ORDER — IBUPROFEN 100 MG/5ML PO SUSP: 800.0000 mg | Freq: Three times a day (TID) | ORAL | 2 refills | Status: DC | PRN

## 2019-12-30 MED ORDER — HYDROCODONE-ACETAMINOPHEN 5-325 MG PO TABS: 1.0000 | ORAL_TABLET | ORAL | 0 refills | Status: DC | PRN

## 2019-12-30 MED ORDER — LABETALOL HCL 200 MG PO TABS: 800.0000 mg | ORAL_TABLET | Freq: Three times a day (TID) | ORAL | 0 refills | Status: DC

## 2019-12-30 MED ORDER — IBUPROFEN 800 MG PO TABS: 800.0000 mg | ORAL_TABLET | Freq: Three times a day (TID) | ORAL | 0 refills | Status: DC | PRN

## 2019-12-30 MED ORDER — LABETALOL HCL 200 MG PO TABS
800.0000 mg | ORAL_TABLET | Freq: Three times a day (TID) | ORAL | Status: DC
  Administered 2019-12-30: 800 mg via ORAL
  Filled 2019-12-30: qty 4

## 2019-12-30 MED ORDER — SENNOSIDES-DOCUSATE SODIUM 8.6-50 MG PO TABS: 1.0000 | ORAL_TABLET | Freq: Two times a day (BID) | ORAL | 2 refills | Status: DC | PRN

## 2019-12-30 NOTE — Progress Notes (Signed)
Call to Dr. Harolyn Rutherford re: pt's increasing Bps discussed a switch back to labetalol TID. Will discuss with pt. This as an option and remind her she will have to remember to take labetalol 3x/day. Pt. Will discuss with team at rounds.

## 2019-12-30 NOTE — Progress Notes (Signed)
Discharge instructions given to patient. Reviewed csection care, postpartum instructions, medication changes and symptoms of hypertension. Patient received medications from [place].

## 2019-12-30 NOTE — Discharge Instructions (Signed)
Cesarean Delivery, Care After This sheet gives you information about how to care for yourself after your procedure. Your health care provider may also give you more specific instructions. If you have problems or questions, contact your health care provider. What can I expect after the procedure? After the procedure, it is common to have:  A small amount of blood or clear fluid coming from the incision.  Some redness, swelling, and pain in your incision area.  Some abdominal pain and soreness.  Vaginal bleeding (lochia). Even though you did not have a vaginal delivery, you will still have vaginal bleeding and discharge.  Pelvic cramps.  Fatigue. You may have pain, swelling, and discomfort in the tissue between your vagina and your anus (perineum) if:  Your C-section was unplanned, and you were allowed to labor and push.  An incision was made in the area (episiotomy) or the tissue tore during attempted vaginal delivery. Follow these instructions at home: Incision care   Follow instructions from your health care provider about how to take care of your incision. Make sure you: ? Wash your hands with soap and water before you change your bandage (dressing). If soap and water are not available, use hand sanitizer. ? If you have a dressing, change it or remove it as told by your health care provider. ? Leave stitches (sutures), skin staples, skin glue, or adhesive strips in place. These skin closures may need to stay in place for 2 weeks or longer. If adhesive strip edges start to loosen and curl up, you may trim the loose edges. Do not remove adhesive strips completely unless your health care provider tells you to do that.  Check your incision area every day for signs of infection. Check for: ? More redness, swelling, or pain. ? More fluid or blood. ? Warmth. ? Pus or a bad smell.  Do not take baths, swim, or use a hot tub until your health care provider says it's okay. Ask your health  care provider if you can take showers.  When you cough or sneeze, hug a pillow. This helps with pain and decreases the chance of your incision opening up (dehiscing). Do this until your incision heals. Medicines  Take over-the-counter and prescription medicines only as told by your health care provider.  If you were prescribed an antibiotic medicine, take it as told by your health care provider. Do not stop taking the antibiotic even if you start to feel better.  Do not drive or use heavy machinery while taking prescription pain medicine. Lifestyle  Do not drink alcohol. This is especially important if you are breastfeeding or taking pain medicine.  Do not use any products that contain nicotine or tobacco, such as cigarettes, e-cigarettes, and chewing tobacco. If you need help quitting, ask your health care provider. Eating and drinking  Drink at least 8 eight-ounce glasses of water every day unless told not to by your health care provider. If you breastfeed, you may need to drink even more water.  Eat high-fiber foods every day. These foods may help prevent or relieve constipation. High-fiber foods include: ? Whole grain cereals and breads. ? Brown rice. ? Beans. ? Fresh fruits and vegetables. Activity   If possible, have someone help you care for your baby and help with household activities for at least a few days after you leave the hospital.  Return to your normal activities as told by your health care provider. Ask your health care provider what activities are safe for  you.  Rest as much as possible. Try to rest or take a nap while your baby is sleeping.  Do not lift anything that is heavier than 10 lbs (4.5 kg), or the limit that you were told, until your health care provider says that it is safe.  Talk with your health care provider about when you can engage in sexual activity. This may depend on your: ? Risk of infection. ? How fast you heal. ? Comfort and desire to  engage in sexual activity. General instructions  Do not use tampons or douches until your health care provider approves.  Wear loose, comfortable clothing and a supportive and well-fitting bra.  Keep your perineum clean and dry. Wipe from front to back when you use the toilet.  If you pass a blood clot, save it and call your health care provider to discuss. Do not flush blood clots down the toilet before you get instructions from your health care provider.  Keep all follow-up visits for you and your baby as told by your health care provider. This is important. Contact a health care provider if:  You have: ? A fever. ? Bad-smelling vaginal discharge. ? Pus or a bad smell coming from your incision. ? Difficulty or pain when urinating. ? A sudden increase or decrease in the frequency of your bowel movements. ? More redness, swelling, or pain around your incision. ? More fluid or blood coming from your incision. ? A rash. ? Nausea. ? Little or no interest in activities you used to enjoy. ? Questions about caring for yourself or your baby.  Your incision feels warm to the touch.  Your breasts turn red or become painful or hard.  You feel unusually sad or worried.  You vomit.  You pass a blood clot from your vagina.  You urinate more than usual.  You are dizzy or light-headed. Get help right away if:  You have: ? Pain that does not go away or get better with medicine. ? Chest pain. ? Difficulty breathing. ? Blurred vision or spots in your vision. ? Thoughts about hurting yourself or your baby. ? New pain in your abdomen or in one of your legs. ? A severe headache.  You faint.  You bleed from your vagina so much that you fill more than one sanitary pad in one hour. Bleeding should not be heavier than your heaviest period. Summary  After the procedure, it is common to have pain at your incision site, abdominal cramping, and slight bleeding from your vagina.  Check  your incision area every day for signs of infection.  Tell your health care provider about any unusual symptoms.  Keep all follow-up visits for you and your baby as told by your health care provider. This information is not intended to replace advice given to you by your health care provider. Make sure you discuss any questions you have with your health care provider. Document Revised: 05/15/2018 Document Reviewed: 05/15/2018 Elsevier Patient Education  [place].    Postpartum Hypertension Postpartum hypertension is high blood pressure that remains higher than normal after childbirth. You may not realize that you have postpartum hypertension if your blood pressure is not being checked regularly. In most cases, postpartum hypertension will go away on its own, usually within a week of delivery. However, for some women, medical treatment is required to prevent serious complications, such as seizures or stroke. What are the causes? This condition may be caused by one or more of the  following:  Hypertension that existed before pregnancy (chronic hypertension).  Hypertension that comes on as a result of pregnancy (gestational hypertension).  Hypertensive disorders during pregnancy (preeclampsia) or seizures in women who have high blood pressure during pregnancy (eclampsia).  A condition in which the liver, platelets, and red blood cells are damaged during pregnancy (HELLP syndrome).  A condition in which the thyroid produces too much hormones (hyperthyroidism).  Other rare problems of the nerves (neurological disorders) or blood disorders. In some cases, the cause may not be known. What increases the risk? The following factors may make you more likely to develop this condition:  Chronic hypertension. In some cases, this may not have been diagnosed before pregnancy.  Obesity.  Type 2 diabetes.  Kidney disease.  History of preeclampsia or eclampsia.  Other medical  conditions that change the level of hormones in the body (hormonal imbalance). What are the signs or symptoms? As with all types of hypertension, postpartum hypertension may not have any symptoms. Depending on how high your blood pressure is, you may experience:  Headaches. These may be mild, moderate, or severe. They may also be steady, constant, or sudden in onset (thunderclap headache).  Changes in your ability to see (visual changes).  Dizziness.  Shortness of breath.  Swelling of your hands, feet, lower legs, or face. In some cases, you may have swelling in more than one of these locations.  Heart palpitations or a racing heartbeat.  Difficulty breathing while lying down.  Decrease in the amount of urine that you pass. Other rare signs and symptoms may include:  Sweating more than usual. This lasts longer than a few days after delivery.  Chest pain.  Sudden dizziness when you get up from sitting or lying down.  Seizures.  Nausea or vomiting.  Abdominal pain. How is this diagnosed? This condition may be diagnosed based on the results of a physical exam, blood pressure measurements, and blood and urine tests. You may also have other tests, such as a CT scan or an MRI, to check for other problems of postpartum hypertension. How is this treated? If blood pressure is high enough to require treatment, your options may include:  Medicines to reduce blood pressure (antihypertensives). Tell your health care provider if you are breastfeeding or if you plan to breastfeed. There are many antihypertensive medicines that are safe to take while breastfeeding.  Stopping medicines that may be causing hypertension.  Treating medical conditions that are causing hypertension.  Treating the complications of hypertension, such as seizures, stroke, or kidney problems. Your health care provider will also continue to monitor your blood pressure closely until it is within a safe range for  you. Follow these instructions at home:  Take over-the-counter and prescription medicines only as told by your health care provider.  Return to your normal activities as told by your health care provider. Ask your health care provider what activities are safe for you.  Do not use any products that contain nicotine or tobacco, such as cigarettes and e-cigarettes. If you need help quitting, ask your health care provider.  Keep all follow-up visits as told by your health care provider. This is important. Contact a health care provider if:  Your symptoms get worse.  You have new symptoms, such as: ? A headache that does not get better. ? Dizziness. ? Visual changes. Get help right away if:  You suddenly develop swelling in your hands, ankles, or face.  You have sudden, rapid weight gain.  You develop  difficulty breathing, chest pain, racing heartbeat, or heart palpitations.  You develop severe pain in your abdomen.  You have any symptoms of a stroke. "BE FAST" is an easy way to remember the main warning signs of a stroke: ? B - Balance. Signs are dizziness, sudden trouble walking, or loss of balance. ? E - Eyes. Signs are trouble seeing or a sudden change in vision. ? F - Face. Signs are sudden weakness or numbness of the face, or the face or eyelid drooping on one side. ? A - Arms. Signs are weakness or numbness in an arm. This happens suddenly and usually on one side of the body. ? S - Speech. Signs are sudden trouble speaking, slurred speech, or trouble understanding what people say. ? T - Time. Time to call emergency services. Write down what time symptoms started.  You have other signs of a stroke, such as: ? A sudden, severe headache with no known cause. ? Nausea or vomiting. ? Seizure. These symptoms may represent a serious problem that is an emergency. Do not wait to see if the symptoms will go away. Get medical help right away. Call your local emergency services (911 in  the [place] Do not drive yourself to the hospital. Summary  Postpartum hypertension is high blood pressure that remains higher than normal after childbirth.  In most cases, postpartum hypertension will go away on its own, usually within a week of delivery.  For some women, medical treatment is required to prevent serious complications, such as seizures or stroke. This information is not intended to replace advice given to you by your health care provider. Make sure you discuss any questions you have with your health care provider. Document Revised: 12/13/2018 Document Reviewed: 08/27/2017 Elsevier Patient Education  2020 [employer].

## 2019-12-30 NOTE — Lactation Note (Signed)
This note was copied from a baby's chart. Lactation Consultation Note  Patient Name: Danielle Short [id number] Date: 12/30/2019 Reason for consult: Follow-up assessment;NICU baby;Preterm <34wks;Infant < 6lbs  LC in to visit with P3 Mom of 39 hr old preterm baby in the NICU.  Mom has been double pumping regularly. Mom isn't able to express any colostrum yet.  Recommended she use warm compresses and breast massage prior to pumping, do as much STS as possible with baby in the NICU, and continue regular pumping.  Reassurance given.  Mom had been informed of [facility] loaner program from [facility] yesterday, was given paperwork and has $30 cash today.  Mom has not talked with WIC yet.  [place] faxed [place] referral this am, asking for Mom to be added due to her infant being in NICU and Mom has Medicaid.    Loaner pump for 12 days given to Mom.    Mom's BP still elevated and may be discharged later today as she is on a new medication.  Engorgement prevention and treatment reviewed.   Interventions Interventions: Breast feeding basics reviewed;Skin to skin;Breast massage;Hand express  Lactation Tools Discussed/Used Tools: Pump Breast pump type: Double-Electric Breast Pump Date initiated:: 12/30/19   Consult Status Consult Status: Complete Date: 12/30/19 Follow-up type: Call as needed    Broadus John 12/30/2019, 9:24 AM

## 2019-12-30 NOTE — Progress Notes (Signed)
Postpartum Day 3: Cesarean Delivery d/t prior c section and CHTN with superimposed severe PEC  Subjective: Was switched to Enalapril yesterday night but patient desires to be back on Labetalol given high BP this morning. Does not want any changes to any other medication.   Ambulating and voiding without problems. Tolerating diet. Pain controlled. Had flatus. Denies HA or visual changes.  Objective: Vital signs in last 24 hours: Temp:  [98 F (36.7 C)-99.5 F (37.5 C)] 98 F (36.7 C) (02/09 0514) Pulse Rate:  [67-94] 68 (02/09 0531) Resp:  [18] 18 (02/09 0514) BP: (135-155)/(81-102) 155/100 (02/09 0531) SpO2:  [100 %] 100 % (02/09 0514)  Patient Vitals for the past 24 hrs:  BP Temp Temp src Pulse Resp SpO2  12/30/19 0531 (!) 155/100 -- -- 68 -- --  12/30/19 0514 (!) 154/102 98 F (36.7 C) Oral 67 18 100 %  12/29/19 2207 139/88 98.9 F (37.2 C) Oral 83 18 100 %  12/29/19 1629 (!) 140/91 -- -- 93 -- --  12/29/19 1608 (!) 144/90 99.5 F (37.5 C) Oral 94 18 100 %  12/29/19 1145 135/81 98.8 F (37.1 C) Oral 80 18 100 %  12/29/19 0740 (!) 140/92 98.9 F (37.2 C) Oral 90 18 100 %    Physical Exam:  General: alert Lochia: appropriate Uterine Fundus: firm Incision: drsg in place, C/D/I DVT Evaluation: No evidence of DVT seen on physical exam.  Recent Labs    12/27/19 0824 12/28/19 0541  HGB 12.8 11.5*  HCT 41.9 37.7    Assessment/Plan: POD # 3 RLTCS/BTL CHTN with SISPEC  Doing well. S/P magnesium. Will put back on Labetalol. Continue with current management. Plan discharge home later today once BP is better.   Verita Schneiders, MD 12/30/2019, 7:28 AM

## 2020-01-01 ENCOUNTER — Telehealth: Payer: Self-pay | Admitting: *Deleted

## 2020-01-01 ENCOUNTER — Telehealth: Payer: Medicaid Other | Admitting: Obstetrics and Gynecology

## 2020-01-01 ENCOUNTER — Ambulatory Visit (HOSPITAL_COMMUNITY): Payer: Medicaid Other

## 2020-01-01 NOTE — Telephone Encounter (Signed)
Pt called office stating she had questions about maternity leave.  Attempt to return call. No answer, LM on VM to call as needed.

## 2020-01-01 NOTE — Telephone Encounter (Signed)
Spoke with pt regarding LOA. Advised that she may have employer send FMLA paperwork to our office in order to have completed. Our contact/fax number given.

## 2020-01-02 ENCOUNTER — Other Ambulatory Visit: Payer: Self-pay

## 2020-01-02 ENCOUNTER — Ambulatory Visit (INDEPENDENT_AMBULATORY_CARE_PROVIDER_SITE_OTHER): Payer: Medicaid Other

## 2020-01-02 VITALS — BP 126/85 | HR 85

## 2020-01-02 DIAGNOSIS — Z0130 Encounter for examination of blood pressure without abnormal findings: Secondary | ICD-10-CM

## 2020-01-02 NOTE — Progress Notes (Signed)
Pt is here for a bp check. She reports taking her BP med this morning before coming to the office.  She denied dizziness, headache, blurred vision. First bp was mildly elevated at 144/99 pulse 81. I let the patient sit for about 3-5 mins.  When getting her bp the second time I have pt elevated her arm to heart level and the second reading was 126/85 pulse 85. I advised pt that she would be contacted if there were further recommendations. -EH/RMA

## 2020-01-05 ENCOUNTER — Ambulatory Visit (HOSPITAL_COMMUNITY): Payer: Medicaid Other

## 2020-01-07 ENCOUNTER — Ambulatory Visit: Payer: Self-pay

## 2020-01-07 NOTE — Lactation Note (Signed)
This note was copied from a baby's chart. Lactation Consultation Note  Patient Name: Danielle Short [id number] Date: 01/07/2020   NICU RN, Rolla Plate, called to say that Mom wanted a visit with a Lactation Consultant. Mom had questions about using her DEBPs. Per Mom, the pump in the room is working well, but her [facility] loaner only seems to be working on 1 side.  The lactation student disassembled the pump parts & cleaned the insides of the yellow valves. Mom admitted to not knowing that the white membrane was to be separated from the yellow valve with cleaning. I then observed Mom pumping with size 24 flanges. Although there seemed to be slightly more suction on one side than the other, both sides worked and were able to extract milk. Dad commented that earlier the side they were concerned about wasn't moving the nipple at all.  The hand-out from the CDC, "How to Keep Your Breast Pump Kit Clean," was provided to Mom & we talked about sanitizing options. Parents were also shown how to clear tubing of moisture. I also reminded Mom to dry the pump parts before expressing her milk.    Matthias Hughs [facility] 01/07/2020, 10:35 AM

## 2020-01-08 ENCOUNTER — Ambulatory Visit (HOSPITAL_COMMUNITY): Payer: Medicaid Other

## 2020-01-08 NOTE — Patient Instructions (Signed)
Danielle Short  01/08/2020   Your procedure is scheduled on:  01/22/2020  Arrive at 17 at Entrance C on [address] at [facility]  and [address]. You are invited to use the FREE valet parking or use the Visitor's parking deck.  Pick up the phone at the desk and dial [phone].  Call this number if you have problems the morning of surgery: [phone]  Remember:   Do not eat food:(After Midnight) Desps de medianoche.  Do not drink clear liquids: (After Midnight) Desps de medianoche.  Take these medicines the morning of surgery with A SIP OF WATER:  Labetalol as prescribed   Do not wear jewelry, make-up or nail polish.  Do not wear lotions, powders, or perfumes. Do not wear deodorant.  Do not shave 48 hours prior to surgery.  Do not bring valuables to the hospital.  [facility] is not   responsible for any belongings or valuables brought to the hospital.  Contacts, dentures or bridgework may not be worn into surgery.  Leave suitcase in the car. After surgery it may be brought to your room.  For patients admitted to the hospital, checkout time is 11:00 AM the day of              discharge.      Please read over the following fact sheets that you were given:     Preparing for Surgery

## 2020-01-09 ENCOUNTER — Other Ambulatory Visit: Payer: Self-pay

## 2020-01-09 ENCOUNTER — Ambulatory Visit (INDEPENDENT_AMBULATORY_CARE_PROVIDER_SITE_OTHER): Payer: Medicaid Other | Admitting: Medical

## 2020-01-09 ENCOUNTER — Telehealth (HOSPITAL_COMMUNITY): Payer: Self-pay | Admitting: *Deleted

## 2020-01-09 VITALS — BP 116/83 | HR 72 | Wt 184.5 lb

## 2020-01-09 DIAGNOSIS — Z9889 Other specified postprocedural states: Secondary | ICD-10-CM

## 2020-01-09 DIAGNOSIS — O34219 Maternal care for unspecified type scar from previous cesarean delivery: Secondary | ICD-10-CM

## 2020-01-09 NOTE — Progress Notes (Signed)
I was present and assisted in the removal of the honeycomb dressing. An old blood collection had caused a scab under the honeycomb along the incision line. With removal of the honeycomb there was a small amount of bleeding along the incision line. It was cleaned and no active bleeding, discharge or surrounding erythema was noted. The wound is well-approximated. Three new steri-strips were applied and the patient was given extensive instructions on how to clean and avoid infection. Follow-up as planned for routine prenatal visit or sooner PRN.   Luvenia Redden, PA-C 01/09/2020 3:06 PM

## 2020-01-09 NOTE — Telephone Encounter (Signed)
Preadmission screen  

## 2020-01-09 NOTE — Progress Notes (Signed)
Pt is in the office for pp incision check, c section 12-27-19. With provider's assistance, honeycomb dressing removed and new steri strips applied. Pt will return for pp visit on 01-23-20.

## 2020-01-16 ENCOUNTER — Ambulatory Visit: Payer: Self-pay

## 2020-01-16 NOTE — Lactation Note (Signed)
This note was copied from a baby's chart. Lactation Consultation Note  Patient Name: Danielle Short [id number] Date: 01/16/2020  [name] is 79 weeks old in the NICU.(36.1 [facility])  Mom is concerned about her supply.  She pumps every 3 hours during the day and sleeps through the night.  She obtains 30 mls total.  Stressed importance of pumping at least 8 times/24 hours.  Instructed to pump every 2-3 hours during the day and setting an alarm at night to pump at 5-6 hours.  Instructed to power pump twice per day.  Discussed importance of eating well and drinking plenty of fluids.  Mom receptive to teaching and recommendations.   Maternal Data    Feeding Feeding Type: Breast Milk Nipple Type: Nfant Extra Slow Flow (gold)  LATCH Score                   Interventions    Lactation Tools Discussed/Used     Consult Status      Ave Filter 01/16/2020, 2:33 PM

## 2020-01-19 ENCOUNTER — Ambulatory Visit: Payer: Self-pay

## 2020-01-19 ENCOUNTER — Encounter (HOSPITAL_COMMUNITY): Payer: Self-pay | Admitting: Anesthesiology

## 2020-01-19 NOTE — Lactation Note (Signed)
This note was copied from a baby's chart. Lactation Consultation Note  Patient Name: Boy Jernee Kisling [id number] Date: 01/19/2020 Reason for consult: Follow-up assessment  RN requested [place] Visit:  Mother with low milk supply  Mother continues to have a low milk supply.  She stated that the drop in supply started more last week.  There has not been any significant changes that would cause a drop.  She is now pumping approximately 10 mls per session.  Discussed relaxation, nutrition, hand expression, breast massage, warm compresses before pumping, pumping at baby's bedside, increasing pumping times (especially throughout the night) and power pumping.  Explained to mother that I would like her to try power pumping three times/day for the next three days to see if this may help.  Reminded her that it is not going to work as a "one time" effort.  Also encouraged pumping more consistently and not skipping night time pumpings.  Mother will increase fluid intake and nutrition.  Encouraged to pump every 2 1/2-3 hours with breast compressions during pumping.  She will be in contact with her nurses if this does not help and they will call us for further guidance.  Father present and supportive.  Also suggested using our hospital grade pump as much as possible.  Mother has a [employer] pump at home.  Mother has private insurance and I suggested she call to determine pump eligibility for a new DEBP.  Mother will follow up.   Maternal Data    Feeding Feeding Type: Donor Breast Milk Nipple Type: Nfant Slow Flow (purple)  LATCH Score                   Interventions    Lactation Tools Discussed/Used     Consult Status Consult Status: PRN Follow-up type: Call as needed    Khia Dieterich R Isaia Hassell 01/19/2020, 4:13 PM

## 2020-01-20 ENCOUNTER — Other Ambulatory Visit (HOSPITAL_COMMUNITY)
Admission: RE | Admit: 2020-01-20 | Discharge: 2020-01-20 | Disposition: A | Discharge status: Home / Self Care | Payer: Medicaid Other | Source: Ambulatory Visit

## 2020-01-21 ENCOUNTER — Ambulatory Visit: Payer: Self-pay

## 2020-01-21 NOTE — Lactation Note (Signed)
This note was copied from a baby's chart. Lactation Consultation Note  Patient Name: Danielle Short [id number] Date: 01/21/2020 Reason for consult: Follow-up assessment;Mother's request;1st time breastfeeding;NICU baby;Preterm <34wks  Mother scheduled appointment for [facility] at 6:00pm. Wrong room number written down, Nurse called 20 mins later and LC went to the room to speak with parents. Infant just finished eating and full. Mom requested a different [place] appointment for 3/4 @ 10:00am.  Per RN Collie Siad, Infant is doing a great job latching at the breast. Mom attempted to latch him again but infant was full. Mom received recommendations from [facility] on 3/1 to power pump and she started in fact the same day. Mom has not seen any results just yet but very interested in improving milk supply. She reported only pumping 4x's throughout the day. She has a Medela DEBP from [facility] that she uses. LC praised mom for her efforts and encouraged her to pump atleast every 2 hours to improve milk supply. Mom stated she is very motivated to breastfeed infant and wants to continue going in the right direction. Dad is at the bedside as well and continues to encourage Mom's breastfeeding efforts. [place] educated Mom on frequently pumping using a consistent schedule, milk storage guidelines, and lubricating the breast using coconut oil prior to pumping for comfort.  LC encouraged Mom to call if she has any further questions or concerns. Mom and Dad reported no questions or concerns at this time.     Maternal Data Does the patient have breastfeeding experience prior to this delivery?: No  Feeding Feeding Type: Formula Nipple Type: Other(Avent premie nipple)  LATCH Score Latch: Grasps breast easily, tongue down, lips flanged, rhythmical sucking.  Audible Swallowing: Spontaneous and intermittent  Type of Nipple: Everted at rest and after stimulation  Comfort (Breast/Nipple): Soft / non-tender  Hold (Positioning): No assistance  needed to correctly position infant at breast.  LATCH Score: 10  Interventions Interventions: Coconut oil;DEBP  Lactation Tools Discussed/Used WIC Program: Yes Pump Review: Milk Storage   Consult Status Consult Status: Follow-up Date: 01/22/20 Follow-up type: In-patient    Darwyn Ponzo 01/21/2020, 7:06 PM

## 2020-01-22 ENCOUNTER — Ambulatory Visit: Payer: Self-pay

## 2020-01-22 ENCOUNTER — Inpatient Hospital Stay (HOSPITAL_COMMUNITY)
Admission: RE | Admit: 2020-01-22 | Payer: Medicaid Other | Source: Home / Self Care | Admitting: Obstetrics & Gynecology

## 2020-01-22 SURGERY — Surgical Case
Anesthesia: Regional | Laterality: Bilateral

## 2020-01-22 NOTE — Lactation Note (Signed)
This note was copied from a baby's chart. Lactation Consultation Note  Patient Name: Danielle Short [id number] Date: 01/22/2020 Reason for consult: Follow-up assessment Baby is 76 weeks old(37.0 PMA).  Mom states she started on a pumping schedule yesterday and has been able to obtain 60 mls.  She reports that baby latched well yesterday.  Baby awake and showing feeding cues.  Assisted with positioning baby in cross cradle.  Hand expressed a few drops into baby's mouth.  Baby latched after several attempts but then became sleepy.  Positioned in football hold.  Baby latched but had difficulty sustaining latch.  Relatched several times.  Non-nutritive sucking with no swallows observed.  Reassured mom and encouraged to continue practicing at breast.  Stressed importance of pumping often to maintain a good milk supply.  Recommended an outpatient appointment for breastfeeding assessment and assist.  Maternal Data    Feeding Feeding Type: Breast Fed Nipple Type: Other  LATCH Score Latch: Repeated attempts needed to sustain latch, nipple held in mouth throughout feeding, stimulation needed to elicit sucking reflex.  Audible Swallowing: None  Type of Nipple: Everted at rest and after stimulation  Comfort (Breast/Nipple): Soft / non-tender  Hold (Positioning): Assistance needed to correctly position infant at breast and maintain latch.  LATCH Score: 6  Interventions Interventions: Breast compression;Assisted with latch;Adjust position;Support pillows;Breast massage;Hand express  Lactation Tools Discussed/Used     Consult Status Consult Status: PRN    Ave Filter 01/22/2020, 10:50 AM

## 2020-01-23 ENCOUNTER — Telehealth (INDEPENDENT_AMBULATORY_CARE_PROVIDER_SITE_OTHER): Payer: Medicaid Other | Admitting: Obstetrics and Gynecology

## 2020-01-23 ENCOUNTER — Encounter: Payer: Self-pay | Admitting: Obstetrics and Gynecology

## 2020-01-23 ENCOUNTER — Ambulatory Visit: Payer: Self-pay

## 2020-01-23 MED ORDER — ENALAPRIL MALEATE 5 MG PO TABS: 5.0000 mg | ORAL_TABLET | Freq: Every day | ORAL | 6 refills | Status: DC

## 2020-01-23 NOTE — Lactation Note (Signed)
This note was copied from a baby's chart. Lactation Consultation Note  Patient Name: Boy Dorrace Rouse [id number] Date: 01/23/2020 Reason for consult: Follow-up assessment;NICU baby  Day of discharge, baby 34 weeks old, AGA 37 weeks.    Baby primarily bottle feeding EBM or formula.  Mom is pumping regularly, and now is getting up at night to pump.  Expressing 2-3 oz per session.   Would like Lactation OP appointment, request made.  Encouraged STS and offering breast prior to supplementing with bottle.  Encouraged frequent pumping to support a full milk supply.   Mom has a Symphony DEBP from [facility] at home.  Encouraged to call prn for concerns.  Interventions Interventions: Skin to skin;Breast massage;Hand express;DEBP  Lactation Tools Discussed/Used Tools: Pump Breast pump type: Double-Electric Breast Pump   Consult Status Consult Status: Complete Date: 01/23/20 Follow-up type: Out-patient    Broadus John 01/23/2020, 3:35 PM

## 2020-01-23 NOTE — Lactation Note (Signed)
This note was copied from a baby's chart. Lactation Consultation Note  Patient Name: Danielle Short Today's Date: 01/23/2020   [facility] visit attempted, but no support persons were present in infant's room.    Matthias Hughs [facility] 01/23/2020, 8:16 AM

## 2020-01-23 NOTE — Progress Notes (Signed)
I connected with Danielle Short on 01/23/20 at  9:45 AM EST by: MyChart and verified that I am speaking with the correct person using two identifiers.  Patient is located at home and provider is located at [facility].     The purpose of this virtual visit is to provide medical care while limiting exposure to the novel coronavirus. I discussed the limitations, risks, security and privacy concerns of performing an evaluation and management service by MyChart and the availability of in person appointments. I also discussed with the patient that there may be a patient responsible charge related to this service. By engaging in this virtual visit, you consent to the provision of healthcare.  Additionally, you authorize for your insurance to be billed for the services provided during this visit.  The patient expressed understanding and agreed to proceed.  Post Partum Visit Note Subjective:    Ms. Danielle Short is a 34 y.o. [phone] female who presents for a postpartum visit. She is 4 weeks postpartum following a low cervical transverse Cesarean section. I have fully reviewed the prenatal and intrapartum course. The delivery was at 33.2 gestational weeks. Outcome: repeat cesarean section, low transverse incision. Anesthesia: spinal. Postpartum course has been good. Baby is in NICU. Baby is feeding by both breast and bottle - Neosure. Bleeding staining only. Bowel function is normal. Bladder function is normal. Patient is not sexually active. Contraception method is tubal ligation. Postpartum depression screening: negative. Patient reports incision healing well without redness or drainage     Review of Systems Pertinent items noted in HPI and remainder of comprehensive ROS otherwise negative.   Objective:   Vitals:   01/23/20 1003  BP: (!) 125/97  Pulse: 84   Self-Obtained       Assessment:    Normal postpartum exam. Pap smear not done at today's visit. Last pap smear 2019 and results were negative.     Plan:    1. Contraception: tubal ligation 2. Patient is medically cleared to resume all activities. Will change labetalol to enalapril to ease compliance 3. Follow up in: 6 months for annual exam with pap smear or as needed.   11 minutes of non-face-to-face time spent with the patient   Constant, Vickii Chafe, MD 01/23/2020 10:12 AM

## 2020-02-10 ENCOUNTER — Other Ambulatory Visit: Payer: Self-pay

## 2020-02-10 ENCOUNTER — Ambulatory Visit (INDEPENDENT_AMBULATORY_CARE_PROVIDER_SITE_OTHER): Payer: Medicaid Other | Admitting: Obstetrics and Gynecology

## 2020-02-10 ENCOUNTER — Encounter: Payer: Self-pay | Admitting: Obstetrics and Gynecology

## 2020-02-10 DIAGNOSIS — Z9889 Other specified postprocedural states: Secondary | ICD-10-CM | POA: Insufficient documentation

## 2020-02-10 NOTE — Progress Notes (Signed)
Presents for Incision pain 4/10

## 2020-02-10 NOTE — Progress Notes (Signed)
Patient ID: Danielle Short, female   DOB: 06-Dec-1985, 34 y.o.   MRN: [id number] Pt presents with c/o pain at incision site. S/P RLTCS x3 12/27/19 No fever. No incisional drainage or discharge Denies bowel or bladder dysfunction  PE AF VSS Lungs clear Heart RRR Abd soft + BS Incision well healed, no evidence of infection noted, scar tissue around all 3 incision sites, scar tissue is uncomfortable to palpitation  A/P Post Op state  Pt reassured that discomfort is most likely related to scar tissue. Vitamin E oil recommended F/U PRN

## 2020-02-22 ENCOUNTER — Encounter (HOSPITAL_BASED_OUTPATIENT_CLINIC_OR_DEPARTMENT_OTHER): Payer: Self-pay | Admitting: Emergency Medicine

## 2020-02-22 ENCOUNTER — Other Ambulatory Visit: Payer: Self-pay

## 2020-02-22 ENCOUNTER — Emergency Department (HOSPITAL_BASED_OUTPATIENT_CLINIC_OR_DEPARTMENT_OTHER)
Admission: EM | Admit: 2020-02-22 | Discharge: 2020-02-22 | Disposition: A | Discharge status: Home / Self Care | Payer: Medicaid Other | Attending: Emergency Medicine | Admitting: Emergency Medicine

## 2020-02-22 DIAGNOSIS — B9689 Other specified bacterial agents as the cause of diseases classified elsewhere: Secondary | ICD-10-CM

## 2020-02-22 DIAGNOSIS — Z87891 Personal history of nicotine dependence: Secondary | ICD-10-CM | POA: Insufficient documentation

## 2020-02-22 DIAGNOSIS — I10 Essential (primary) hypertension: Secondary | ICD-10-CM | POA: Insufficient documentation

## 2020-02-22 DIAGNOSIS — N760 Acute vaginitis: Secondary | ICD-10-CM | POA: Diagnosis not present

## 2020-02-22 DIAGNOSIS — N939 Abnormal uterine and vaginal bleeding, unspecified: Secondary | ICD-10-CM

## 2020-02-22 DIAGNOSIS — Z79899 Other long term (current) drug therapy: Secondary | ICD-10-CM | POA: Diagnosis not present

## 2020-02-22 LAB — CBC WITH DIFFERENTIAL/PLATELET
Abs Immature Granulocytes: 0.02 10*3/uL (ref 0.00–0.07)
Basophils Absolute: 0 10*3/uL (ref 0.0–0.1)
Basophils Relative: 0 %
Eosinophils Absolute: 0 10*3/uL (ref 0.0–0.5)
Eosinophils Relative: 1 %
HCT: 44.7 % (ref 36.0–46.0)
Hemoglobin: 14.4 g/dL (ref 12.0–15.0)
Immature Granulocytes: 0 %
Lymphocytes Relative: 32 %
Lymphs Abs: 1.6 10*3/uL (ref 0.7–4.0)
MCH: 29.1 pg (ref 26.0–34.0)
MCHC: 32.2 g/dL (ref 30.0–36.0)
MCV: 90.5 fL (ref 80.0–100.0)
Monocytes Absolute: 0.4 10*3/uL (ref 0.1–1.0)
Monocytes Relative: 7 %
Neutro Abs: 2.9 10*3/uL (ref 1.7–7.7)
Neutrophils Relative %: 60 %
Platelets: 192 10*3/uL (ref 150–400)
RBC: 4.94 MIL/uL (ref 3.87–5.11)
RDW: 17.6 % — ABNORMAL HIGH (ref 11.5–15.5)
WBC: 4.9 10*3/uL (ref 4.0–10.5)
nRBC: 0 % (ref 0.0–0.2)

## 2020-02-22 LAB — URINALYSIS, ROUTINE W REFLEX MICROSCOPIC
Bilirubin Urine: NEGATIVE
Glucose, UA: NEGATIVE mg/dL
Ketones, ur: 15 mg/dL — AB
Leukocytes,Ua: NEGATIVE
Nitrite: NEGATIVE
Protein, ur: 30 mg/dL — AB
Specific Gravity, Urine: >1.03 — ABNORMAL HIGH (ref 1.005–1.030)
pH: 6 (ref 5.0–8.0)

## 2020-02-22 LAB — WET PREP, GENITAL
Sperm: NONE SEEN
Trich, Wet Prep: NONE SEEN
Yeast Wet Prep HPF POC: NONE SEEN

## 2020-02-22 LAB — URINALYSIS, MICROSCOPIC (REFLEX): RBC / HPF: >50 RBC/hpf (ref 0–5)

## 2020-02-22 LAB — BASIC METABOLIC PANEL
Anion gap: 8 (ref 5–15)
BUN: 10 mg/dL (ref 6–20)
CO2: 23 mmol/L (ref 22–32)
Calcium: 8.9 mg/dL (ref 8.9–10.3)
Chloride: 105 mmol/L (ref 98–111)
Creatinine, Ser: 0.68 mg/dL (ref 0.44–1.00)
GFR calc Af Amer: >60 mL/min (ref >60)
GFR calc non Af Amer: >60 mL/min (ref >60)
Glucose, Bld: 96 mg/dL (ref 70–99)
Potassium: 3.9 mmol/L (ref 3.5–5.1)
Sodium: 136 mmol/L (ref 135–145)

## 2020-02-22 LAB — PREGNANCY, URINE: Preg Test, Ur: NEGATIVE

## 2020-02-22 MED ORDER — METRONIDAZOLE 500 MG PO TABS: 500.0000 mg | ORAL_TABLET | Freq: Two times a day (BID) | ORAL | 0 refills | Status: DC

## 2020-02-22 NOTE — ED Triage Notes (Signed)
Pt states that she just started her cycle since her last pregnancy. The patient states that her menstrual  Is usually heavy - she states yesterday she was lighter, but today she is saturating a pad every 2 hours now.

## 2020-02-22 NOTE — ED Provider Notes (Signed)
[facility] EMERGENCY DEPARTMENT Provider Note   CSN: [id number] Arrival date & time: 02/22/20  1204     History Chief Complaint  Patient presents with  . Vaginal Bleeding    Danielle Short is a 34 y.o. female with a past medical history significant for anemia, hypertension, and uterine fibroids who presents to the ED due to worsening vaginal bleeding for the past 2 days.  Patient states she has a history of heavy menstrual cycles; however, this has been slightly heavier than normal.  She notes she has been saturating a maxi pad every 2 hours.  Patient recently delivered her third child on 12/27/2019 via C-section due to preeclampsia.  Patient notes her menstrual cycle was late yesterday and has progressively gotten heavier over the past 24 hours.  Denies abdominal cramping.  She had a tubal ligation following her C-section.  She has been sexually active since she gave birth without protection.  No concern for STDs at this time.  Denies increased vaginal discharge and urinary symptoms.  He admits to uterine fibroids throughout her pregnancy.  Denies abdominal pain, nausea, vomiting, diarrhea.  Denies dizziness, lightheadedness, chest pain, shortness of breath. She is not currently breastfeeding.  History obtained from patient and past medical records. No interpreter used during encounter.      Past Medical History:  Diagnosis Date  . Anemia   . Chronic hypertension with superimposed preeclampsia 12/22/2019  . Hypertension during pregnancy  . Pre-eclampsia added to pre-existing hypertension 12/10/2019   Will need delivery at 37 weeks  . Pregnancy affected by fetal growth restriction   . Pregnancy induced hypertension   . Uterine fibroids affecting pregnancy     Patient Active Problem List   Diagnosis Date Noted  . Post-operative state 02/10/2020  . Previous cesarean section 12/27/2019  . H/O tubal ligation 11/26/2019  . H/O pre-eclampsia in prior pregnancy, currently pregnant  08/20/2019  . Iron deficiency anemia during pregnancy 07/24/2019    Past Surgical History:  Procedure Laterality Date  . CESAREAN SECTION    . CESAREAN SECTION WITH BILATERAL TUBAL LIGATION N/A 12/27/2019   Procedure: CESAREAN SECTION WITH BILATERAL TUBAL LIGATION;  Surgeon: Aletha Halim, MD;  Location: [facility];  Service: Obstetrics;  Laterality: N/A;     OB History    Gravida  4   Para  3   Term  1   Preterm  2   AB  1   Living  2     SAB      TAB  1   Ectopic      Multiple  0   Live Births  3        Obstetric Comments  Son passed away May 20, 2010        Family History  Problem Relation Age of Onset  . Diabetes Mother   . Hypertension Mother   . Kidney disease Mother   . Cancer Father     Social History   Tobacco Use  . Smoking status: Former Smoker    Types: Cigars    Quit date: 06/25/2019    Years since quitting: 0.6  . Smokeless tobacco: Never Used  Substance Use Topics  . Alcohol use: No  . Drug use: No    Home Medications Prior to Admission medications   Medication Sig Start Date End Date Taking? Authorizing Provider  Blood Pressure Monitoring (BLOOD PRESSURE CUFF) MISC 1 Device by Does not apply route once a week. 07/08/19   Shelly Bombard, MD  enalapril (VASOTEC) 5 MG tablet Take 1 tablet (5 mg total) by mouth daily. 01/23/20   Constant, Peggy, MD  metroNIDAZOLE (FLAGYL) 500 MG tablet Take 1 tablet (500 mg total) by mouth 2 (two) times daily. 02/22/20   Suzy Bouchard, PA-C  Prenatal MV-Min-FA-Omega-3 (PRENATAL GUMMIES/DHA & FA) 0.4-32.5 MG CHEW Chew 3 tablets by mouth daily. 06/16/19   Shelly Bombard, MD    Allergies    Patient has no known allergies.  Review of Systems   Review of Systems  Constitutional: Negative for chills and fever.  Gastrointestinal: Negative for abdominal pain, diarrhea, nausea and vomiting.  Genitourinary: Positive for vaginal bleeding. Negative for dysuria, vaginal discharge and vaginal pain.    Neurological: Negative for dizziness and light-headedness.  All other systems reviewed and are negative.   Physical Exam Updated Vital Signs BP (!) 142/111 (BP Location: Right Arm)   Pulse 65   Temp 98.2 F (36.8 C) (Oral)   Resp 15   Ht 5' 4.5" (1.638 m)   Wt 83.6 kg   SpO2 100%   BMI 31.15 kg/m   Physical Exam Vitals and nursing note reviewed. Exam conducted with a chaperone present.  Constitutional:      General: She is not in acute distress.    Appearance: She is not ill-appearing.  HENT:     Head: Normocephalic.  Eyes:     Pupils: Pupils are equal, round, and reactive to light.  Cardiovascular:     Rate and Rhythm: Normal rate and regular rhythm.     Pulses: Normal pulses.     Heart sounds: Normal heart sounds. No murmur. No friction rub. No gallop.   Pulmonary:     Effort: Pulmonary effort is normal.     Breath sounds: Normal breath sounds.  Abdominal:     General: Abdomen is flat. Bowel sounds are normal. There is no distension.     Palpations: Abdomen is soft.     Tenderness: There is no abdominal tenderness. There is no guarding or rebound.     Comments: Abdomen soft, nondistended, nontender to palpation in all quadrants without guarding or peritoneal signs. No rebound.   Genitourinary:    Exam position: Supine.     Comments: Mild to moderate amount of blood in vaginal vault.  Closed os with blood. No vaginal discharge. Bimanual not performed given recent delivery.  Musculoskeletal:     Cervical back: Neck supple.     Comments: Able to move all 4 extremities without difficulty. No lower extremity edema.   Skin:    General: Skin is warm and dry.  Neurological:     General: No focal deficit present.     Mental Status: She is alert.  Psychiatric:        Mood and Affect: Mood normal.        Behavior: Behavior normal.     ED Results / Procedures / Treatments   Labs (all labs ordered are listed, but only abnormal results are displayed) Labs Reviewed   WET PREP, GENITAL - Abnormal; Notable for the following components:      Result Value   Clue Cells Wet Prep HPF POC PRESENT (*)    WBC, Wet Prep HPF POC MANY (*)    All other components within normal limits  CBC WITH DIFFERENTIAL/PLATELET - Abnormal; Notable for the following components:   RDW 17.6 (*)    All other components within normal limits  URINALYSIS, ROUTINE W REFLEX MICROSCOPIC - Abnormal; Notable for the following  components:   Color, Urine AMBER (*)    APPearance CLOUDY (*)    Specific Gravity, Urine >1.030 (*)    Hgb urine dipstick LARGE (*)    Ketones, ur 15 (*)    Protein, ur 30 (*)    All other components within normal limits  URINALYSIS, MICROSCOPIC (REFLEX) - Abnormal; Notable for the following components:   Bacteria, UA RARE (*)    All other components within normal limits  PREGNANCY, URINE  BASIC METABOLIC PANEL  GC/CHLAMYDIA PROBE AMP ([place]) NOT AT [facility]    EKG None  Radiology No results found.  Procedures Procedures (including critical care time)  Medications Ordered in ED Medications - No data to display  ED Course  I have reviewed the triage vital signs and the nursing notes.  Pertinent labs & imaging results that were available during my care of the patient were reviewed by me and considered in my medical decision making (see chart for details).  Clinical Course as of Feb 21 1446  Sun Feb 22, 2020  1251 BP(!): 129/105 [CA]    Clinical Course User Index [CA] Karie Kirks   MDM Rules/Calculators/A&P                     34 year old female presents to the ED due to heavy menstrual cycle that has recently worsened over the past day.  Recently had a C-section on 12/27/2019 and this is her first menstrual cycle since giving birth.  Admits to past history of heavy menstrual cycles.  Denies lightheadedness and dizziness.  Stable vitals.  Patient no acute distress and non-ill-appearing.  Abdomen soft, nondistended, and nontender.   C-section scar well-healing with no signs of infection. Will obtain routine labs to check hemoglobin. UA and wet prep to rule out infection. Pelvic exam reassuring with mild to moderate amount of bleeding in vaginal vault. No signs of vaginal injury.   CBC reassuring with no leukocytosis and normal hemoglobin at 14.4.  BMP unremarkable with normal renal function and no electrolyte derangements.  UA significant for hematuria likely due to vaginal bleeding.  No signs of infection.  Wet prep positive for clue cells.  Gonorrhea/chlamydia pending.  Will treat for BV with flagyl.  Advised patient to call OB/GYN tomorrow for further evaluation of vaginal bleeding.  Suspect bleeding related to normal menses that is slightly heavier. Strict ED precautions discussed with patient. Patient states understanding and agrees to plan. Patient discharged home in no acute distress and stable vitals.  Discussed case with Dr. Tamera Punt who agrees with assessment and plan.  Final Clinical Impression(s) / ED Diagnoses Final diagnoses:  Vaginal bleeding  BV (bacterial vaginosis)    Rx / DC Orders ED Discharge Orders         Ordered    metroNIDAZOLE (FLAGYL) 500 MG tablet  2 times daily     02/22/20 1446           Suzy Bouchard, PA-C 02/22/20 1452    Malvin Johns, MD 02/26/20 [phone]

## 2020-02-22 NOTE — Discharge Instructions (Addendum)
As discussed, your labs are reassuring.  Your hemoglobin is normal.  Your wet prep showed clue cells which is indicative of bacterial vaginosis.  I am sending you home with an antibiotic.  Take as prescribed.  Do not drink alcohol while on the antibiotic.  Please call your OB/GYN tomorrow for further evaluation of vaginal bleeding.  Return to the ER for new or worsening symptoms.

## 2020-02-24 ENCOUNTER — Telehealth: Payer: Self-pay | Admitting: *Deleted

## 2020-02-24 LAB — GC/CHLAMYDIA PROBE AMP (~~LOC~~) NOT AT ARMC
Chlamydia: NEGATIVE
Comment: NEGATIVE
Comment: NORMAL
Neisseria Gonorrhea: NEGATIVE

## 2020-02-24 NOTE — Telephone Encounter (Signed)
Pt called with questions after recent ED visit. Return call to pt.  Pt states that she was seen at ED due to heavy cycle. Pt states this was her first cycle after having baby in Feb.  Pt states that her bleeding is better today, not heavy. Pt  Made aware that first few cycles after birth may be heavier than expected. Advised to monitor cycles and if this continues she may need appt to discuss. Pt made aware that she may call the office anytime with questions or be seen at hospital if she saturates more that 1pad/hour.   Pt states understanding and has no other questions.

## 2020-06-24 ENCOUNTER — Ambulatory Visit (INDEPENDENT_AMBULATORY_CARE_PROVIDER_SITE_OTHER): Payer: Medicaid Other

## 2020-06-24 ENCOUNTER — Other Ambulatory Visit: Payer: Self-pay

## 2020-06-24 VITALS — BP 133/88 | HR 93 | Ht 64.0 in | Wt 189.0 lb

## 2020-06-24 DIAGNOSIS — R300 Dysuria: Secondary | ICD-10-CM

## 2020-06-24 LAB — POCT URINALYSIS DIPSTICK
Bilirubin, UA: NEGATIVE
Glucose, UA: NEGATIVE
Ketones, UA: POSITIVE
Nitrite, UA: NEGATIVE
Protein, UA: POSITIVE — AB
Spec Grav, UA: 1.025 (ref 1.010–1.025)
Urobilinogen, UA: 0.2 E.U./dL
pH, UA: 5 (ref 5.0–8.0)

## 2020-06-24 NOTE — Progress Notes (Signed)
SUBJECTIVE: Danielle Short is a 34 y.o. female who complains of urinary frequency, urgency and dysuria x 3 days, without flank pain, fever, chills, or abnormal vaginal discharge or bleeding.   OBJECTIVE: Appears well, in no apparent distress.  Vital signs are normal. Urine dipstick shows positive for protein, positive for leukocytes and positive for ketones.    ASSESSMENT: Dysuria  PLAN: Treatment per orders.  Call or return to clinic prn if these symptoms worsen or fail to improve as anticipated.

## 2020-06-26 LAB — URINE CULTURE

## 2020-06-29 ENCOUNTER — Telehealth: Payer: Self-pay

## 2020-06-29 NOTE — Telephone Encounter (Signed)
Returned call and discussed results

## 2020-08-27 ENCOUNTER — Encounter (HOSPITAL_COMMUNITY): Payer: Self-pay

## 2020-08-27 ENCOUNTER — Other Ambulatory Visit: Payer: Self-pay

## 2020-08-27 ENCOUNTER — Ambulatory Visit (HOSPITAL_COMMUNITY)
Admission: EM | Admit: 2020-08-27 | Discharge: 2020-08-27 | Disposition: A | Discharge status: Home / Self Care | Payer: Medicaid Other

## 2020-08-27 DIAGNOSIS — S8391XA Sprain of unspecified site of right knee, initial encounter: Secondary | ICD-10-CM

## 2020-08-27 DIAGNOSIS — M25561 Pain in right knee: Secondary | ICD-10-CM

## 2020-08-27 NOTE — ED Provider Notes (Signed)
[place]   [id number] 08/27/20 Arrival Time: 4235  TI:RWERX PAIN  SUBJECTIVE: History from: patient. Danielle Short is a 34 y.o. female complains of right knee pain that began about 2 weeks ago.  She reports the pain is intermittent.  Reports that she has not taken OTC medications for this.  She reports pain in the medial aspect of the knee superior to the patella on the right knee. Describes the pain as intermittent and achy in character. Has not tried OTC medications without relief.  Symptoms are made worse with activity.  Denies similar symptoms in the past.  Denies fever, chills, erythema, ecchymosis, weakness, numbness and tingling, saddle paresthesias, loss of bowel or bladder function.      ROS: As per HPI.  All other pertinent ROS negative.     Past Medical History:  Diagnosis Date  . Anemia   . Chronic hypertension with superimposed preeclampsia 12/22/2019  . Hypertension during pregnancy  . Pre-eclampsia added to pre-existing hypertension 12/10/2019   Will need delivery at 37 weeks  . Pregnancy affected by fetal growth restriction   . Pregnancy induced hypertension   . Uterine fibroids affecting pregnancy    Past Surgical History:  Procedure Laterality Date  . CESAREAN SECTION    . CESAREAN SECTION WITH BILATERAL TUBAL LIGATION N/A 12/27/2019   Procedure: CESAREAN SECTION WITH BILATERAL TUBAL LIGATION;  Surgeon: Aletha Halim, MD;  Location: [facility];  Service: Obstetrics;  Laterality: N/A;   No Known Allergies No current facility-administered medications on file prior to encounter.   Current Outpatient Medications on File Prior to Encounter  Medication Sig Dispense Refill  . Blood Pressure Monitoring (BLOOD PRESSURE CUFF) MISC 1 Device by Does not apply route once a week. (Patient not taking: Reported on 06/24/2020) 1 each 0  . enalapril (VASOTEC) 5 MG tablet Take 1 tablet (5 mg total) by mouth daily. (Patient not taking: Reported on 06/24/2020) 30 tablet 6  .  metroNIDAZOLE (FLAGYL) 500 MG tablet Take 1 tablet (500 mg total) by mouth 2 (two) times daily. (Patient not taking: Reported on 06/24/2020) 14 tablet 0  . Prenatal MV-Min-FA-Omega-3 (PRENATAL GUMMIES/DHA & FA) 0.4-32.5 MG CHEW Chew 3 tablets by mouth daily. (Patient not taking: Reported on 06/24/2020) 90 tablet 12   Social History   Socioeconomic History  . Marital status: Married    Spouse name: Not on file  . Number of children: 1  . Years of education: Not on file  . Highest education level: Not on file  Occupational History  . Occupation: ross  Tobacco Use  . Smoking status: Former Smoker    Types: Cigars    Quit date: 06/25/2019    Years since quitting: 1.1  . Smokeless tobacco: Never Used  Vaping Use  . Vaping Use: Never used  Substance and Sexual Activity  . Alcohol use: No  . Drug use: No  . Sexual activity: Yes    Birth control/protection: None  Other Topics Concern  . Not on file  Social History Narrative  . Not on file   Social Determinants of Health   Financial Resource Strain:   . Difficulty of Paying Living Expenses: Not on file  Food Insecurity:   . Worried About Charity fundraiser in the Last Year: Not on file  . Ran Out of Food in the Last Year: Not on file  Transportation Needs:   . Lack of Transportation (Medical): Not on file  . Lack of Transportation (Non-Medical): Not on file  Physical Activity:   . Days of Exercise per Week: Not on file  . Minutes of Exercise per Session: Not on file  Stress:   . Feeling of Stress : Not on file  Social Connections:   . Frequency of Communication with Friends and Family: Not on file  . Frequency of Social Gatherings with Friends and Family: Not on file  . Attends Religious Services: Not on file  . Active Member of Clubs or Organizations: Not on file  . Attends Archivist Meetings: Not on file  . Marital Status: Not on file  Intimate Partner Violence:   . Fear of Current or Ex-Partner: Not on file  .  Emotionally Abused: Not on file  . Physically Abused: Not on file  . Sexually Abused: Not on file   Family History  Problem Relation Age of Onset  . Diabetes Mother   . Hypertension Mother   . Kidney disease Mother   . Cancer Father     OBJECTIVE:  Vitals:   08/27/20 1555  BP: (!) 122/55  Pulse: 82  Resp: 16  Temp: 98.8 F (37.1 C)  TempSrc: Oral  SpO2: 100%    General appearance: ALERT; in no acute distress.  Head: NCAT Lungs: Normal respiratory effort CV: pulses 2+ bilaterally. Cap refill < 2 seconds Musculoskeletal:  Inspection: Skin warm, dry, clear and intact without obvious erythema, effusion, or ecchymosis.  Palpation: Medial aspect of the right knee tender to palpation ROM: Limited ROM active and passive to right knee Skin: warm and dry Neurologic: Ambulates without difficulty; Sensation intact about the upper/ lower extremities Psychological: alert and cooperative; normal mood and affect  DIAGNOSTIC STUDIES:  No results found.   ASSESSMENT & PLAN:  1. Acute pain of right knee   2. Sprain of right knee, unspecified ligament, initial encounter    Brace applied in office Continue conservative management of rest, ice, and gentle stretches Take ibuprofen as needed for pain relief (may cause abdominal discomfort, ulcers, and GI bleeds avoid taking with other NSAIDs)  Follow up with PCP if symptoms persist Return or go to the ER if you have any new or worsening symptoms (fever, chills, chest pain, abdominal pain, changes in bowel or bladder habits, pain radiating into lower legs)   Reviewed expectations re: course of current medical issues. Questions answered. Outlined signs and symptoms indicating need for more acute intervention. Patient verbalized understanding. After Visit Summary given.       Faustino Congress, NP 08/27/20 1609

## 2020-08-27 NOTE — Discharge Instructions (Addendum)
Take ibuprofen as needed.  Rest and elevate your knee.  Apply ice packs 2-3 times a day for up to 20 minutes each.  Wear the brace as needed for comfort.    We cannot rule out a soft tissue injury in the office today, I think with the popping and clicking along with where your swelling is located that you may have a pulled or torn meniscus.  Follow up with your primary care provider or an orthopedist if you symptoms continue or worsen;  Or if you develop new symptoms, such as numbness, tingling, or weakness.

## 2020-08-27 NOTE — ED Triage Notes (Signed)
Pt present right knee pain with swelling. Pt states that she is having issues with bending her knee, put pressure on her knee. Pt denies any injury to the knee. Symptom started a few weeks ago.

## 2020-11-01 ENCOUNTER — Encounter: Payer: Self-pay | Admitting: General Practice

## 2021-01-16 IMAGING — US US MFM OB DETAIL+14 WK
1 series · 12 of 28 positions shown · non-contrast
Comparison: none

[Series 1: us mfm ob detail+14 wk · 106 acquisitions, 12 frames shown]
[im 4/106]
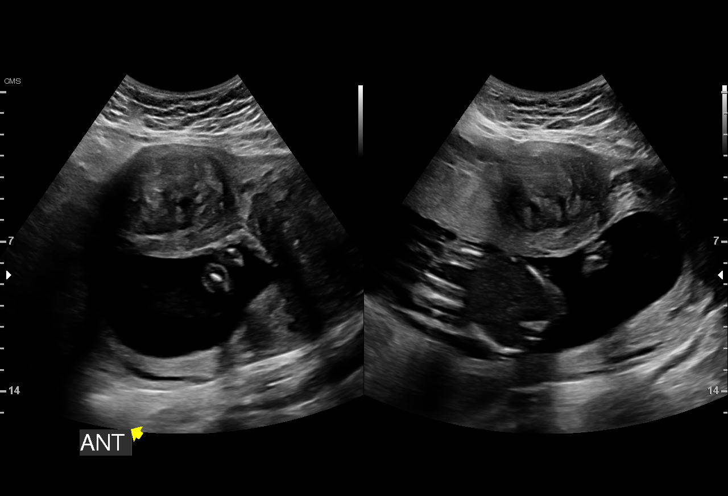
[im 12/106]
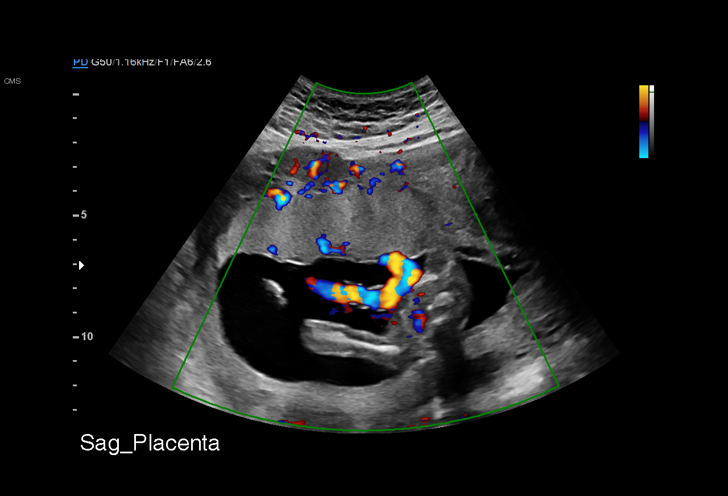
[im 20/106]
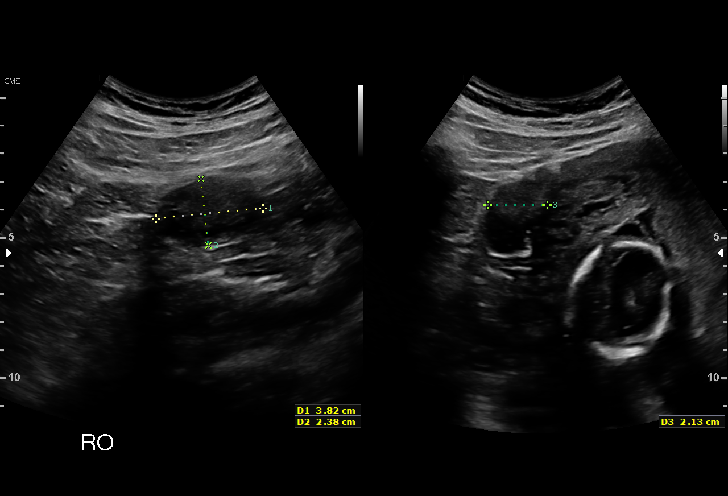
[im 32/106]
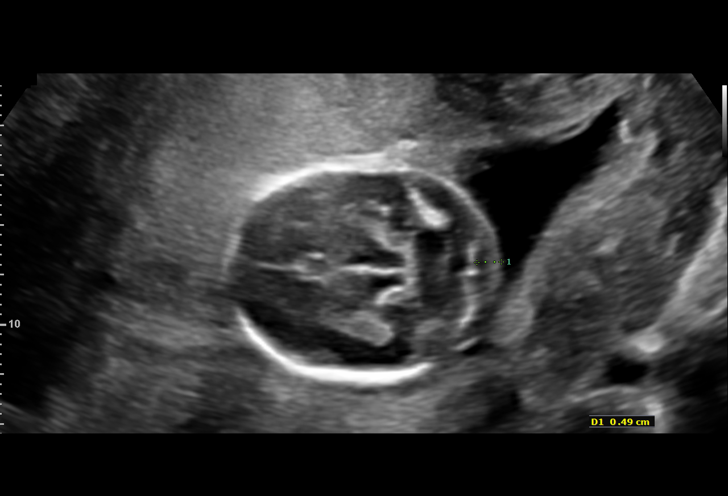
[im 39/106]
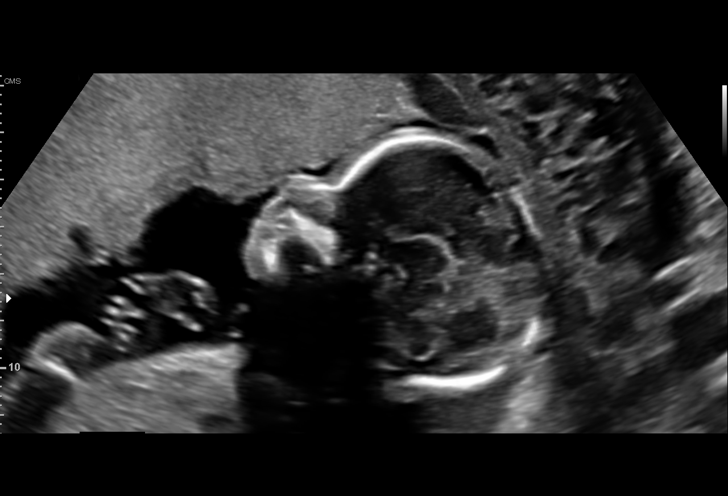
[im 47/106]
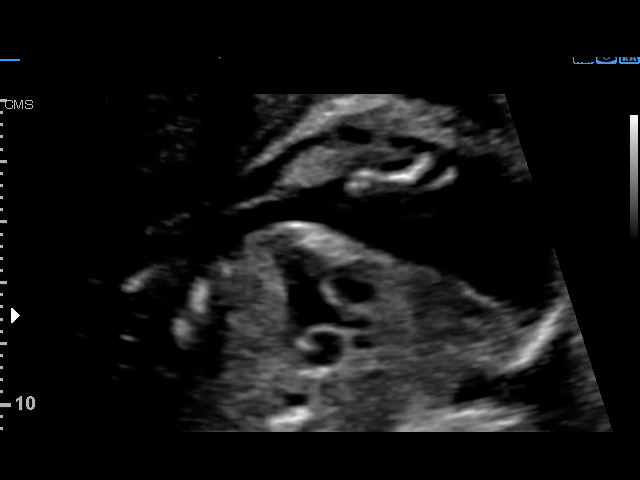
[im 59/106]
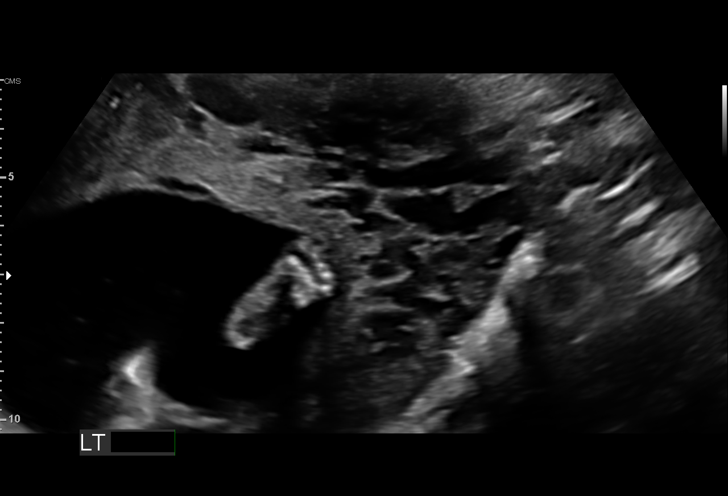
[im 67/106]
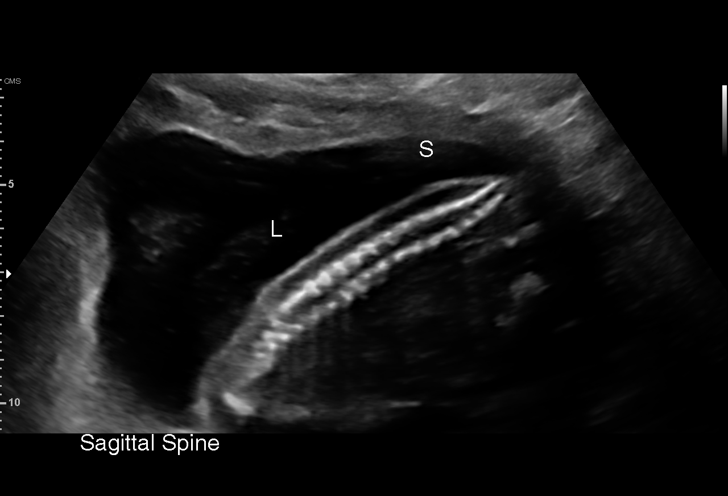
[im 74/106]
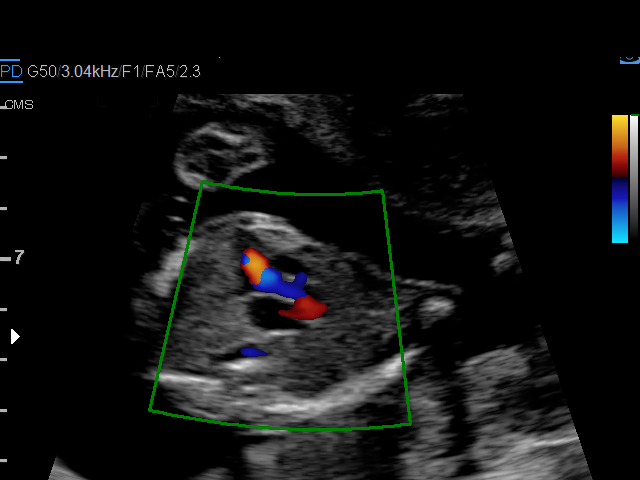
[im 86/106]
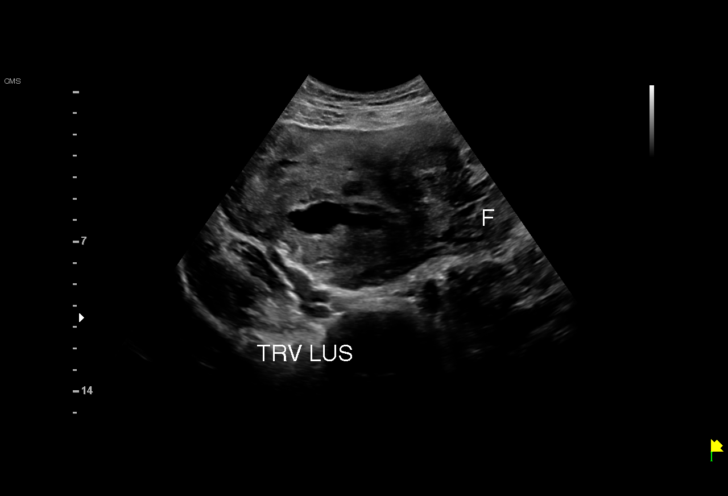
[im 94/106]
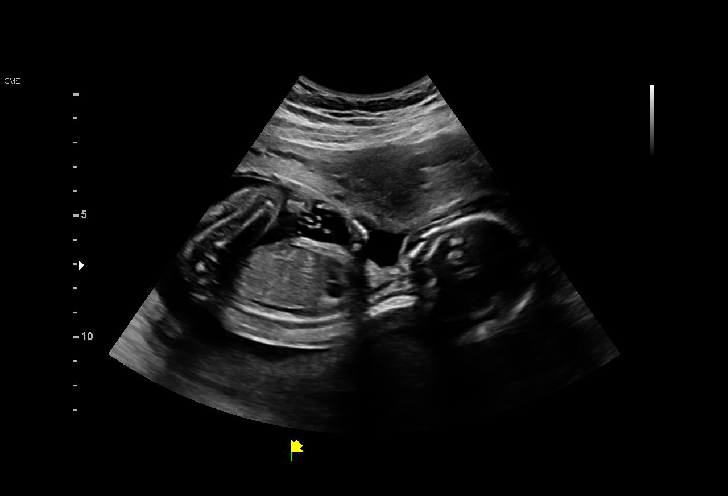
[im 102/106]
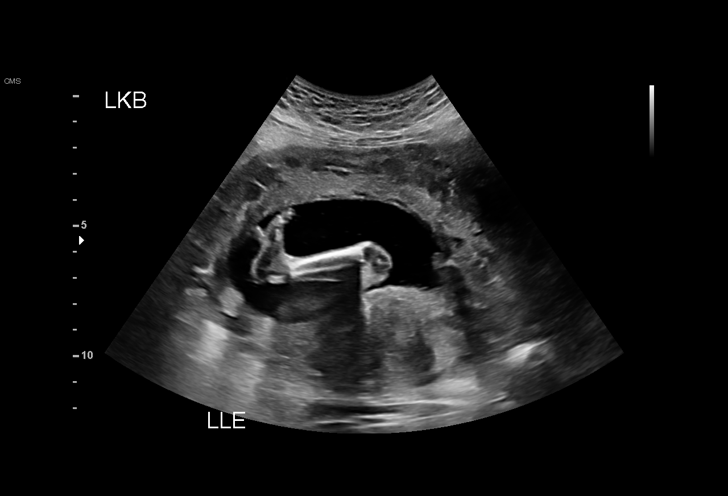

[12 of 28 positions shown; findings below may reference images not displayed]

----------------------------------------------------------------------

 ----------------------------------------------------------------------
Indications

  Antenatal screening for malformations
  Hypertension - Chronic/Pre-existing
  Uterine fibroids affecting pregnancy in        O34.12,
  second trimester, antepartum
  Poor obstetric history: Previous preterm
  delivery, antepartum (27 weeks)
  Previous cesarean delivery, antepartum x 2
  Poor obstetric history: Previous
  preeclampsia / eclampsia/gestational HTN
  19 weeks gestation of pregnancy
 ----------------------------------------------------------------------
Fetal Evaluation

 Num Of Fetuses:         1
 Fetal Heart Rate(bpm):  153
 Cardiac Activity:       Observed
 Presentation:           Cephalic
 Placenta:               Anterior
 P. Cord Insertion:      Visualized

 Amniotic Fluid
 AFI FV:      Within normal limits

                             Largest Pocket(cm)

Biometry

 BPD:      42.4  mm     G. Age:  18w 6d         37  %    CI:        68.04   %    70 - 86
                                                         FL/HC:      19.2   %    16.1 -
 HC:      164.4  mm     G. Age:  19w 1d         44  %    HC/AC:      1.11        1.09 -
 AC:      147.9  mm     G. Age:  20w 0d         76  %    FL/BPD:     74.3   %
 FL:       31.5  mm     G. Age:  19w 6d         67  %    FL/AC:      21.3   %    20 - 24
 HUM:      31.1  mm     G. Age:  20w 2d         82  %
 CER:      19.7  mm     G. Age:  18w 6d         43  %
 NFT:       4.8  mm
 CM:        4.4  mm

 Est. FW:     315  gm    0 lb 11 oz      83  %
OB History

 Gravidity:    4         Term:   1        Prem:   1        SAB:   0
 TOP:          1       Ectopic:  0        Living: 1
Gestational Age

 LMP:           19w 1d        Date:  05/08/19                 EDD:   02/12/20
 U/S Today:     19w 3d                                        EDD:   02/10/20
 Best:          19w 1d     Det. By:  LMP  (05/08/19)          EDD:   02/12/20
Anatomy

 Cranium:               Appears normal         LVOT:                   Appears normal
 Cavum:                 Appears normal         Aortic Arch:            Not well visualized
 Ventricles:            Appears normal         Ductal Arch:            Not well visualized
 Choroid Plexus:        Appears normal         Diaphragm:              Not well visualized
 Cerebellum:            Appears normal         Stomach:                Appears normal, left
                                                                       sided
 Posterior Fossa:       Appears normal         Abdomen:                Appears normal
 Nuchal Fold:           Appears normal         Abdominal Wall:         Appears nml (cord
                                                                       insert, abd wall)
 Face:                  Appears normal         Cord Vessels:           Appears normal (3
                        (orbits and profile)                           vessel cord)
 Lips:                  Appears normal         Kidneys:                Appear normal
 Palate:                Appears normal         Bladder:                Appears normal
 Thoracic:              Appears normal         Spine:                  Appears normal
 Heart:                 Appears normal         Upper Extremities:      Appears normal
                        (4CH, axis, and
                        situs)
 RVOT:                  Appears normal         Lower Extremities:      Appears normal

 Other:  Nasal bone visualized. Heels/feet and open hands/5th digits
         visualized.
Cervix Uterus Adnexa

 Cervix
 Length:            4.2  cm.
 Normal appearance by transabdominal scan.

 Uterus
 Multiple fibroids noted, see table below.

 Left Ovary
 Within normal limits.

 Right Ovary
 Within normal limits.

 Cul De Sac
 No free fluid seen.
 Adnexa
 No abnormality visualized.
Myomas

  Site                     L(cm)      W(cm)      D(cm)      Location
  Anterior Left            5.2        5          5
  Anterior
  Right                    8
 ----------------------------------------------------------------------

  Blood Flow                 RI        PI       Comments

 ----------------------------------------------------------------------
Impression

 Ms. Micelli, Gerhard U Ingrid4 3EEEE at 19-weeks' gestation is here for fetal
 anatomy scan.  Obstetric history is significant for a preterm
 cesarean delivery at 27 weeks gestation in 0606 of a male
 infant.  Her pregnancy was complicated by preeclampsia.
 Unfortunately her son died at 10 months of age because of
 prematurity complications.  In 9119, she had a term repeat
 cesarean delivery of a female infant and her daughter is in
 good health.  She has a diagnosis of chronic hypertension
 and takes low-dose aspirin prophylaxis.

 On cell-free fetal DNA screening, the risks of fetal
 aneuploidies are not increased.

 We performed fetal anatomy scan. No makers of
 aneuploidies or fetal structural defects are seen. Fetal
 biometry is consistent with her previously-established dates.
 Amniotic fluid is normal and good fetal activity is seen.
 Placenta is anterior and there is no evidence of previa or
 accreta.  Patient understands the limitations of ultrasound in
 detecting fetal anomalies.
 Multiple myomas are seen (measurements above).  Patient
 does not have any symptoms pertaining to the myomas.
 Blood pressure at our office is 140/93 mmHg.
Recommendations

 -An appointment was made for her to return in 4 weeks for
 completion of fetal anatomy.
 -Fetal growth assessment every 4 weeks.
                 Bracy, Didi

## 2021-04-20 IMAGING — US US MFM OB FOLLOW-UP
2 series · 15 of 28 positions shown · non-contrast
Comparison: none

[Series 1: us mfm ob follow-up · 63 acquisitions, 14 frames shown (1 of 2)]
[im 1/63]
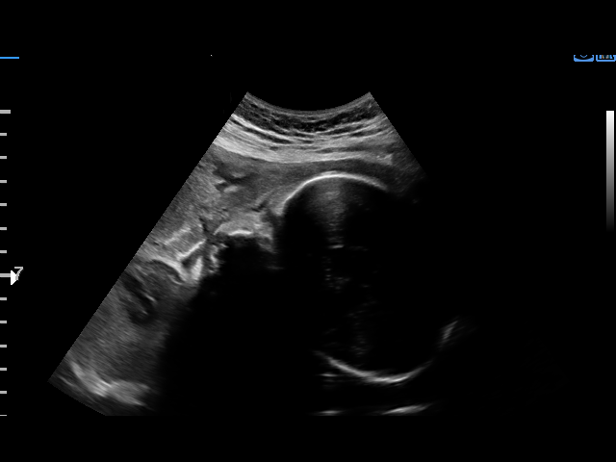
[im 5/63]
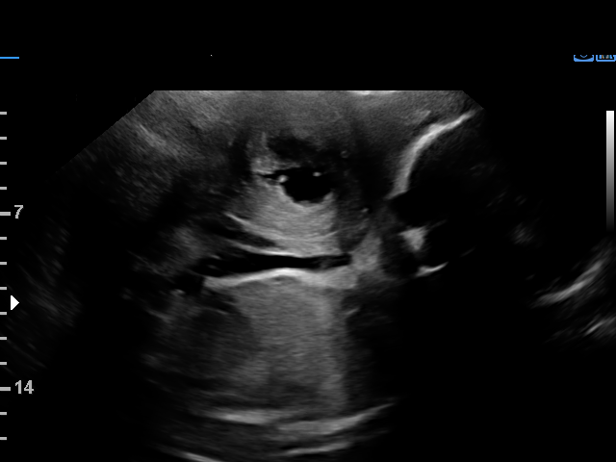
[im 10/63]
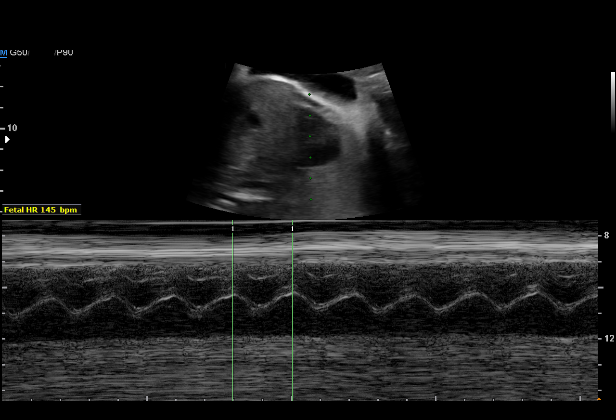
[im 15/63]
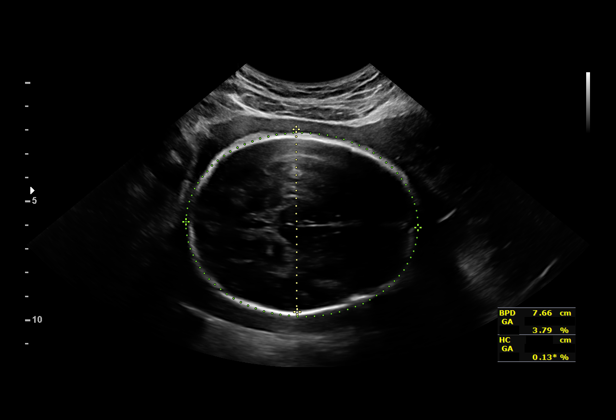
[im 20/63]
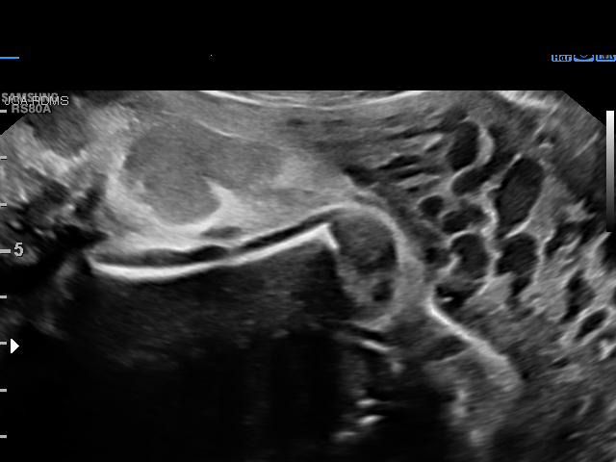
[im 24/63]
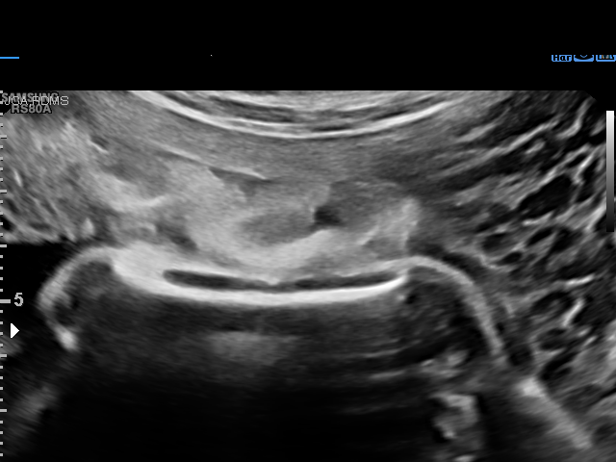
[im 29/63]
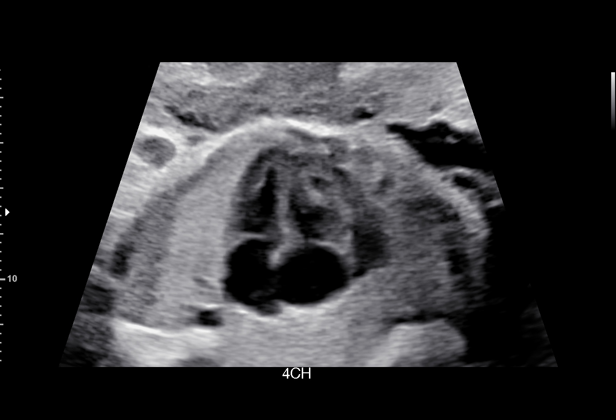
[im 34/63]
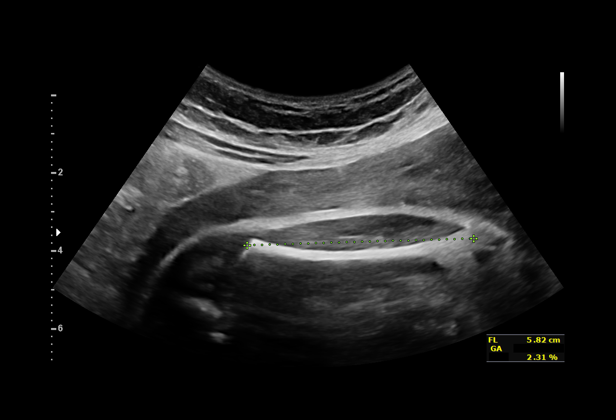
[im 36/63]
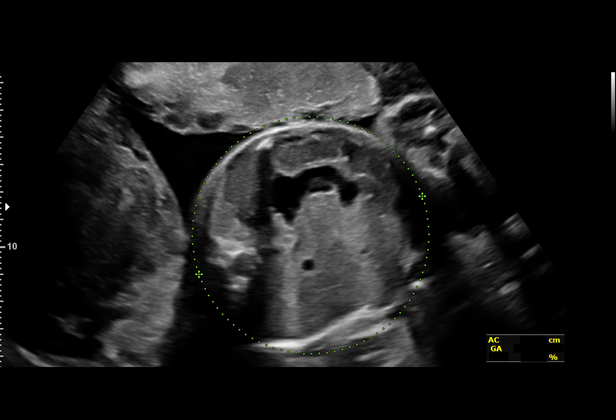
[im 41/63]
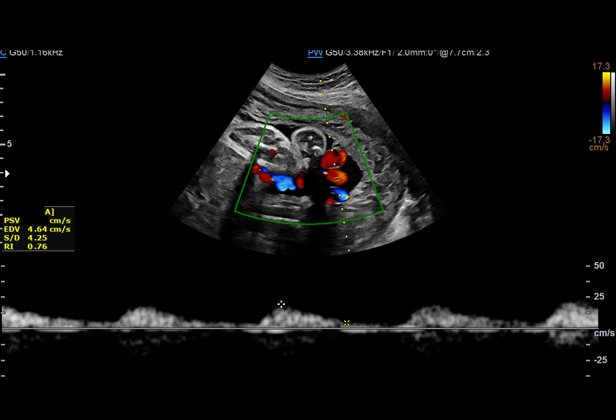
[im 46/63]
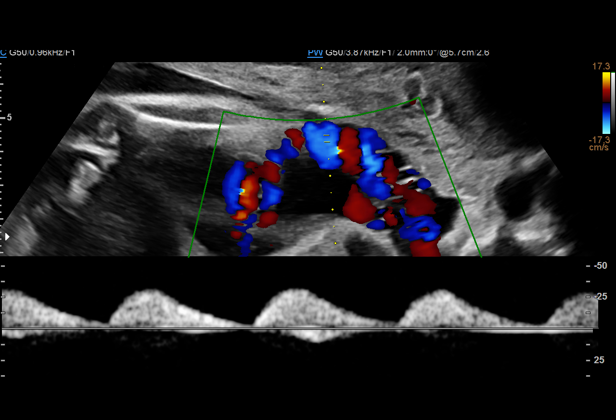
[im 51/63]
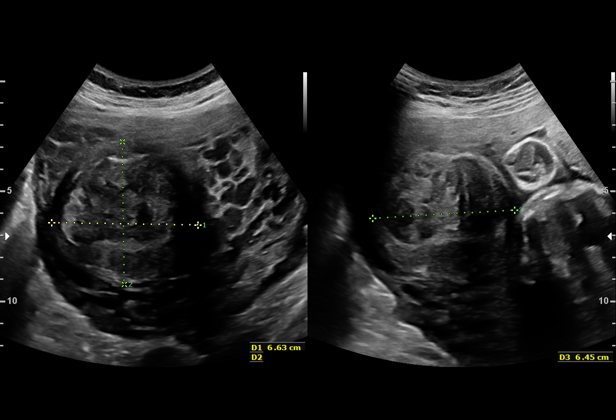
[im 55/63]
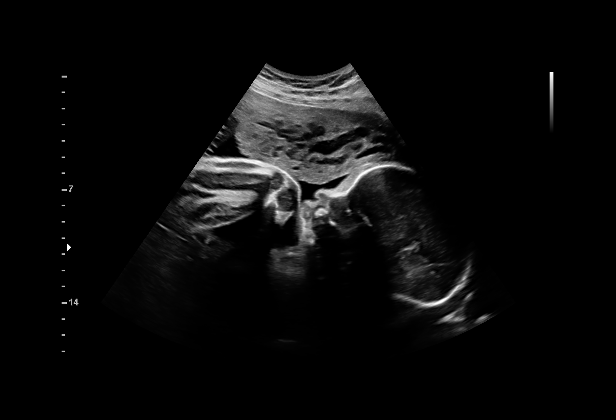
[im 60/63]
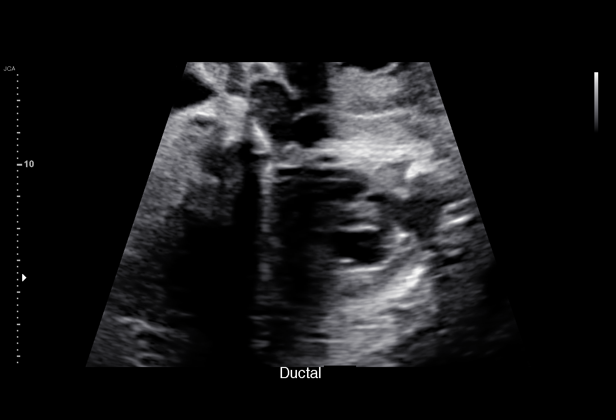

[Series 3: us mfm ob follow-up · 1 of 2 slices shown (2 of 2)]
[im 1/2]
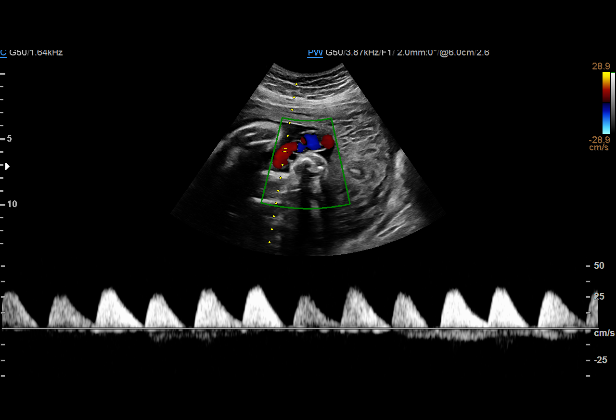

[15 of 28 positions shown; findings below may reference images not displayed]

Attending:        Lorraine Jim        Secondary Phy.:   [facility] MAU/Triage

     W/NONSTRESS
 ----------------------------------------------------------------------

 ----------------------------------------------------------------------
Indications

  Hypertension - Chronic with superimposed
  preeclampsia
  Maternal care for known or suspected poor
  fetal growth, third trimester, not applicable or
  unspecified IUGR
  Previous cesarean delivery, antepartum x 2
  Poor obstetric history: Previous preterm
  delivery, antepartum (27 weeks)
  Poor obstetric history: Previous
  preeclampsia / eclampsia/gestational HTN
  Uterine fibroids affecting pregnancy in third  O34.13,
  trimester, antepartum
  32 weeks gestation of pregnancy
 ----------------------------------------------------------------------
Fetal Evaluation

 Num Of Fetuses:         1
 Fetal Heart Rate(bpm):  145
 Cardiac Activity:       Observed
 Presentation:           Cephalic
 Placenta:               Anterior
 P. Cord Insertion:      Previously Visualized

 Amniotic Fluid
 AFI FV:      Within normal limits
 AFI Sum(cm)     %Tile       Largest Pocket(cm)
 13.18           41

 RUQ(cm)       RLQ(cm)       LUQ(cm)        LLQ(cm)

Biophysical Evaluation

 Amniotic F.V:   Within normal limits       F. Tone:        Observed
 F. Movement:    Observed                   N.S.T:          Reactive
 F. Breathing:   Observed                   Score:          [DATE]
Biometry

 BPD:      75.2  mm     G. Age:  30w 1d          2  %    CI:        74.38   %    70 - 86
                                                         FL/HC:      21.3   %    19.9 -
 HC:      276.8  mm     G. Age:  30w 2d        < 1  %    HC/AC:      1.02        0.96 -
 AC:      272.1  mm     G. Age:  31w 2d         16  %    FL/BPD:     78.3   %    71 - 87
 FL:       58.9  mm     G. Age:  30w 5d          5  %    FL/AC:      21.6   %    20 - 24
 HUM:      52.7  mm     G. Age:  30w 5d         18  %

 LV:        2.2  mm

 Est. FW:    7442  gm    3 lb 11 oz       6  %
OB History

 Gravidity:    4         Term:   1        Prem:   1        SAB:   0
 TOP:          1       Ectopic:  0        Living: 1
Gestational Age

 LMP:           32w 4d        Date:  05/08/19                 EDD:   02/12/20
 U/S Today:     30w 4d                                        EDD:   02/26/20
 Best:          32w 4d     Det. By:  LMP  (05/08/19)          EDD:   02/12/20
Anatomy

 Cranium:               Appears normal         LVOT:                   Previously seen
 Cavum:                 Appears normal         Aortic Arch:            Not well visualized
 Ventricles:            Appears normal         Ductal Arch:            Appears normal
 Choroid Plexus:        Previously seen        Diaphragm:              Appears normal
 Cerebellum:            Previously seen        Stomach:                Appears normal, left
                                                                       sided
 Posterior Fossa:       Previously seen        Abdomen:                Appears normal
 Nuchal Fold:           Not applicable (>20    Abdominal Wall:         Appears nml (cord
                        wks GA)                                        insert, abd wall)
 Face:                  Orbits and profile     Cord Vessels:           Previously seen
                        previously seen
 Lips:                  Previously seen        Kidneys:                Previously seen
 Palate:                Previously seen        Bladder:                Appears normal
 Thoracic:              Appears normal         Spine:                  Previously seen
 Heart:                 Appears normal         Upper Extremities:      Previously seen
                        (4CH, axis, and
                        situs)
 RVOT:                  Previously seen        Lower Extremities:      Previously seen
Doppler - Fetal Vessels

 Umbilical Artery
  S/D     %tile                                            ADFV    RDFV
 4.25    > 97.5                                              Yes      No
Myomas

  Site                     L(cm)      W(cm)      D(cm)      Location
  Anterior                 5.35       5
  Anterior LT
  Upper Rt
  Lower Rt
 ----------------------------------------------------------------------

  Blood Flow                 RI        PI       Comments

 ----------------------------------------------------------------------
Impression

 Patient with chronic hypertension return for fetal growth
 assessment and antenatal testing.  She takes labetalol 400
 mg 3 times a day.  Doses of labetalol was increased at her
 prenatal visit last week.  Patient reports she noted systolic
 blood pressures over 160 mmHg at home last week.  Her
 blood pressure prenatal visit on 12/18/2019 was 143/93
 mmHg.  Today her blood pressures are 138/94 and 152/97
 mmHg.

 Patient has a diagnosis of chronic hypertension with
 superimposed preeclampsia.  Labs (last week) including
 platelets and liver enzymes are within normal range.  Urine
 protein creatinine ratio is increased (0.93).

 Patient does not have symptoms of severe headache or right
 upper quadrant pain or vaginal bleeding.  She reports
 occasional "floaters" in her vision.

 Obstetric history significant for 2 previous cesarean
 deliveries.

 On today's ultrasound, the estimated fetal weight is at the 6
 percentile.  Interval weight gain is 504 g.  Head
 circumference measurement is at between -2 1-1 SD (no
 evidence of microcephaly).  Amniotic fluid is normal and good
 fetal activity seen.  Umbilical artery Doppler showed
 intermittent absent end-diastolic flow.  Antenatal testing is,
 otherwise, reassuring. NST is reactive.  BPP [DATE].
 Multiple myomas are seen (patient does not have symptoms
 pertaining to the myomas).

 I discussed the following with the patient:
 Fetal growth restriction
 I explained ultrasound findings suggestive of fetal growth
 restriction.  I discussed the significance of umbilical artery
 Doppler study that showed severe fetal growth restriction.
 Most likely cause is chronic hypertension with superimposed
 preeclampsia.  I counseled her on the importance of frequent
 antenatal testing and delivery decisions will be based on
 abnormal Doppler studies.  If persistent or intermittent absent
 end-diastolic flow is seen, we would recommend delivery at
 34 weeks gestation.

 I discussed the benefit of antenatal corticosteroids for fetal
 pulmonary maturity.  Patient agreed to take Yukiyoshi.
 She received first dose of Yukiyoshi today and will be
 returning tomorrow for her second dose.

 Chronic hypertension with superimposed preeclampsia
 I discussed possible adverse maternal outcomes from
 uncontrolled blood pressure and preeclampsia.  I discussed
 symptoms of severe features of preeclampsia.  I offered her
 admission for management till, possibly, delivery.
 I discussed the importance of good blood pressure control to
 prevent adverse fetal and maternal outcomes.
 Consultation note in [REDACTED].
Recommendations

 -BPP and UA Doppler studies on [REDACTED]/[REDACTED].
 -Rhona second dose tomorrow.
 -Patient will discuss with the family and decide on inpatient
 management.
 -She will be checking blood pressures at home at least twice
 daily and understands the parameters.
                 Robson, Ruchi

## 2021-11-11 ENCOUNTER — Ambulatory Visit: Payer: Medicaid Other | Admitting: Family Medicine

## 2021-12-30 ENCOUNTER — Encounter: Payer: Self-pay | Admitting: Family Medicine

## 2021-12-30 ENCOUNTER — Ambulatory Visit (INDEPENDENT_AMBULATORY_CARE_PROVIDER_SITE_OTHER): Payer: BLUE CROSS/BLUE SHIELD | Admitting: Family Medicine

## 2021-12-30 ENCOUNTER — Other Ambulatory Visit: Payer: Self-pay

## 2021-12-30 VITALS — BP 133/76 | HR 95 | Temp 98.1°F | Resp 16 | Wt 201.4 lb

## 2021-12-30 DIAGNOSIS — R0683 Snoring: Secondary | ICD-10-CM

## 2021-12-30 DIAGNOSIS — Z23 Encounter for immunization: Secondary | ICD-10-CM | POA: Diagnosis not present

## 2021-12-30 DIAGNOSIS — Z7689 Persons encountering health services in other specified circumstances: Secondary | ICD-10-CM

## 2021-12-30 DIAGNOSIS — F32A Depression, unspecified: Secondary | ICD-10-CM

## 2021-12-30 DIAGNOSIS — F419 Anxiety disorder, unspecified: Secondary | ICD-10-CM | POA: Diagnosis not present

## 2021-12-30 MED ORDER — SERTRALINE HCL 50 MG PO TABS: 50.0000 mg | ORAL_TABLET | Freq: Every day | ORAL | 0 refills | Status: DC

## 2021-12-30 NOTE — Progress Notes (Signed)
Patient would like talk about her anxiety. Patient said that she would like a referral for sleep rest because of her snoring and not breathing per her husband

## 2021-12-30 NOTE — Progress Notes (Signed)
New Patient Office Visit  Subjective:  Patient ID: Tanaka Gillen, female    DOB: [birth year]  Age: 36 y.o. MRN: [id number]  CC:  Chief Complaint  Patient presents with   Establish Care   Annual Exam    HPI Zarra Geffert presents for to establish care. Patient reports that she has been fatigued. She has been snoring and wants a sleep study.Patient also reports increased social stressors.   Past Medical History:  Diagnosis Date   Anemia    Chronic hypertension with superimposed preeclampsia 12/22/2019   Hypertension during pregnancy   Pre-eclampsia added to pre-existing hypertension 12/10/2019   Will need delivery at 37 weeks   Pregnancy affected by fetal growth restriction    Pregnancy induced hypertension    Uterine fibroids affecting pregnancy     Past Surgical History:  Procedure Laterality Date   CESAREAN SECTION     CESAREAN SECTION WITH BILATERAL TUBAL LIGATION N/A 12/27/2019   Procedure: CESAREAN SECTION WITH BILATERAL TUBAL LIGATION;  Surgeon: Aletha Halim, MD;  Location: [facility];  Service: Obstetrics;  Laterality: N/A;    Family History  Problem Relation Age of Onset   Diabetes Mother    Hypertension Mother    Kidney disease Mother    Cancer Father     Social History   Socioeconomic History   Marital status: Married    Spouse name: Not on file   Number of children: 1   Years of education: Not on file   Highest education level: Not on file  Occupational History   Occupation: ross  Tobacco Use   Smoking status: Former    Types: Cigars    Quit date: 06/25/2019    Years since quitting: 2.5   Smokeless tobacco: Never  Vaping Use   Vaping Use: Never used  Substance and Sexual Activity   Alcohol use: No   Drug use: No   Sexual activity: Yes    Birth control/protection: None  Other Topics Concern   Not on file  Social History Narrative   Not on file   Social Determinants of Health   Financial Resource Strain: Not on file  Food Insecurity: Not on  file  Transportation Needs: Not on file  Physical Activity: Not on file  Stress: Not on file  Social Connections: Not on file  Intimate Partner Violence: Not on file    ROS Review of Systems  Constitutional:  Positive for fatigue.  Psychiatric/Behavioral:  Positive for sleep disturbance. Negative for self-injury and suicidal ideas. The patient is nervous/anxious.   All other systems reviewed and are negative.  Objective:   Today's Vitals: BP 133/76    Pulse 95    Temp 98.1 F (36.7 C) (Oral)    Resp 16    Wt 201 lb 6.4 oz (91.4 kg)    SpO2 96%    BMI 34.57 kg/m   Physical Exam Vitals and nursing note reviewed.  Constitutional:      General: She is not in acute distress. Cardiovascular:     Rate and Rhythm: Normal rate and regular rhythm.  Pulmonary:     Effort: Pulmonary effort is normal.     Breath sounds: Normal breath sounds.  Abdominal:     Palpations: Abdomen is soft.     Tenderness: There is no abdominal tenderness.  Neurological:     General: No focal deficit present.     Mental Status: She is alert and oriented to person, place, and time.  Psychiatric:  Mood and Affect: Mood normal.        Behavior: Behavior normal.    Assessment & Plan:   1. Snores Referral for sleep study  - Split night study; Future  2. Anxiety and depression Will prescribed zoloft 25 mg and monitor  3. Need for prophylactic vaccination and inoculation against influenza  - Flu Vaccine QUAD 24mo+IM (Fluarix, Fluzone & Alfiuria Quad PF)  4. Encounter to establish care     Outpatient Encounter Medications as of 12/30/2021  Medication Sig   Blood Pressure Monitoring (BLOOD PRESSURE CUFF) MISC 1 Device by Does not apply route once a week.   enalapril (VASOTEC) 5 MG tablet Take 1 tablet (5 mg total) by mouth daily.   metroNIDAZOLE (FLAGYL) 500 MG tablet Take 1 tablet (500 mg total) by mouth 2 (two) times daily.   Prenatal MV-Min-FA-Omega-3 (PRENATAL GUMMIES/DHA & FA) 0.4-32.5 MG  CHEW Chew 3 tablets by mouth daily.   sertraline (ZOLOFT) 50 MG tablet Take 1 tablet (50 mg total) by mouth daily.   No facility-administered encounter medications on file as of 12/30/2021.    Follow-up: No follow-ups on file.   Becky Sax, MD

## 2022-02-17 ENCOUNTER — Encounter: Payer: BLUE CROSS/BLUE SHIELD | Admitting: Family Medicine

## 2023-02-02 ENCOUNTER — Ambulatory Visit
Admission: EM | Admit: 2023-02-02 | Discharge: 2023-02-02 | Disposition: A | Discharge status: Home / Self Care | Payer: 59 | Attending: Family Medicine | Admitting: Family Medicine

## 2023-02-02 ENCOUNTER — Encounter: Payer: Self-pay | Admitting: Emergency Medicine

## 2023-02-02 DIAGNOSIS — Z20828 Contact with and (suspected) exposure to other viral communicable diseases: Secondary | ICD-10-CM | POA: Diagnosis not present

## 2023-02-02 DIAGNOSIS — Z87891 Personal history of nicotine dependence: Secondary | ICD-10-CM | POA: Insufficient documentation

## 2023-02-02 DIAGNOSIS — R0981 Nasal congestion: Secondary | ICD-10-CM | POA: Diagnosis present

## 2023-02-02 DIAGNOSIS — Z1152 Encounter for screening for COVID-19: Secondary | ICD-10-CM | POA: Diagnosis not present

## 2023-02-02 DIAGNOSIS — J069 Acute upper respiratory infection, unspecified: Secondary | ICD-10-CM

## 2023-02-02 NOTE — Discharge Instructions (Signed)
  You have been swabbed for COVID, and the test will result in the next 24 hours. Our staff will call you if positive. If the COVID test is positive, you should quarantine until you are fever free for 24 hours and you are starting to feel better, and then take added precautions for the next 5 days, such as physical distancing/wearing a mask and good hand hygiene/washing.  Continue taking the Tylenol cold and flu as needed.

## 2023-02-02 NOTE — ED Triage Notes (Signed)
Tuesday began having nasal drainage and congestion. Reports she regularly has seasonal allergies, and that her family was sick with the flu three weeks prior. States her eyes have been watering more frequently since this started. Also c/o sore throat, cough,  Denies body aches, headache, abdominal pain, N/V/D, CP, SOB, palpitations, wheezing. Has been taking benadryl but it's been making her sleepy. Took tylenol severe this morning and feels as though that also made her sleepy.

## 2023-02-02 NOTE — ED Provider Notes (Signed)
[facility]    CSN: [id number] Arrival date & time: 02/02/23  1032      History   Chief Complaint Chief Complaint  Patient presents with   Nasal Congestion    HPI Danielle Short is a 37 y.o. female.   HPI Here for nasal congestion and cough.  Symptoms have been going on since March 12.  No fever and no chills.  No nausea or vomiting or diarrhea.  Her throat is a little irritated.  She has been taking Benadryl thinking it is her allergies, but is not helping.  She is exposed to the flu, but that was 3 weeks ago.  This menstrual cycle was February 23.  Past Medical History:  Diagnosis Date   Anemia    Chronic hypertension with superimposed preeclampsia 12/22/2019   Hypertension during pregnancy   Pre-eclampsia added to pre-existing hypertension 12/10/2019   Will need delivery at 37 weeks   Pregnancy affected by fetal growth restriction    Pregnancy induced hypertension    Uterine fibroids affecting pregnancy     Patient Active Problem List   Diagnosis Date Noted   Post-operative state 02/10/2020   Previous cesarean section 12/27/2019   H/O tubal ligation 11/26/2019   H/O pre-eclampsia in prior pregnancy, currently pregnant 08/20/2019   Iron deficiency anemia during pregnancy 07/24/2019    Past Surgical History:  Procedure Laterality Date   CESAREAN SECTION     CESAREAN SECTION WITH BILATERAL TUBAL LIGATION N/A 12/27/2019   Procedure: CESAREAN SECTION WITH BILATERAL TUBAL LIGATION;  Surgeon: Aletha Halim, MD;  Location: [facility];  Service: Obstetrics;  Laterality: N/A;    OB History     Gravida  4   Para  3   Term  1   Preterm  2   AB  1   Living  2      SAB      IAB  1   Ectopic      Multiple  0   Live Births  3        Obstetric Comments  Son passed away 05-02-10          Home Medications    Prior to Admission medications   Medication Sig Start Date End Date Taking? Authorizing Provider  Blood Pressure Monitoring  (BLOOD PRESSURE CUFF) MISC 1 Device by Does not apply route once a week. 07/08/19   Shelly Bombard, MD  Prenatal MV-Min-FA-Omega-3 (PRENATAL GUMMIES/DHA & FA) 0.4-32.5 MG CHEW Chew 3 tablets by mouth daily. 06/16/19   Shelly Bombard, MD    Family History Family History  Problem Relation Age of Onset   Diabetes Mother    Hypertension Mother    Kidney disease Mother    Cancer Father     Social History Social History   Tobacco Use   Smoking status: Former    Types: Cigars    Quit date: 06/25/2019    Years since quitting: 3.6   Smokeless tobacco: Never  Vaping Use   Vaping Use: Never used  Substance Use Topics   Alcohol use: No   Drug use: No     Allergies   Patient has no known allergies.   Review of Systems Review of Systems   Physical Exam Triage Vital Signs ED Triage Vitals  Enc Vitals Group     BP 02/02/23 1238 (!) 146/95     Pulse Rate 02/02/23 1238 82     Resp 02/02/23 1238 16     Temp  02/02/23 1238 99 F (37.2 C)     Temp Source 02/02/23 1238 Oral     SpO2 02/02/23 1238 98 %     Weight --      Height --      Head Circumference --      Peak Flow --      Pain Score 02/02/23 1241 0     Pain Loc --      Pain Edu? --      Excl. in [place]? --    No data found.  Updated Vital Signs BP (!) 146/95 (BP Location: Left Arm)   Pulse 82   Temp 99 F (37.2 C) (Oral)   Resp 16   LMP 01/12/2023   SpO2 98%   Visual Acuity Right Eye Distance:   Left Eye Distance:   Bilateral Distance:    Right Eye Near:   Left Eye Near:    Bilateral Near:     Physical Exam Vitals reviewed.  Constitutional:      General: She is not in acute distress.    Appearance: She is not ill-appearing, toxic-appearing or diaphoretic.  HENT:     Nose: Congestion present.     Mouth/Throat:     Mouth: Mucous membranes are moist.     Pharynx: No oropharyngeal exudate or posterior oropharyngeal erythema.  Eyes:     Extraocular Movements: Extraocular movements intact.      Conjunctiva/sclera: Conjunctivae normal.     Pupils: Pupils are equal, round, and reactive to light.  Cardiovascular:     Rate and Rhythm: Normal rate and regular rhythm.     Heart sounds: No murmur heard. Pulmonary:     Effort: Pulmonary effort is normal. No respiratory distress.     Breath sounds: Normal breath sounds. No stridor. No wheezing, rhonchi or rales.  Musculoskeletal:     Cervical back: Neck supple.  Lymphadenopathy:     Cervical: No cervical adenopathy.  Skin:    Capillary Refill: Capillary refill takes less than 2 seconds.     Coloration: Skin is not pale.  Neurological:     General: No focal deficit present.     Mental Status: She is alert and oriented to person, place, and time.  Psychiatric:        Behavior: Behavior normal.      UC Treatments / Results  Labs (all labs ordered are listed, but only abnormal results are displayed) Labs Reviewed  SARS CORONAVIRUS 2 (TAT 6-24 HRS)    EKG   Radiology No results found.  Procedures Procedures (including critical care time)  Medications Ordered in UC Medications - No data to display  Initial Impression / Assessment and Plan / UC Course  I have reviewed the triage vital signs and the nursing notes.  Pertinent labs & imaging results that were available during my care of the patient were reviewed by me and considered in my medical decision making (see chart for details).        She is swabbed for COVID, and if positive she will know she needs to quarantine.  She will continue her symptomatic treatment, but now she is using Tylenol cold and flu. Final Clinical Impressions(s) / UC Diagnoses   Final diagnoses:  Viral URI     Discharge Instructions       You have been swabbed for COVID, and the test will result in the next 24 hours. Our staff will call you if positive. If the COVID test is positive, you should quarantine  until you are fever free for 24 hours and you are starting to feel better, and  then take added precautions for the next 5 days, such as physical distancing/wearing a mask and good hand hygiene/washing.  Continue taking the Tylenol cold and flu as needed.     ED Prescriptions   None    PDMP not reviewed this encounter.   Barrett Henle, MD 02/02/23 [phone]

## 2023-02-03 LAB — SARS CORONAVIRUS 2 (TAT 6-24 HRS): SARS Coronavirus 2: NEGATIVE

## 2024-05-28 NOTE — Progress Notes (Signed)
 [facility]    Date: 05/28/2024 Patient name: Danielle Short Date of birth: 07-11-1986   ASSESSMENT/PLAN:  Diagnoses and all orders for this visit: Anxiety - scheduling with therapist   Fatigue, unspecified type - improved but had issue getting her rx. Will send to different pharmacy and she will let us  know if continuing to have issues  -     vitamin D3, cholecalciferol, (OPTIMAL-D) 50,000 units CAPS; Take one capsule by mouth every 7 (seven) days. -     iron polysaccharides (FERREX 150) 150 MG capsule; Take one capsule (150 mg dose) by mouth daily.  Follow up in about 6 months (around 11/28/2024) for annual physical / recheck vit D / iron .    Patient expresses understanding of their current medications and use.  We discussed potential side effects, drug interactions, instructions for taking the medication, and the consequences of not taking it. Patient verbalized an understanding of these instructions. Patient is able to verbalize understanding of the care plan discussed today. Patient's medical and personal goals were discussed today.  Computer technology was used to create visit note. Consent from patient/care giver was obtained prior to its use.   Patient was instructed to return sooner if no improvement, and we discussed precautions to seek emergency medical care if condition worsens.    SUBJECTIVE:  Danielle Short is a 39 y.o. female that presents to clinic today regarding the following issues: Follow-up (4 wk f/u anxiety, fatigue)  At last office visit, she was seen for anxiety and fatigue.  Labs showed low iron levels and low vitamin D.  I sent in prescriptions for her but she says [facility] told her to just take the over-the-counter version.  She is not sure what dose she is taking.  She does feel better than she did last time but does not feel like she got the medications that we sent in.  She also saw her OB/GYN and they had said that her fibroids have  moved.  They are going to monitor this.  She is doing okay with her anxiety right now but is working on getting scheduled with a therapist.  She is also going to look into EAP programs through her work.    Reviewed and updated this visit by provider: None        Medications Patient's Medications  New Prescriptions   No medications on file  Previous Medications   No medications on file  Modified Medications   Modified Medication Previous Medication   IRON POLYSACCHARIDES (FERREX 150) 150 MG CAPSULE iron polysaccharides (FERREX 150) 150 MG capsule      Take one capsule (150 mg dose) by mouth daily.    Take one capsule (150 mg dose) by mouth daily.   VITAMIN D3, CHOLECALCIFEROL, (OPTIMAL-D) 50,000 UNITS CAPS vitamin D3, cholecalciferol, (OPTIMAL-D) 50,000 units CAPS      Take one capsule by mouth every 7 (seven) days.    Take one capsule by mouth every 7 (seven) days.  Discontinued Medications   No medications on file      ROS  Review of Systems  Constitutional:  Positive for fatigue.  HENT: Negative.    Eyes: Negative.   Respiratory: Negative.    Cardiovascular: Negative.   Gastrointestinal: Negative.   Endocrine: Negative.   Genitourinary: Negative.   Musculoskeletal: Negative.   Skin: Negative.   Allergic/Immunologic: Negative.   Neurological: Negative.   Hematological: Negative.   Psychiatric/Behavioral: Negative.       Review  of Systems - All other systems reviewed are negative except as noted above.   OBJECTIVE:  Vitals BP (!) 130/92 (Patient Position: Sitting)   Pulse 92   Temp 97.5 F (36.4 C) (Temporal)   Ht 5' 5 (1.651 m)   Wt 196 lb 9.6 oz (89.2 kg)   SpO2 99%   BMI 32.72 kg/m    PHYSICAL:  General Appearance: Alert, oriented and appears in no acute distress. Sitting comfortably in chair. Head: Normocephalic, without obvious abnormality, atraumatic.  Respiratory: Good air movement throughout. No accessory muscle use, increased work of  breathing or respiratory distress. Clear to auscultation in all lung fields. Normal effort of breathing, non-labored. No adventitious lung sounds including wheezes, rhonchi or crackles. Cardiovascular: Regular rate and rhythm, S1, S2 normal, no murmur. No heaves, lifts or thrills palpable. Extremities: No peripheral edema.  Skin: Warm, dry.  Psychiatric: Pleasant. Mood and affect normal. Not anxious or tearful. Neurological: AOx3. Gait normal.     Joesph HILARIO Cedar, PA-C  [facility]  05/28/2024 5:02 PM
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
## 2024-06-17 ENCOUNTER — Other Ambulatory Visit: Payer: Self-pay | Admitting: Obstetrics & Gynecology

## 2024-07-14 ENCOUNTER — Other Ambulatory Visit (HOSPITAL_COMMUNITY)
Admission: RE | Admit: 2024-07-14 | Discharge: 2024-07-14 | Disposition: A | Discharge status: Home / Self Care | Source: Ambulatory Visit | Attending: Obstetrics and Gynecology | Admitting: Obstetrics and Gynecology

## 2024-07-14 ENCOUNTER — Encounter: Payer: Self-pay | Admitting: Obstetrics and Gynecology

## 2024-07-14 ENCOUNTER — Ambulatory Visit: Payer: Self-pay | Admitting: Obstetrics and Gynecology

## 2024-07-14 VITALS — BP 139/86 | HR 80 | Ht 64.0 in | Wt 193.0 lb

## 2024-07-14 DIAGNOSIS — Z01411 Encounter for gynecological examination (general) (routine) with abnormal findings: Secondary | ICD-10-CM | POA: Insufficient documentation

## 2024-07-14 DIAGNOSIS — D259 Leiomyoma of uterus, unspecified: Secondary | ICD-10-CM

## 2024-07-14 NOTE — Progress Notes (Signed)
 Subjective:     Danielle Short is a 38 y.o. female P17 with LMP 06/28/24 and BMI 33 who is here for a comprehensive physical exam. The patient reports known fibroid uterus since 2021. She reports a monthly period heavy in flow, lasting at times up to 10 days. She was recently seen by Dr. Barbette and scheduled for an abdominal hysterectomy on 08/05/24. Due to cost, patient opted to transfer to a practise which accepts her Medicaid as secondary insurance. Patient with 3 previous cesarean section and bilateral tubal ligation. Patient is without any other complaints.  Past Medical History:  Diagnosis Date   Anemia    Chronic hypertension with superimposed preeclampsia 12/22/2019   Hypertension during pregnancy   Pre-eclampsia added to pre-existing hypertension 12/10/2019   Will need delivery at 37 weeks   Pregnancy affected by fetal growth restriction    Pregnancy induced hypertension    Uterine fibroids affecting pregnancy    Past Surgical History:  Procedure Laterality Date   CESAREAN SECTION     CESAREAN SECTION WITH BILATERAL TUBAL LIGATION N/A 12/27/2019   Procedure: CESAREAN SECTION WITH BILATERAL TUBAL LIGATION;  Surgeon: Izell Harari, MD;  Location: [facility];  Service: Obstetrics;  Laterality: N/A;   Family History  Problem Relation Age of Onset   Diabetes Mother    Hypertension Mother    Kidney disease Mother    Cancer Father      Social History   Socioeconomic History   Marital status: Married    Spouse name: Not on file   Number of children: 1   Years of education: Not on file   Highest education level: Not on file  Occupational History   Occupation: ross  Tobacco Use   Smoking status: Former    Types: Cigars    Quit date: 06/25/2019    Years since quitting: 5.0   Smokeless tobacco: Never  Vaping Use   Vaping status: Never Used  Substance and Sexual Activity   Alcohol use: No   Drug use: No   Sexual activity: Yes    Birth control/protection: None  Other Topics  Concern   Not on file  Social History Narrative   Not on file   Social Drivers of Health   Financial Resource Strain: Low Risk  (05/28/2024)   Received from [facility]   Overall Financial Resource Strain (CARDIA)    Difficulty of Paying Living Expenses: Not hard at all  Food Insecurity: No Food Insecurity (05/28/2024)   Received from [facility]   Hunger Vital Sign    Within the past 12 months, you worried that your food would run out before you got the money to buy more.: Never true    Within the past 12 months, the food you bought just didn't last and you didn't have money to get more.: Never true  Transportation Needs: No Transportation Needs (05/28/2024)   Received from [facility] - Transportation    Lack of Transportation (Medical): No    Lack of Transportation (Non-Medical): No  Physical Activity: Insufficiently Active (05/28/2024)   Received from [facility]   Exercise Vital Sign    On average, how many days per week do you engage in moderate to strenuous exercise (like a brisk walk)?: 1 day    On average, how many minutes do you engage in exercise at this level?: 60 min  Stress: Stress Concern Present (05/28/2024)   Received from [facility] -  Occupational Stress Questionnaire    Feeling of Stress : To some extent  Social Connections: Socially Integrated (05/28/2024)   Received from [facility]   Social Network    How would you rate your social network (family, work, friends)?: Good participation with social networks  Intimate Partner Violence: Not At Risk (05/28/2024)   Received from [facility]   HITS    Over the last 12 months how often did your partner physically hurt you?: Never    Over the last 12 months how often did your partner insult you or talk down to you?: Never    Over the last 12 months how often did your partner threaten you with physical harm?: Never    Over the last 12 months how often did your  partner scream or curse at you?: Never   Health Maintenance  Topic Date Due   Hepatitis B Vaccines 19-59 Average Risk (1 of 3 - 19+ 3-dose series) Never done   HPV VACCINES (1 - 3-dose SCDM series) Never done   Cervical Cancer Screening (HPV/Pap Cotest)  Never done   COVID-19 Vaccine (1 - 2024-25 season) Never done   INFLUENZA VACCINE  06/20/2024   DTaP/Tdap/Td (2 - Td or Tdap) 11/25/2029   Hepatitis C Screening  Completed   HIV Screening  Completed   Pneumococcal Vaccine  Aged Out   Meningococcal B Vaccine  Aged Out       Review of Systems Pertinent items noted in HPI and remainder of comprehensive ROS otherwise negative.   Objective:  Blood pressure 139/86, pulse 80, height 5' 4 (1.626 m), weight 193 lb (87.5 kg), last menstrual period 05/28/2024, unknown if currently breastfeeding.   GENERAL: Well-developed, well-nourished female in no acute distress.  ABDOMEN: Soft, nontender, nondistended. Palpable fibroid uterus at the level of umbilicus. PELVIC: Normal external female genitalia. Vagina is pink and rugated.  Normal discharge. Normal appearing cervix. Uterus is 18-weeks in size. No adnexal mass or tenderness. Chaperone present during the pelvic exam EXTREMITIES: No cyanosis, clubbing, or edema, 2+ distal pulses.   Ultrasound mentioned in Care Everywhere 8/11 office note Uterus 17x12x11 cm EMS 19 cm. 4 discrete fibroids noted. 6.7 cm, 9.8 cm, 3.2 cm and 1.9 cm.    Assessment:    Healthy female exam.      Plan:    Pap smear updated in preparation for hysterectomy Patient will be contacted with abnormal results Reviewed definitive treatment plan options involving removal of uterus, cervix and remaining portions of fallopian tubes. Patient verbalized understanding. Patient enquired whether she can have it done laparoscopically to ease her recovery and minimize length of recovery. Case discussed with Dr. Jeralyn, who agrees to see patient for robotic assisted vaginal  hysterectomy consultation. Patient understands that if not a candidate, plan will be to proceed with abdominal hysterectomy Patient scheduled on 08/01/24 with Dr. Jeralyn  See After Visit Summary for Counseling Recommendations

## 2024-07-14 NOTE — Progress Notes (Signed)
 Pt is in office to discuss Hysterectomy due to fibroids.  Pt has records under media - had u/s recently. Last pap 09/2021- normal.

## 2024-07-15 LAB — CYTOLOGY - PAP
Adequacy: ABSENT
Comment: NEGATIVE
Diagnosis: NEGATIVE
High risk HPV: NEGATIVE

## 2024-08-01 ENCOUNTER — Encounter: Payer: Self-pay | Admitting: Obstetrics and Gynecology

## 2024-08-01 ENCOUNTER — Ambulatory Visit (INDEPENDENT_AMBULATORY_CARE_PROVIDER_SITE_OTHER): Admitting: Obstetrics and Gynecology

## 2024-08-01 VITALS — BP 130/87 | HR 90 | Ht 66.0 in | Wt 194.0 lb

## 2024-08-01 DIAGNOSIS — D259 Leiomyoma of uterus, unspecified: Secondary | ICD-10-CM

## 2024-08-01 NOTE — Progress Notes (Signed)
 38 y.o. GYN presents for Surgical Consult  Last PAP 07/14/24 NILM

## 2024-08-01 NOTE — Progress Notes (Signed)
 GYNECOLOGY VISIT  Patient name: Danielle Short MRN [id number]  Date of birth: [birth year] Chief Complaint:   Consult (Surgical Consult)   History:  Discussed the use of AI scribe software for clinical note transcription with the patient, who gave verbal consent to proceed.  History of Present Illness Danielle Short is a 38 year old female with uterine fibroids who presents for a consultation regarding hysterectomy due to heavy menstrual bleeding and pressure.  She experiences heavy menstrual bleeding and pelvic pressure. Her menstrual cycles are prolonged, lasting up to ten days, with heavy bleeding at the beginning or end of the cycle. She uses both pads and tampons, but this month has relied solely on pads due to the heaviness of the bleeding. She also experiences the passage of large blood clots.  She has a history of heavy menstrual bleeding even before her pregnancies, which has worsened over time. She has had three prior cesarean sections and a tubal ligation, indicating she is done with pregnancies.  She is currently taking iron supplements for anemia, which was not described as 'super low'. She has difficulty swallowing pills and prefers liquid medications when possible. She has no history of high blood pressure outside of pregnancy, liver problems, or blood clots. She occasionally experiences migraines but did not report any auras.  She has previously explored alternative treatments for fibroids, such as artery stabilization.     The following portions of the patient's history were reviewed and updated as appropriate: allergies, current medications, past family history, past medical history, past social history, past surgical history and problem list.   Health Maintenance:   Last pap     Component Value Date/Time   DIAGPAP  07/14/2024 1445    - Negative for intraepithelial lesion or malignancy (NILM)   HPVHIGH Negative 07/14/2024 1445   ADEQPAP  07/14/2024 1445     Satisfactory for evaluation; transformation zone component ABSENT.    Health Maintenance  Topic Date Due   Hepatitis B Vaccine (1 of 3 - 19+ 3-dose series) Never done   HPV Vaccine (1 - 3-dose SCDM series) Never done   Flu Shot  06/20/2024   COVID-19 Vaccine (1 - 2024-25 season) Never done   Pap with HPV screening  07/14/2029   DTaP/Tdap/Td vaccine (2 - Td or Tdap) 11/25/2029   Hepatitis C Screening  Completed   HIV Screening  Completed   Pneumococcal Vaccine  Aged Out   Meningitis B Vaccine  Aged Out      Review of Systems:  Pertinent items are noted in HPI. Comprehensive review of systems was otherwise negative.   Objective:  Physical Exam BP 130/87   Pulse 90   Ht 5' 6 (1.676 m)   Wt 194 lb (88 kg)   LMP 07/25/2024 (Exact Date)   BMI 31.31 kg/m    Physical Exam Vitals and nursing note reviewed. Exam conducted with a chaperone present.  Constitutional:      Appearance: Normal appearance.  HENT:     Head: Normocephalic and atraumatic.  Pulmonary:     Effort: Pulmonary effort is normal.     Breath sounds: Normal breath sounds.  Abdominal:     Comments: Fundus ~4cm below the umbilicus, predominantly midline, slightly mobile  Genitourinary:    General: Normal vulva.     Exam position: Lithotomy position.     Vagina: Normal.     Cervix: Normal.     Comments: Small cervix deviated posteriorly and drawn up likely due to scar with ~  fingerbreadths on either side Skin:    General: Skin is warm and dry.  Neurological:     General: No focal deficit present.     Mental Status: She is alert.  Psychiatric:        Mood and Affect: Mood normal.        Behavior: Behavior normal.        Thought Content: Thought content normal.        Judgment: Judgment normal.        Assessment & Plan:   Assessment & Plan Uterine fibroids with abnormal uterine bleeding and bulk symptoms Chronic fibroids causing prolonged heavy menstrual bleeding and pressure sensation. Previous  cesarean sections may complicate surgical approach. Preference for laparoscopic hysterectomy due to faster recovery and less pain. Risks include conversion to open surgery, bleeding, infection, and pelvic organ injury. Non-oral hormonal therapy considered to manage bleeding until surgery. - Schedule laparoscopic hysterectomy with potential for conversion to open surgery. - pelvic examination to assess fibroid size and position - see exam above - Consider hormonal patch to manage bleeding until surgery. - Check recent A1c and thyroid function tests prior to surgery. - Discuss postoperative precautions including no heavy lifting and vaginal rest. - Sign hysterectomy consent form.    Carter Quarry, MD Minimally Invasive Gynecologic Surgery Center for [facility], [facility]

## 2024-08-05 ENCOUNTER — Inpatient Hospital Stay (HOSPITAL_COMMUNITY): Admit: 2024-08-05 | Admitting: Obstetrics & Gynecology

## 2024-08-05 SURGERY — HYSTERECTOMY, ABDOMINAL
Anesthesia: Choice

## 2024-09-03 ENCOUNTER — Telehealth: Payer: Self-pay

## 2024-09-03 NOTE — Telephone Encounter (Signed)
 Called patient to see if she's available for surgery w/ Dr. Jeralyn on 09/23/24. Patient mailbox is still full, unable to leave voicemail.

## 2024-09-18 ENCOUNTER — Telehealth: Payer: Self-pay

## 2024-09-18 NOTE — Telephone Encounter (Signed)
 I called patient to let her know Dr. Jeralyn schedule has opened for January. Patient is scheduled for 12/19/23 at [facility] hospital at 12:45 pm. Patient mailbox is full. I will send a mychart notification.

## 2024-12-16 ENCOUNTER — Encounter (HOSPITAL_COMMUNITY): Payer: Self-pay | Admitting: Obstetrics and Gynecology

## 2024-12-16 NOTE — Progress Notes (Signed)
 Spoke w/ via phone for pre-op interview--- Danielle Short needs dos----  CBC and T&S per protocol. Orders requested from surgeon Ajewole 12/16/24. UPT per anesthesia.       Short results------ COVID test -----patient states asymptomatic no test needed Arrive at -------0530 NPO after MN NO Solid Food.   Pre-Surgery Ensure or G2:  Med rec completed Medications to take morning of surgery -----NONE Diabetic medication -----  GLP1 agonist last dose: GLP1 instructions:  Patient instructed no nail polish to be worn day of surgery Patient instructed to bring photo id and insurance card day of surgery Patient aware to have Driver (ride ) / caregiver    for 24 hours after surgery - Husband Danielle Short Patient Special Instructions ----- Hibiclens  shower. If unable to get Hibiclens  shower with antibacterial soap night before. Pre-Op special Instructions -----  Patient verbalized understanding of instructions that were given at this phone interview. Patient denies chest pain, sob, fever, cough at the interview.

## 2024-12-18 ENCOUNTER — Other Ambulatory Visit: Payer: Self-pay

## 2024-12-18 ENCOUNTER — Encounter (HOSPITAL_COMMUNITY): Payer: Self-pay | Admitting: Physician Assistant

## 2024-12-18 ENCOUNTER — Encounter (HOSPITAL_COMMUNITY)
Admission: RE | Disposition: A | Discharge status: Home / Self Care | Payer: Self-pay | Source: Home / Self Care | Attending: Obstetrics and Gynecology

## 2024-12-18 ENCOUNTER — Encounter (HOSPITAL_COMMUNITY): Payer: Self-pay | Admitting: Obstetrics and Gynecology

## 2024-12-18 ENCOUNTER — Ambulatory Visit (HOSPITAL_COMMUNITY)
Admission: RE | Admit: 2024-12-18 | Discharge: 2024-12-18 | Disposition: A | Discharge status: Home / Self Care | Attending: Obstetrics and Gynecology | Admitting: Obstetrics and Gynecology

## 2024-12-18 ENCOUNTER — Other Ambulatory Visit (HOSPITAL_COMMUNITY): Payer: Self-pay

## 2024-12-18 DIAGNOSIS — Z87891 Personal history of nicotine dependence: Secondary | ICD-10-CM | POA: Insufficient documentation

## 2024-12-18 DIAGNOSIS — I10 Essential (primary) hypertension: Secondary | ICD-10-CM | POA: Diagnosis not present

## 2024-12-18 DIAGNOSIS — D259 Leiomyoma of uterus, unspecified: Secondary | ICD-10-CM | POA: Insufficient documentation

## 2024-12-18 DIAGNOSIS — Z01818 Encounter for other preprocedural examination: Secondary | ICD-10-CM

## 2024-12-18 DIAGNOSIS — N938 Other specified abnormal uterine and vaginal bleeding: Secondary | ICD-10-CM

## 2024-12-18 DIAGNOSIS — N72 Inflammatory disease of cervix uteri: Secondary | ICD-10-CM

## 2024-12-18 DIAGNOSIS — N888 Other specified noninflammatory disorders of cervix uteri: Secondary | ICD-10-CM | POA: Diagnosis not present

## 2024-12-18 DIAGNOSIS — N736 Female pelvic peritoneal adhesions (postinfective): Secondary | ICD-10-CM

## 2024-12-18 DIAGNOSIS — N7011 Chronic salpingitis: Secondary | ICD-10-CM | POA: Insufficient documentation

## 2024-12-18 DIAGNOSIS — N939 Abnormal uterine and vaginal bleeding, unspecified: Secondary | ICD-10-CM | POA: Diagnosis present

## 2024-12-18 DIAGNOSIS — Z59868 Other specified financial insecurity: Secondary | ICD-10-CM | POA: Insufficient documentation

## 2024-12-18 LAB — CBC
HCT: 33.9 % — ABNORMAL LOW (ref 36.0–46.0)
Hemoglobin: 9.8 g/dL — ABNORMAL LOW (ref 12.0–15.0)
MCH: 20.5 pg — ABNORMAL LOW (ref 26.0–34.0)
MCHC: 28.9 g/dL — ABNORMAL LOW (ref 30.0–36.0)
MCV: 70.9 fL — ABNORMAL LOW (ref 80.0–100.0)
Platelets: 312 10*3/uL (ref 150–400)
RBC: 4.78 MIL/uL (ref 3.87–5.11)
RDW: 21.2 % — ABNORMAL HIGH (ref 11.5–15.5)
WBC: 4.3 10*3/uL (ref 4.0–10.5)
nRBC: 0 % (ref 0.0–0.2)

## 2024-12-18 LAB — TYPE AND SCREEN
ABO/RH(D): A POS
Antibody Screen: NEGATIVE

## 2024-12-18 LAB — POCT PREGNANCY, URINE: Preg Test, Ur: NEGATIVE

## 2024-12-18 MED ORDER — LIDOCAINE 2% (20 MG/ML) 5 ML SYRINGE
INTRAMUSCULAR | Status: DC | PRN
  Administered 2024-12-18: 80 mg via INTRAVENOUS

## 2024-12-18 MED ORDER — PROPOFOL 1000 MG/100ML IV EMUL
INTRAVENOUS | Status: AC
  Filled 2024-12-18: qty 100

## 2024-12-18 MED ORDER — ALBUMIN HUMAN 5 % IV SOLN: INTRAVENOUS | Status: DC | PRN

## 2024-12-18 MED ORDER — ACETAMINOPHEN 10 MG/ML IV SOLN
INTRAVENOUS | Status: DC | PRN
  Administered 2024-12-18: 1000 mg via INTRAVENOUS

## 2024-12-18 MED ORDER — MIDAZOLAM HCL 5 MG/5ML IJ SOLN
INTRAMUSCULAR | Status: DC | PRN
  Administered 2024-12-18: 2 mg via INTRAVENOUS

## 2024-12-18 MED ORDER — AMISULPRIDE (ANTIEMETIC) 5 MG/2ML IV SOLN: 10.0000 mg | Freq: Once | INTRAVENOUS | Status: DC | PRN

## 2024-12-18 MED ORDER — OXYCODONE HCL 5 MG/5ML PO SOLN: 5.0000 mg | Freq: Once | ORAL | Status: AC | PRN

## 2024-12-18 MED ORDER — ACETAMINOPHEN CHILDRENS 160 MG/5ML PO SUSP
500.0000 mg | ORAL | 2 refills | Status: AC | PRN
  Filled 2024-12-18: qty 118, 2d supply, fill #0

## 2024-12-18 MED ORDER — ROCURONIUM BROMIDE 100 MG/10ML IV SOLN
INTRAVENOUS | Status: DC | PRN
  Administered 2024-12-18: 20 mg via INTRAVENOUS
  Administered 2024-12-18: 30 mg via INTRAVENOUS
  Administered 2024-12-18: 70 mg via INTRAVENOUS

## 2024-12-18 MED ORDER — HYDROMORPHONE HCL 1 MG/ML IJ SOLN
INTRAMUSCULAR | Status: AC
  Filled 2024-12-18: qty 0.5

## 2024-12-18 MED ORDER — PROPOFOL 10 MG/ML IV BOLUS
INTRAVENOUS | Status: DC | PRN
  Administered 2024-12-18: 150 mg via INTRAVENOUS

## 2024-12-18 MED ORDER — SCOPOLAMINE 1 MG/3DAYS TD PT72
MEDICATED_PATCH | TRANSDERMAL | Status: AC
  Filled 2024-12-18: qty 1

## 2024-12-18 MED ORDER — CHLORHEXIDINE GLUCONATE 0.12 % MT SOLN
15.0000 mL | Freq: Once | OROMUCOSAL | Status: AC
  Administered 2024-12-18: 15 mL via OROMUCOSAL

## 2024-12-18 MED ORDER — FENTANYL CITRATE (PF) 100 MCG/2ML IJ SOLN
INTRAMUSCULAR | Status: DC | PRN
  Administered 2024-12-18 (×4): 50 ug via INTRAVENOUS

## 2024-12-18 MED ORDER — BUPIVACAINE HCL (PF) 0.5 % IJ SOLN
INTRAMUSCULAR | Status: AC
  Filled 2024-12-18: qty 30

## 2024-12-18 MED ORDER — ORAL CARE MOUTH RINSE: 15.0000 mL | Freq: Once | OROMUCOSAL | Status: AC

## 2024-12-18 MED ORDER — GABAPENTIN 300 MG PO CAPS: 300.0000 mg | ORAL_CAPSULE | ORAL | Status: DC

## 2024-12-18 MED ORDER — HYDROMORPHONE HCL 1 MG/ML IJ SOLN
INTRAMUSCULAR | Status: DC | PRN
  Administered 2024-12-18: .5 mg via INTRAVENOUS

## 2024-12-18 MED ORDER — CEFAZOLIN SODIUM-DEXTROSE 2-4 GM/100ML-% IV SOLN
INTRAVENOUS | Status: AC
  Filled 2024-12-18: qty 100

## 2024-12-18 MED ORDER — FENTANYL CITRATE (PF) 250 MCG/5ML IJ SOLN
INTRAMUSCULAR | Status: AC
  Filled 2024-12-18: qty 5

## 2024-12-18 MED ORDER — OXYCODONE HCL 5 MG PO TABS
5.0000 mg | ORAL_TABLET | Freq: Once | ORAL | Status: AC | PRN
  Administered 2024-12-18: 5 mg via ORAL

## 2024-12-18 MED ORDER — SODIUM CHLORIDE 0.9 % IR SOLN
Status: DC | PRN
  Administered 2024-12-18: 1000 mL via INTRAVESICAL
  Administered 2024-12-18: 1000 mL

## 2024-12-18 MED ORDER — ONDANSETRON HCL 4 MG/2ML IJ SOLN
INTRAMUSCULAR | Status: DC | PRN
  Administered 2024-12-18: 4 mg via INTRAVENOUS

## 2024-12-18 MED ORDER — ONDANSETRON HCL 4 MG/2ML IJ SOLN: 4.0000 mg | Freq: Once | INTRAMUSCULAR | Status: DC | PRN

## 2024-12-18 MED ORDER — CEFAZOLIN SODIUM-DEXTROSE 2-4 GM/100ML-% IV SOLN
2.0000 g | INTRAVENOUS | Status: AC
  Administered 2024-12-18: 2 g via INTRAVENOUS

## 2024-12-18 MED ORDER — MIDAZOLAM HCL 2 MG/2ML IJ SOLN
INTRAMUSCULAR | Status: AC
  Filled 2024-12-18: qty 2

## 2024-12-18 MED ORDER — LACTATED RINGERS IV SOLN: INTRAVENOUS | Status: DC

## 2024-12-18 MED ORDER — KETOROLAC TROMETHAMINE 30 MG/ML IJ SOLN
INTRAMUSCULAR | Status: DC | PRN
  Administered 2024-12-18: 30 mg via INTRAVENOUS

## 2024-12-18 MED ORDER — PROPOFOL 500 MG/50ML IV EMUL
INTRAVENOUS | Status: DC | PRN
  Administered 2024-12-18: 200 ug/kg/min via INTRAVENOUS

## 2024-12-18 MED ORDER — LABETALOL HCL 5 MG/ML IV SOLN
INTRAVENOUS | Status: AC
  Filled 2024-12-18: qty 4

## 2024-12-18 MED ORDER — DEXAMETHASONE SOD PHOSPHATE PF 10 MG/ML IJ SOLN
INTRAMUSCULAR | Status: AC
  Filled 2024-12-18: qty 1

## 2024-12-18 MED ORDER — POLYETHYLENE GLYCOL 3350 17 GM/SCOOP PO POWD
17.0000 g | Freq: Every day | ORAL | 1 refills | Status: AC
  Filled 2024-12-18: qty 238, 14d supply, fill #0

## 2024-12-18 MED ORDER — KETOROLAC TROMETHAMINE 30 MG/ML IJ SOLN
INTRAMUSCULAR | Status: AC
  Filled 2024-12-18: qty 1

## 2024-12-18 MED ORDER — METRONIDAZOLE 500 MG/100ML IV SOLN
500.0000 mg | INTRAVENOUS | Status: AC
  Administered 2024-12-18: 250 mg via INTRAVENOUS

## 2024-12-18 MED ORDER — BUPIVACAINE LIPOSOME 1.3 % IJ SUSP
INTRAMUSCULAR | Status: AC
  Filled 2024-12-18: qty 20

## 2024-12-18 MED ORDER — 0.9 % SODIUM CHLORIDE (POUR BTL) OPTIME
TOPICAL | Status: DC | PRN
  Administered 2024-12-18: 1000 mL

## 2024-12-18 MED ORDER — DEXAMETHASONE SOD PHOSPHATE PF 10 MG/ML IJ SOLN
INTRAMUSCULAR | Status: DC | PRN
  Administered 2024-12-18: 8 mg via INTRAVENOUS

## 2024-12-18 MED ORDER — LABETALOL HCL 5 MG/ML IV SOLN
5.0000 mg | Freq: Once | INTRAVENOUS | Status: AC
  Administered 2024-12-18: 5 mg via INTRAVENOUS

## 2024-12-18 MED ORDER — SUGAMMADEX SODIUM 200 MG/2ML IV SOLN
INTRAVENOUS | Status: DC | PRN
  Administered 2024-12-18: 200 mg via INTRAVENOUS

## 2024-12-18 MED ORDER — POVIDONE-IODINE 10 % EX SWAB
2.0000 | Freq: Once | CUTANEOUS | Status: AC
  Administered 2024-12-18: 2 via TOPICAL

## 2024-12-18 MED ORDER — FLUORESCEIN SODIUM 10 % IV SOLN
INTRAVENOUS | Status: AC
  Filled 2024-12-18: qty 5

## 2024-12-18 MED ORDER — SCOPOLAMINE 1 MG/3DAYS TD PT72
1.0000 | MEDICATED_PATCH | TRANSDERMAL | Status: DC
  Administered 2024-12-18: 1 mg via TRANSDERMAL

## 2024-12-18 MED ORDER — ACETAMINOPHEN 500 MG PO TABS
ORAL_TABLET | ORAL | Status: AC
  Filled 2024-12-18: qty 2

## 2024-12-18 MED ORDER — ACETAMINOPHEN 500 MG PO TABS: 1000.0000 mg | ORAL_TABLET | ORAL | Status: DC

## 2024-12-18 MED ORDER — ACETAMINOPHEN 10 MG/ML IV SOLN
INTRAVENOUS | Status: AC
  Filled 2024-12-18: qty 100

## 2024-12-18 MED ORDER — PHENYLEPHRINE 80 MCG/ML (10ML) SYRINGE FOR IV PUSH (FOR BLOOD PRESSURE SUPPORT)
PREFILLED_SYRINGE | INTRAVENOUS | Status: AC
  Filled 2024-12-18: qty 10

## 2024-12-18 MED ORDER — CHLORHEXIDINE GLUCONATE 0.12 % MT SOLN
OROMUCOSAL | Status: AC
  Filled 2024-12-18: qty 15

## 2024-12-18 MED ORDER — HYDROMORPHONE HCL 1 MG/ML IJ SOLN: 0.2500 mg | INTRAMUSCULAR | Status: DC | PRN

## 2024-12-18 MED ORDER — OXYCODONE HCL 5 MG PO TABS
ORAL_TABLET | ORAL | Status: AC
  Filled 2024-12-18: qty 1

## 2024-12-18 MED ORDER — ACETAMINOPHEN 10 MG/ML IV SOLN: 1000.0000 mg | Freq: Once | INTRAVENOUS | Status: DC | PRN

## 2024-12-18 MED ORDER — ROCURONIUM BROMIDE 10 MG/ML (PF) SYRINGE
PREFILLED_SYRINGE | INTRAVENOUS | Status: AC
  Filled 2024-12-18: qty 20

## 2024-12-18 MED ORDER — BUPIVACAINE HCL (PF) 0.5 % IJ SOLN: INTRAMUSCULAR | Status: DC | PRN

## 2024-12-18 MED ORDER — OXYCODONE HCL 5 MG/5ML PO SOLN
5.0000 mg | ORAL | 0 refills | Status: AC | PRN
  Filled 2024-12-18: qty 100, 4d supply, fill #0

## 2024-12-18 MED ORDER — PHENYLEPHRINE 80 MCG/ML (10ML) SYRINGE FOR IV PUSH (FOR BLOOD PRESSURE SUPPORT)
PREFILLED_SYRINGE | INTRAVENOUS | Status: DC | PRN
  Administered 2024-12-18 (×2): 80 ug via INTRAVENOUS
  Administered 2024-12-18: 40 ug via INTRAVENOUS
  Administered 2024-12-18: 80 ug via INTRAVENOUS
  Administered 2024-12-18: 40 ug via INTRAVENOUS
  Administered 2024-12-18: 80 ug via INTRAVENOUS

## 2024-12-18 MED ORDER — IBUPROFEN 100 MG/5ML PO SUSP
600.0000 mg | Freq: Four times a day (QID) | ORAL | 3 refills | Status: AC | PRN
  Filled 2024-12-18 – 2024-12-25 (×2): qty 240, 2d supply, fill #0

## 2024-12-18 MED ORDER — GABAPENTIN 300 MG PO CAPS
ORAL_CAPSULE | ORAL | Status: AC
  Filled 2024-12-18: qty 1

## 2024-12-18 MED ORDER — METRONIDAZOLE 500 MG/100ML IV SOLN
INTRAVENOUS | Status: AC
  Filled 2024-12-18: qty 100

## 2024-12-18 MED ORDER — LIDOCAINE 2% (20 MG/ML) 5 ML SYRINGE
INTRAMUSCULAR | Status: AC
  Filled 2024-12-18: qty 5

## 2024-12-18 MED ORDER — DEXMEDETOMIDINE HCL IN NACL 80 MCG/20ML IV SOLN
INTRAVENOUS | Status: DC | PRN
  Administered 2024-12-18: 8 ug via INTRAVENOUS

## 2024-12-18 MED ORDER — ONDANSETRON HCL 4 MG/2ML IJ SOLN
INTRAMUSCULAR | Status: AC
  Filled 2024-12-18: qty 2

## 2024-12-18 MED ORDER — BUPIVACAINE LIPOSOME 1.3 % IJ SUSP
INTRAMUSCULAR | Status: DC | PRN
  Administered 2024-12-18: 40 mL

## 2024-12-18 NOTE — Transfer of Care (Signed)
 Immediate Anesthesia Transfer of Care Note  Patient: Danielle Short  Procedure(s) Performed: HYSTERECTOMY, TOTAL, ROBOT-ASSISTED (Abdomen) CYSTOSCOPY (Bladder)  Patient Location: PACU  Anesthesia Type:General  Level of Consciousness: drowsy  Airway & Oxygen Therapy: Patient Spontanous Breathing and Patient connected to face mask oxygen  Post-op Assessment: Report given to RN and Post -op Vital signs reviewed and stable  Post vital signs: Reviewed and stable  Last Vitals:  Vitals Value Taken Time  BP 112/72 12/18/24 13:15  Temp    Pulse 88 12/18/24 13:14  Resp 20 12/18/24 13:15  SpO2 100 % 12/18/24 13:14  Vitals shown include unfiled device data.  Last Pain:  Vitals:   12/18/24 0610  TempSrc: Oral  PainSc: 1       Patients Stated Pain Goal: 5 (12/18/24 0610)  Complications: No notable events documented.

## 2024-12-18 NOTE — H&P (Addendum)
 " OB/GYN Pre-Op History and Physical  Danielle Short is a 39 y.o. [id number] presenting for definitive surgical management of abnormal uterine bleeding. Note, has difficulty with swallogin pills and prefers liquid formulations. No [facility] contradication.   Normal pap 2025 A1c 5.6 TSH 1.546      Past Medical History:  Diagnosis Date   Anemia    Chronic hypertension with superimposed preeclampsia 12/22/2019   Hypertension during pregnancy   per pt no meds, stable   Pre-eclampsia added to pre-existing hypertension 12/10/2019   Will need delivery at 37 weeks   Pregnancy affected by fetal growth restriction    Pregnancy induced hypertension    Uterine fibroids affecting pregnancy     Past Surgical History:  Procedure Laterality Date   CESAREAN SECTION     CESAREAN SECTION WITH BILATERAL TUBAL LIGATION N/A 12/27/2019   Procedure: CESAREAN SECTION WITH BILATERAL TUBAL LIGATION;  Surgeon: Izell Harari, MD;  Location: [facility] LD ORS;  Service: Obstetrics;  Laterality: N/A;    OB History  Gravida Para Term Preterm AB Living  4 3 1 2 1 2   SAB IAB Ectopic Multiple Live Births   1  0 3    # Outcome Date GA Lbr Len/2nd Weight Sex Type Anes PTL Lv  4 Preterm 12/27/19 [redacted]w[redacted]d  1520 g M CS-LTranv Spinal  LIV  3 Term 03/25/11 [redacted]w[redacted]d   F CS-LTranv   LIV     Birth Comments: System Generated. Please review and update pregnancy details.  2 IAB 2011          1 Preterm 06/10/09 [redacted]w[redacted]d   CHRISTELLA Duran   ND    Obstetric Comments  Son passed away 04-30-10    Social History   Socioeconomic History   Marital status: Married    Spouse name: Not on file   Number of children: 1   Years of education: Not on file   Highest education level: Not on file  Occupational History   Occupation: ross  Tobacco Use   Smoking status: Former    Types: Cigars    Quit date: 06/25/2019    Years since quitting: 5.4   Smokeless tobacco: Never  Vaping Use   Vaping status: Never Used  Substance and Sexual Activity    Alcohol use: No   Drug use: No   Sexual activity: Yes    Birth control/protection: None  Other Topics Concern   Not on file  Social History Narrative   Not on file   Social Drivers of Health   Tobacco Use: Medium Risk (12/18/2024)   Patient History    Smoking Tobacco Use: Former    Smokeless Tobacco Use: Never    Passive Exposure: Not on file  Financial Resource Strain: Medium Risk (11/25/2024)   Received from [facility]   Overall Financial Resource Strain (CARDIA)    How hard is it for you to pay for the very basics like food, housing, medical care, and heating?: Somewhat hard  Food Insecurity: No Food Insecurity (11/25/2024)   Received from [facility]   Epic    Within the past 12 months, you worried that your food would run out before you got the money to buy more.: Never true    Within the past 12 months, the food you bought just didn't last and you didn't have money to get more.: Never true  Transportation Needs: No Transportation Needs (11/25/2024)   Received from [facility]    In the past 12 months, has  lack of transportation kept you from medical appointments or from getting medications?: No    In the past 12 months, has lack of transportation kept you from meetings, work, or from getting things needed for daily living?: No  Physical Activity: Insufficiently Active (05/28/2024)   Received from [facility]   Exercise Vital Sign    On average, how many days per week do you engage in moderate to strenuous exercise (like a brisk walk)?: 1 day    On average, how many minutes do you engage in exercise at this level?: 60 min  Stress: Stress Concern Present (05/28/2024)   Received from [facility] - Occupational Stress Questionnaire    Feeling of Stress : To some extent  Social Connections: Socially Integrated (05/28/2024)   Received from [facility]   Social Network    How would you rate your social network (family, work,  friends)?: Good participation with social networks  Depression (PHQ2-9): Low Risk (07/14/2024)   Depression (PHQ2-9)    PHQ-2 Score: 0  Alcohol Screen: Not on file  Housing: Low Risk (11/25/2024)   Received from [facility]    In the last 12 months, was there a time when you were not able to pay the mortgage or rent on time?: No    In the past 12 months, how many times have you moved where you were living?: 0    At any time in the past 12 months, were you homeless or living in a shelter (including now)?: No  Utilities: Not At Risk (11/25/2024)   Received from [facility]    In the past 12 months has the electric, gas, oil, or water company threatened to shut off services in your home?: No  Health Literacy: Not on file    Family History  Problem Relation Age of Onset   Diabetes Mother    Hypertension Mother    Kidney disease Mother    Cancer Father     Medications Prior to Admission  Medication Sig Dispense Refill Last Dose/Taking   ergocalciferol (VITAMIN D2) 1.25 MG (50000 UT) capsule Take 50,000 Units by mouth once a week.   Past Week   ferrous sulfate  325 (65 FE) MG EC tablet Take 325 mg by mouth daily with breakfast.   Past Week   Prenatal MV-Min-FA-Omega-3 (PRENATAL GUMMIES/DHA & FA) 0.4-32.5 MG CHEW Chew 3 tablets by mouth daily. (Patient not taking: Reported on 08/01/2024) 90 tablet 12     Allergies[1]  Review of Systems: Negative except for what is mentioned in HPI.     Physical Exam: BP (!) 139/96   Pulse 94   Temp 98.4 F (36.9 C) (Oral)   Resp 16   Ht 5' 6 (1.676 m)   Wt 88 kg   LMP 11/28/2024 (Approximate)   SpO2 100%   Breastfeeding No   BMI 31.31 kg/m  CONSTITUTIONAL: Well-developed, well-nourished and in no acute distress.  HENT:  Normocephalic, atraumatic, External right and left ear normal. Oropharynx is clear and moist EYES: Conjunctivae and EOM are normal. Pupils are equal, round, and reactive to light. No scleral icterus.  NECK:  Normal range of motion, supple, no masses SKIN: Skin is warm and dry. No rash noted. Not diaphoretic. No erythema. No pallor. NEUROLGIC: Alert and oriented to person, place, and time. Normal reflexes, muscle tone coordination. No cranial nerve deficit noted. PSYCHIATRIC: Normal mood and affect. Normal behavior. Normal judgment and thought  content. RESPIRATORY: Normal effort PELVIC: Deferred   Pertinent Labs/Studies:   Results for orders placed or performed during the hospital encounter of 12/18/24 (from the past 72 hours)  Pregnancy, urine POC     Status: None   Collection Time: 12/18/24  5:54 AM  Result Value Ref Range   Preg Test, Ur NEGATIVE NEGATIVE    Comment:        THE SENSITIVITY OF THIS METHODOLOGY IS >20 mIU/mL.        Assessment and Plan :Danielle Short is a 39 y.o. [id number] here for RA-TLH, BS, cysto.   Patient desires surgical management with RA-TLH, BS, cysto.  The risks of surgery were discussed in detail with the patient including but not limited to: bleeding which may require transfusion or reoperation; infection which may require prolonged hospitalization or re-hospitalization and antibiotic therapy; injury to bowel, bladder, ureters and major vessels or other surrounding organs which may lead to other procedures; formation of adhesions; need for additional procedures including laparotomy or subsequent procedures secondary to intraoperative injury or abnormal pathology; thromboembolic phenomenon; incisional problems and other postoperative or anesthesia complications.  Patient was told that the likelihood that her condition and symptoms will be treated effectively with this surgical management was high; the postoperative expectations were also discussed in detail. The patient also understands the alternative treatment options which were discussed in full. All questions were answered.      Eldean Klatt, M.D. Minimally Invasive Gynecologic Surgery and Pelvic Pain  Specialist Attending Obstetrician & Gynecologist, Faculty Practice Center for [facility], [facility]     [1] No Known Allergies  "

## 2024-12-18 NOTE — Anesthesia Procedure Notes (Signed)
 Procedure Name: Intubation Date/Time: 12/18/2024 7:44 AM  Performed by: Denton Niels CROME, CRNAPre-anesthesia Checklist: Patient identified, Emergency Drugs available, Suction available and Patient being monitored Patient Re-evaluated:Patient Re-evaluated prior to induction Oxygen Delivery Method: Circle system utilized Preoxygenation: Pre-oxygenation with 100% oxygen Induction Type: IV induction Ventilation: Mask ventilation without difficulty Laryngoscope Size: Mac and 3 Grade View: Grade II Tube type: Oral Number of attempts: 1 Airway Equipment and Method: Stylet Placement Confirmation: ETT inserted through vocal cords under direct vision, positive ETCO2 and breath sounds checked- equal and bilateral Secured at: 22.5 cm Tube secured with: Tape Dental Injury: Teeth and Oropharynx as per pre-operative assessment

## 2024-12-18 NOTE — Brief Op Note (Signed)
 12/18/2024  7:30 AM  12:38 PM  PATIENT:  Danielle Short  39 y.o. female  PRE-OPERATIVE DIAGNOSIS:  Fibroids  POST-OPERATIVE DIAGNOSIS:  Fibroids  PROCEDURE:  Procedures: HYSTERECTOMY, TOTAL, ROBOT-ASSISTED (N/A) CYSTOSCOPY (N/A)  SURGEON:  Surgeons and Role:    DEWAINE Jeralyn Crutch, MD - Primary  PHYSICIAN ASSISTANT: Jorene Moats, PA  ASSISTANTS: as noted above   ANESTHESIA:   general and laparoscopic bilateral TAP block  EBL:  200 mL   BLOOD ADMINISTERED:none  DRAINS: urinary foley catheter - removed at end of case   LOCAL MEDICATIONS USED:  BUPIVICAINE  and OTHER exparel   SPECIMEN:  Source of Specimen:  uterus, cervix, bilateral fallopian tubes (~8741h)  DISPOSITION OF SPECIMEN:  PATHOLOGY  COUNTS:  YES  TOURNIQUET:  * No tourniquets in log *  DICTATION: .Note written in EPIC  PLAN OF CARE: extended recovery  PATIENT DISPOSITION:  PACU - hemodynamically stable.   Delay start of Pharmacological VTE agent (>24hrs) due to surgical blood loss or risk of bleeding: not applicable

## 2024-12-18 NOTE — Anesthesia Postprocedure Evaluation (Signed)
"   Anesthesia Post Note  Patient: Danielle Short  Procedure(s) Performed: HYSTERECTOMY, TOTAL, ROBOT-ASSISTED (Abdomen) CYSTOSCOPY (Bladder)     Patient location during evaluation: PACU Anesthesia Type: General Level of consciousness: awake Pain management: pain level controlled Respiratory status: spontaneous breathing Cardiovascular status: blood pressure returned to baseline Postop Assessment: no apparent nausea or vomiting and adequate PO intake Anesthetic complications: no   No notable events documented.                [name]      "

## 2024-12-18 NOTE — Discharge Instructions (Addendum)
 Post Op Hysterectomy Instructions Please read the instructions below. Refer to these instructions for the next few weeks. These instructions provide you with general information on caring for yourself after surgery. Your caregiver may also give you specific instructions. While your treatment has been planned according to the most current medical practices available, unavoidable problems sometimes happen. If you have any problems or questions after you leave, please call your caregiver.  HOME CARE INSTRUCTIONS Healing will take time. You will have discomfort, tenderness, swelling and bruising at the operative site for a couple of weeks. This is normal and will get better as time goes on.  Only take over-the-counter or prescription medicines for pain, discomfort or fever as directed by your caregiver.  Do not take aspirin . It can cause bleeding.  Do not drive when taking pain medication.  Follow your caregivers advice regarding diet, exercise, lifting, driving and general activities.  Resume your usual diet as directed and allowed.  Get plenty of rest and sleep.  Do not douche, use tampons, or have sexual intercourse until your caregiver gives you permission. .  Take your temperature if you feel hot or flushed.  You may shower today when you get home.  No tub bath for one week.   Do not drink alcohol until you are not taking any narcotic pain medications.  Try to have someone home with you for a week or two to help with the household activities.   Be careful over the next two to three weeks with any activities at home that involve lifting, pushing, or pulling.  Listen to your body--if something feels uncomfortable to do, then don't do it. Make sure you and your family understands everything about your operation and recovery.  Walking up stairs is fine. Do not sign any legal documents until you feel normal again.  Keep all your follow-up appointments as recommended by your caregiver.   PLEASE CALL  THE OFFICE IF: There is swelling, redness or increasing pain in the wound area.  Pus is coming from the wound.  You notice a bad smell from the wound or surgical dressing.  You have pain, redness and swelling from the intravenous site.  The wound is breaking open (the edges are not staying together).   You develop pain or bleeding when you urinate.  You develop abnormal vaginal discharge.  You have any type of abnormal reaction or develop an allergy to your medication.  You need stronger pain medication for your pain   SEEK IMMEDIATE MEDICAL CARE: You develop a temperature of 100.5 or higher.  You develop abdominal pain.  You develop chest pain.  You develop shortness of breath.  You pass out.  You develop pain, swelling or redness of your leg.  You develop heavy vaginal bleeding with or without blood clots.   MEDICATIONS: Restart your regular medications BUT wait one week before restarting all vitamins and mineral supplements Use Motrin  800mg  every 8 hours for the next several days.   Take Tylenol  1000mg  every 8 hours for the next several days Use oxycodone  5 mg every 4-6 hours. Taking motrin  and tylenol  should help reduce how often you use oxycodone .  You may use an over the counter stool softener like Colace or Dulcolax to help with starting a bowel movement.  You can also use miralax  (polyethylene glycol). Start the day after you go home.  Warm liquids, fluids, and ambulation help too.  If you have not had a bowel movement in four days, you need to  call the office.  Post Anesthesia Home Care Instructions  Activity: Get plenty of rest for the remainder of the day. A responsible adult should stay with you for 24 hours following the procedure.  For the next 24 hours, DO NOT: -Drive a car -Advertising copywriter -Drink alcoholic beverages -Take any medication unless instructed by your physician -Make any legal decisions or sign important papers.  Meals: Start with liquid foods such  as gelatin or soup. Progress to regular foods as tolerated. Avoid greasy, spicy, heavy foods. If nausea and/or vomiting occur, drink only clear liquids until the nausea and/or vomiting subsides. Call your physician if vomiting continues.  Special Instructions/Symptoms: Your throat may feel dry or sore from the anesthesia or the breathing tube placed in your throat during surgery. If this causes discomfort, gargle with warm salt water. The discomfort should disappear within 24 hours.    Information for Discharge Teaching: EXPAREL  (bupivacaine  liposome injectable suspension)   Your surgeon gave you EXPAREL (bupivacaine ) in your surgical incision to help control your pain after surgery.  EXPAREL  is a local anesthetic that provides pain relief by numbing the tissue around the surgical site. EXPAREL  is designed to release pain medication over time and can control pain for up to 72 hours. Depending on how you respond to EXPAREL , you may require less pain medication during your recovery.  Possible side effects: Temporary loss of sensation or ability to move in the area where bupivacaine  was injected. Nausea, vomiting, constipation Rarely, numbness and tingling in your mouth or lips, lightheadedness, or anxiety may occur. Call your doctor right away if you think you may be experiencing any of these sensations, or if you have other questions regarding possible side effects.  Follow all other discharge instructions given to you by your surgeon or nurse. Eat a healthy diet and drink plenty of water or other fluids.  If you return to the hospital for any reason within 96 hours following the administration of EXPAREL , please inform your health care providers.

## 2024-12-18 NOTE — Anesthesia Preprocedure Evaluation (Signed)
"                                    Anesthesia Evaluation  Patient identified by MRN, date of birth, ID band Patient awake    Reviewed: Allergy & Precautions, NPO status , Patient's Chart, lab work & pertinent test results  History of Anesthesia Complications Negative for: history of anesthetic complications  Airway Mallampati: II  TM Distance: >3 FB Neck ROM: Full    Dental  (+) Teeth Intact, Dental Advisory Given   Pulmonary former smoker   breath sounds clear to auscultation       Cardiovascular hypertension (Pregnancy Induced),  Rhythm:Regular Rate:Normal     Neuro/Psych    GI/Hepatic   Endo/Other    Renal/GU      Musculoskeletal   Abdominal   Peds  Hematology  (+) Blood dyscrasia, anemia  IDA  Hgb 9.3, Plts 320K   Anesthesia Other Findings   Reproductive/Obstetrics  Uterine Fibroids                                Anesthesia Physical Anesthesia Plan  ASA: 2  Anesthesia Plan: General   Post-op Pain Management:    Induction: Intravenous  PONV Risk Score and Plan: 2 and Ondansetron , Dexamethasone , Propofol  infusion, Midazolam , Scopolamine  patch - Pre-op and Treatment may vary due to age or medical condition  Airway Management Planned: Oral ETT  Additional Equipment: None  Intra-op Plan:   Post-operative Plan: Extubation in OR  Informed Consent: I have reviewed the patients History and Physical, chart, labs and discussed the procedure including the risks, benefits and alternatives for the proposed anesthesia with the patient or authorized representative who has indicated his/her understanding and acceptance.     Dental advisory given  Plan Discussed with: CRNA  Anesthesia Plan Comments: (Plan for GETA, PIV x 2. Hgb 9.3 (microcytic) - history of IDA and heavy menses secondary to uterine fibroids/nature of surgery. Discussed risks with patient and concerns for possible blood transfusion if Hgb < 7. )         Anesthesia Quick Evaluation  "

## 2024-12-18 NOTE — Op Note (Signed)
 Joesph Sor PROCEDURE DATE: 12/18/2024  PREOPERATIVE DIAGNOSIS: abnormal uterine bleeding, fibroids  POSTOPERATIVE DIAGNOSIS: abnormal uterine bleeding, fibroids PROCEDURE:    robotic assisted total laparoscopic hysterectomy, bilateral salpingectomy, cystoscopy  SURGEON: Carter Quarry, MD ASSISTANT:  Jorene Moats, PA  An experienced assistant was required given the standard of surgical care given the complexity of the case.  This assistant was needed for exposure, dissection, suctioning, retraction, instrument exchange, and for overall help during the procedure.  INDICATIONS: 39 y.o. [id number] with AUB-L.  Risks of surgery were discussed with the patient including but not limited to: bleeding which may require transfusion; infection which may require antibiotics; injury to surrounding organs; need for additional procedures including laparotomy;  and other postoperative/anesthesia complications. Written informed consent was obtained.    FINDINGS:  Normal external genitalia, 20 wk size mobile uterus with abnormal contours.  Laparoscopically: normal upper abdominal survey, enlarged multi-fibroid uterus, surgically ligated bilateral fallopian tubes with filshie clips noted,  normal bilateral ovaries, bilaterarl ureters seen, adhesions of the anterior cul de sac, normal posterior cul de sac, small omental adhesion to the anterior abdominal wall  Cystoscopically: normal bladder wall without apparent injury, bilateral ureteral orifices, ejection of urine from bilateral ureteral orifices   ANESTHESIA: General, general and laparoscopic bilateral TAP block INTRAVENOUS FLUIDS:  1800 ml of LR ESTIMATED BLOOD LOSS:  200 ml URINE OUTPUT: 350 ml SPECIMENS: uterus, cervix, bilateral fallopian tubes, filshie clip x2 (1258g) COMPLICATIONS:  None immediate.   PROCEDURE: The risks, benefits, and alternatives of surgery were explained, understood, and accepted. Consents were signed. All questions were answered.  She was taken to the operating room and general anesthesia was applied without complication. She was placed in the dorsal lithotomy position and her abdomen and vagina were prepped and draped after she had been carefully positioned on the table. A bimanual exam revealed a 20 week size uterus that was somewhat mobile. Her adnexa were not enlarged. A Foley catheter was placed and it drained clear throughout the case. A speculum was placed and the cervix visualized. The cervix was measured and the uterus was sounded to 18 cm. A Rumi uterine manipulator was placed without difficulty, using a 12cm tip and 3.0 cm cup.  Gloves were changed and attention was turned to the abdomen. An 8mm incision was made in the LUQ and an optiview airseal trocar was introduced into the abdomen. The Entry was confirmed with low opening intraabdominal pressure and visualization and the abdomen was then insufflated. After good pneumoperitoneum was established, the abdomen was surveyed including the upper abdomen. Laparoscopic bilateral TAP block was completed with 1:1 solution of 0.5% bupivicaine and exparel . She was placed in Trendelenburg position and ports were placed in appropriate positions on her abdomen to allow maximum exposure during the robotic case. Specifically, trocars were placed supraumbilically, in the LLQ, RLQ, and RUQ under direct laparoscopic visualization. The robot was docked and I proceeded with a robotic portion of the case.  The pelvis was inspected and the uterus was found to be very enlarged. The anterior abdominal wall adhesion was taken down sharply with monopolar scissors and the omentum noted to be hemostatic.   Right round ligament was then identified, cauterized and divided. The left mesosalpinx was cauterized and divided and the left distal fallopian tube was passed off for specimen. The right distal fallopian tube segment was separated from the mesosalpinx and the tube segment passed off. The left  utero-ovarian ligament was cauterized and divided. The ovary was completely separated from the uterus  and the round ligament divided. The left sided anterior adhesions were taken down with scissors in layers until the anterior leaf was divided. The posterior leaf was further dissected and then divided. The anterior leaf incision was continued anteriorly until the midline lower uterine segment adhesion was encountered. Attention was turned to the right side.  The right utero-ovarian ligament was identified and divided. The leaves of the broad ligament were divided and the anterior leaf divided until the anterior adhesion encountered. At this time, the bladder was backfilled with 200cc of normal saline and the lower uterine segment adhesion was taken down with sharp and blunt dissection until the bladder was dissected away from the lower uterine segment. The bladder was then allowed to drain. The posterior leaf of the broad ligament was sharply divided on the right with care to avoid the underlying bowel. Attention returned to the left side where the bladder flap was completely dissected down to the level of the pubocervical fascia. The uterine vessels were identified and the vessels were cauterized, divided and lateralized to the cup edge. Attention turned to the right side where the same was carried out. Once the cervical cup was cleared, an anterior colpotomy was made on the left side and carried posteriorly, continued on the contralateral side, lateral to posterior, anteriorly and then completed posteriorly, following the blue outline of the Rumi manipulator. All pedicles were hemostatic.  The uterus was removed from the uterine manipulator. A 17cm Alexis bag was inserted through the vagina. The vaginal cuff was closed with v-loc suture.  Excellent hemostasis was noted throughout. The pelvis was irrigated. At this point, attempted to place the specimen into the bag but unable to due to limited mobility within the  pelvis. Attention was turned to the abdomen, the instruments were removed, the robot undocked and a 6cm skin incision was made on the superior edge of her old low transverse incision. The subcutaneous tissue and fascia were divided using scalpel and then electrocautery. The peritoneum was encountered and sharply entered with mayo scissors. The cervical end of the specimen was brought through the incision, an alexis wound retractor placed within the incision and the specimen was manually morcellated.  Intermittently the abdominal pressure was increased and the intraabdominal cavity inspected with the robotic camera with re-attempts to place the specimen in the bag which were unsuccessful. Eventually the entire specimen was removed, followed by the specimen bag and then the wound retractor. The fascia was closed with 0 vicryl in a continuous fashion and the subcutaneous layer closed with 3-0 vicryl in a continuous fashion. The abdomen was re-insufflated and the upper and lower intraabdominal cavity inspected while the patient was flat and in trendelenburg. The operative sites were hemostatic, no portions of specimen were noted to be remaining in the abdomen, both ovaries were noted to appear viable and the lower abdominal incision was well approximated and without intrabdominal contents. At this point I performed cystoscopy. The cystoscopy revealed urine ejection from both ureters.   The skin from all of the other ports was closed with 4-0 vicryl. The patient was then extubated and taken to recovery in stable condition.   Sponge, lap and needle counts were correct x 2.    Carter Quarry, MD Minimally Invasive Gynecologic Surgery  Obstetrics and Gynecology, [facility] for [facility], [facility] 12/18/2024

## 2024-12-19 ENCOUNTER — Encounter (HOSPITAL_COMMUNITY): Payer: Self-pay | Admitting: Obstetrics and Gynecology

## 2024-12-19 MED FILL — Metronidazole IV Soln 500 MG/100ML: INTRAVENOUS | Qty: 100 | Status: AC

## 2024-12-22 ENCOUNTER — Ambulatory Visit: Payer: Self-pay | Admitting: Obstetrics and Gynecology

## 2024-12-22 LAB — SURGICAL PATHOLOGY

## 2024-12-23 ENCOUNTER — Encounter: Payer: Self-pay | Admitting: Obstetrics and Gynecology

## 2024-12-24 ENCOUNTER — Encounter: Payer: Self-pay | Admitting: Obstetrics and Gynecology

## 2024-12-25 ENCOUNTER — Other Ambulatory Visit (HOSPITAL_COMMUNITY): Payer: Self-pay

## 2025-01-06 ENCOUNTER — Telehealth: Payer: Self-pay | Admitting: Physician Assistant

## 2025-02-11 ENCOUNTER — Encounter: Payer: Self-pay | Admitting: Obstetrics and Gynecology
